# Patient Record
Sex: Male | Born: 1950
Health system: Southern US, Community
[De-identification: ages and names within clinical notes are randomized; demographics above are authoritative.]

## PROBLEM LIST (undated history)

## (undated) DIAGNOSIS — I219 Acute myocardial infarction, unspecified: Secondary | ICD-10-CM

## (undated) DIAGNOSIS — G4730 Sleep apnea, unspecified: Secondary | ICD-10-CM

## (undated) DIAGNOSIS — N189 Chronic kidney disease, unspecified: Secondary | ICD-10-CM

## (undated) DIAGNOSIS — R911 Solitary pulmonary nodule: Secondary | ICD-10-CM

## (undated) DIAGNOSIS — I5022 Chronic systolic (congestive) heart failure: Secondary | ICD-10-CM

## (undated) DIAGNOSIS — I10 Essential (primary) hypertension: Secondary | ICD-10-CM

## (undated) DIAGNOSIS — E7800 Pure hypercholesterolemia, unspecified: Secondary | ICD-10-CM

## (undated) DIAGNOSIS — J449 Chronic obstructive pulmonary disease, unspecified: Secondary | ICD-10-CM

## (undated) DIAGNOSIS — I2510 Atherosclerotic heart disease of native coronary artery without angina pectoris: Secondary | ICD-10-CM

## (undated) DIAGNOSIS — H409 Unspecified glaucoma: Secondary | ICD-10-CM

## (undated) DIAGNOSIS — E119 Type 2 diabetes mellitus without complications: Secondary | ICD-10-CM

## (undated) DIAGNOSIS — K219 Gastro-esophageal reflux disease without esophagitis: Secondary | ICD-10-CM

## (undated) HISTORY — DX: Chronic kidney disease, unspecified: N18.9

## (undated) HISTORY — DX: Chronic systolic (congestive) heart failure: I50.22

## (undated) HISTORY — PX: BOWEL RESECTION: SHX1257

## (undated) HISTORY — PX: SHOULDER ARTHROSCOPY: SHX128

## (undated) HISTORY — PX: NASAL SEPTUM SURGERY: SHX37

## (undated) HISTORY — PX: HAND SURGERY: SHX662

## (undated) HISTORY — DX: Solitary pulmonary nodule: R91.1

## (undated) HISTORY — DX: Gastro-esophageal reflux disease without esophagitis: K21.9

## (undated) HISTORY — PX: CARDIAC CATHETERIZATION: SHX172

---

## 2008-09-22 ENCOUNTER — Encounter: Admission: RE | Admit: 2008-09-22 | Discharge: 2008-10-08 | Payer: Self-pay | Admitting: Orthopedic Surgery

## 2008-10-09 ENCOUNTER — Encounter: Admission: RE | Admit: 2008-10-09 | Discharge: 2009-01-06 | Payer: Self-pay | Admitting: Orthopedic Surgery

## 2008-11-11 ENCOUNTER — Inpatient Hospital Stay (HOSPITAL_COMMUNITY): Admission: EM | Admit: 2008-11-11 | Discharge: 2008-11-14 | Payer: Self-pay | Admitting: Emergency Medicine

## 2008-11-13 ENCOUNTER — Encounter (INDEPENDENT_AMBULATORY_CARE_PROVIDER_SITE_OTHER): Payer: Self-pay | Admitting: Cardiovascular Disease

## 2009-01-31 ENCOUNTER — Encounter: Payer: Self-pay | Admitting: Cardiology

## 2009-11-30 ENCOUNTER — Encounter: Payer: Self-pay | Admitting: Cardiology

## 2009-12-01 ENCOUNTER — Ambulatory Visit: Payer: Self-pay | Admitting: Cardiology

## 2010-01-21 ENCOUNTER — Ambulatory Visit: Admit: 2010-01-21 | Payer: Self-pay | Admitting: Cardiology

## 2010-01-21 ENCOUNTER — Encounter: Payer: Self-pay | Admitting: Cardiology

## 2010-01-21 DIAGNOSIS — R55 Syncope and collapse: Secondary | ICD-10-CM | POA: Insufficient documentation

## 2010-01-21 DIAGNOSIS — Z87891 Personal history of nicotine dependence: Secondary | ICD-10-CM | POA: Insufficient documentation

## 2010-01-21 DIAGNOSIS — J449 Chronic obstructive pulmonary disease, unspecified: Secondary | ICD-10-CM | POA: Insufficient documentation

## 2010-01-21 DIAGNOSIS — I152 Hypertension secondary to endocrine disorders: Secondary | ICD-10-CM | POA: Insufficient documentation

## 2010-01-21 DIAGNOSIS — Z720 Tobacco use: Secondary | ICD-10-CM | POA: Insufficient documentation

## 2010-01-21 DIAGNOSIS — I252 Old myocardial infarction: Secondary | ICD-10-CM | POA: Insufficient documentation

## 2010-01-21 DIAGNOSIS — E119 Type 2 diabetes mellitus without complications: Secondary | ICD-10-CM | POA: Insufficient documentation

## 2010-01-21 DIAGNOSIS — I2510 Atherosclerotic heart disease of native coronary artery without angina pectoris: Secondary | ICD-10-CM | POA: Insufficient documentation

## 2010-01-21 DIAGNOSIS — I25110 Atherosclerotic heart disease of native coronary artery with unstable angina pectoris: Secondary | ICD-10-CM | POA: Insufficient documentation

## 2010-01-21 DIAGNOSIS — J4489 Other specified chronic obstructive pulmonary disease: Secondary | ICD-10-CM | POA: Insufficient documentation

## 2010-01-21 DIAGNOSIS — E1159 Type 2 diabetes mellitus with other circulatory complications: Secondary | ICD-10-CM | POA: Insufficient documentation

## 2010-01-21 DIAGNOSIS — I10 Essential (primary) hypertension: Secondary | ICD-10-CM | POA: Insufficient documentation

## 2010-01-21 DIAGNOSIS — E118 Type 2 diabetes mellitus with unspecified complications: Secondary | ICD-10-CM | POA: Insufficient documentation

## 2010-02-10 NOTE — Medication Information (Signed)
Summary: [facility] D/C MEDICATION DISCHARGE ORDERS  [facility] D/[facility] MEDICATION DISCHARGE ORDERS   Imported By: Zachary George 01/21/2010 09:39:50  _____________________________________________________________________  External Attachment:    Type:   Image     Comment:   External Document

## 2010-02-10 NOTE — Letter (Signed)
Summary: [facility] D/C DR[facility]  [facility] D[facility]C DR[facility]   Imported By: Zachary George 01/21/2010 09:40:40  _____________________________________________________________________  External Attachment:    Type:   Image     Comment:   External Document

## 2010-04-13 LAB — CBC
HCT: 37.9 % — ABNORMAL LOW (ref 39.0–52.0)
HCT: 47.6 % (ref 39.0–52.0)
Hemoglobin: 13.2 g/dL (ref 13.0–17.0)
Hemoglobin: 14 g/dL (ref 13.0–17.0)
MCHC: 34.9 g/dL (ref 30.0–36.0)
MCHC: 34.9 g/dL (ref 30.0–36.0)
MCV: 95.1 fL (ref 78.0–100.0)
Platelets: 201 10*3/uL (ref 150–400)
RBC: 4.01 MIL/uL — ABNORMAL LOW (ref 4.22–5.81)
RBC: 4.24 MIL/uL (ref 4.22–5.81)
RDW: 13.5 % (ref 11.5–15.5)
RDW: 13.6 % (ref 11.5–15.5)
RDW: 13.8 % (ref 11.5–15.5)
WBC: 10 10*3/uL (ref 4.0–10.5)
WBC: 10.5 10*3/uL (ref 4.0–10.5)

## 2010-04-13 LAB — LIPID PANEL
HDL: 24 mg/dL — ABNORMAL LOW (ref >39)
Total CHOL/HDL Ratio: 6.5 RATIO
Triglycerides: 174 mg/dL — ABNORMAL HIGH (ref <150)
VLDL: 35 mg/dL (ref 0–40)

## 2010-04-13 LAB — GLUCOSE, CAPILLARY: Glucose-Capillary: 105 mg/dL — ABNORMAL HIGH (ref 70–99)

## 2010-04-13 LAB — DIFFERENTIAL
Basophils Absolute: 0.1 10*3/uL (ref 0.0–0.1)
Basophils Relative: 1 % (ref 0–1)
Eosinophils Absolute: 0.2 10*3/uL (ref 0.0–0.7)
Eosinophils Relative: 2 % (ref 0–5)
Neutrophils Relative %: 63 % (ref 43–77)

## 2010-04-13 LAB — COMPREHENSIVE METABOLIC PANEL
ALT: 17 U/L (ref 0–53)
AST: 30 U/L (ref 0–37)
Alkaline Phosphatase: 69 U/L (ref 39–117)
Calcium: 8.7 mg/dL (ref 8.4–10.5)
GFR calc Af Amer: >60 mL/min (ref >60)
Glucose, Bld: 102 mg/dL — ABNORMAL HIGH (ref 70–99)
Potassium: 3.8 mEq/L (ref 3.5–5.1)
Sodium: 141 mEq/L (ref 135–145)
Total Protein: 6.1 g/dL (ref 6.0–8.3)

## 2010-04-13 LAB — BASIC METABOLIC PANEL
BUN: 16 mg/dL (ref 6–23)
CO2: 25 mEq/L (ref 19–32)
Calcium: 8.1 mg/dL — ABNORMAL LOW (ref 8.4–10.5)
Chloride: 104 mEq/L (ref 96–112)
Creatinine, Ser: 1.13 mg/dL (ref 0.4–1.5)
GFR calc Af Amer: >60 mL/min (ref >60)
GFR calc non Af Amer: >60 mL/min (ref >60)
Glucose, Bld: 104 mg/dL — ABNORMAL HIGH (ref 70–99)
Glucose, Bld: 129 mg/dL — ABNORMAL HIGH (ref 70–99)
Potassium: 3.5 mEq/L (ref 3.5–5.1)
Sodium: 134 mEq/L — ABNORMAL LOW (ref 135–145)

## 2010-04-13 LAB — CARDIAC PANEL(CRET KIN+CKTOT+MB+TROPI)
CK, MB: 20.6 ng/mL — ABNORMAL HIGH (ref 0.3–4.0)
CK, MB: 8 ng/mL — ABNORMAL HIGH (ref 0.3–4.0)
Relative Index: 7.6 — ABNORMAL HIGH (ref 0.0–2.5)
Total CK: 105 U/L (ref 7–232)
Total CK: 202 U/L (ref 7–232)

## 2010-04-13 LAB — POCT CARDIAC MARKERS
CKMB, poc: 6.4 ng/mL (ref 1.0–8.0)
Myoglobin, poc: 335 ng/mL (ref 12–200)
Troponin i, poc: 0.2 ng/mL — ABNORMAL HIGH (ref 0.00–0.09)

## 2010-04-13 LAB — BRAIN NATRIURETIC PEPTIDE: Pro B Natriuretic peptide (BNP): 61 pg/mL (ref 0.0–100.0)

## 2010-04-13 LAB — PROTIME-INR
INR: 1.11 (ref 0.00–1.49)
Prothrombin Time: 14.2 seconds (ref 11.6–15.2)

## 2010-04-13 LAB — CK TOTAL AND CKMB (NOT AT ARMC)
CK, MB: 11.9 ng/mL — ABNORMAL HIGH (ref 0.3–4.0)
Relative Index: 9.6 — ABNORMAL HIGH (ref 0.0–2.5)

## 2010-04-13 LAB — MAGNESIUM: Magnesium: 2.2 mg/dL (ref 1.5–2.5)

## 2010-04-13 LAB — TSH: TSH: 2.878 u[IU]/mL (ref 0.350–4.500)

## 2011-03-27 DIAGNOSIS — L089 Local infection of the skin and subcutaneous tissue, unspecified: Secondary | ICD-10-CM | POA: Insufficient documentation

## 2013-06-10 ENCOUNTER — Inpatient Hospital Stay (HOSPITAL_COMMUNITY): Admission: RE | Admit: 2013-06-10 | Payer: Self-pay | Source: Ambulatory Visit

## 2013-06-24 NOTE — Patient Instructions (Addendum)
Your procedure is scheduled on: 07/01/2013  [facility] at  930  AM.  Call this number if you have problems the morning of surgery: [phone]   Do not eat food or drink liquids :After Midnight.      Take these medicines the morning of surgery with A SIP OF WATER: Interview nurse will call you about which medicines to take morning of surgery once the pharmacy technician calls you to get your list of medications.   Do not wear jewelry, make-up or nail polish.  Do not wear lotions, powders, or perfumes.   Do not shave 48 hours prior to surgery.  Do not bring valuables to the hospital.  Contacts, dentures or bridgework may not be worn into surgery.  Leave suitcase in the car. After surgery it may be brought to your room.  For patients admitted to the hospital, checkout time is 11:00 AM the day of discharge.   Patients discharged the day of surgery will not be allowed to drive home.  :     Please read over the following fact sheets that you were given: Coughing and Deep Breathing, Surgical Site Infection Prevention, Anesthesia Post-op Instructions and Care and Recovery After Surgery    Cataract A cataract is a clouding of the lens of the eye. When a lens becomes cloudy, vision is reduced based on the degree and nature of the clouding. Many cataracts reduce vision to some degree. Some cataracts make people more near-sighted as they develop. Other cataracts increase glare. Cataracts that are ignored and become worse can sometimes look white. The white color can be seen through the pupil. CAUSES   Aging. However, cataracts may occur at any age, even in newborns.   Certain drugs.   Trauma to the eye.   Certain diseases such as diabetes.   Specific eye diseases such as chronic inflammation inside the eye or a sudden attack of a rare form of glaucoma.   Inherited or acquired medical problems.  SYMPTOMS   Gradual, progressive drop in vision in the affected eye.   Severe, rapid  visual loss. This most often happens when trauma is the cause.  DIAGNOSIS  To detect a cataract, an eye doctor examines the lens. Cataracts are best diagnosed with an exam of the eyes with the pupils enlarged (dilated) by drops.  TREATMENT  For an early cataract, vision may improve by using different eyeglasses or stronger lighting. If that does not help your vision, surgery is the only effective treatment. A cataract needs to be surgically removed when vision loss interferes with your everyday activities, such as driving, reading, or watching TV. A cataract may also have to be removed if it prevents examination or treatment of another eye problem. Surgery removes the cloudy lens and usually replaces it with a substitute lens (intraocular lens, IOL).  At a time when both you and your doctor agree, the cataract will be surgically removed. If you have cataracts in both eyes, only one is usually removed at a time. This allows the operated eye to heal and be out of danger from any possible problems after surgery (such as infection or poor wound healing). In rare cases, a cataract may be doing damage to your eye. In these cases, your caregiver may advise surgical removal right away. The vast majority of people who have cataract surgery have better vision afterward. HOME CARE INSTRUCTIONS  If you are not planning surgery, you may be asked to do the following:  Use  different eyeglasses.   Use stronger or brighter lighting.   Ask your eye doctor about reducing your medicine dose or changing medicines if it is thought that a medicine caused your cataract. Changing medicines does not make the cataract go away on its own.   Become familiar with your surroundings. Poor vision can lead to injury. Avoid bumping into things on the affected side. You are at a higher risk for tripping or falling.   Exercise extreme care when driving or operating machinery.   Wear sunglasses if you are sensitive to bright light or  experiencing problems with glare.  SEEK IMMEDIATE MEDICAL CARE IF:   You have a worsening or sudden vision loss.   You notice redness, swelling, or increasing pain in the eye.   You have a fever.  Document Released: 12/26/2004 Document Revised: 12/15/2010 Document Reviewed: 08/19/2010 [facility] Patient Information 2012 [place].PATIENT INSTRUCTIONS POST-ANESTHESIA  IMMEDIATELY FOLLOWING SURGERY:  Do not drive or operate machinery for the first twenty four hours after surgery.  Do not make any important decisions for twenty four hours after surgery or while taking narcotic pain medications or sedatives.  If you develop intractable nausea and vomiting or a severe headache please notify your doctor immediately.  FOLLOW-UP:  Please make an appointment with your surgeon as instructed. You do not need to follow up with anesthesia unless specifically instructed to do so.  WOUND CARE INSTRUCTIONS (if applicable):  Keep a dry clean dressing on the anesthesia/puncture wound site if there is drainage.  Once the wound has quit draining you may leave it open to air.  Generally you should leave the bandage intact for twenty four hours unless there is drainage.  If the epidural site drains for more than 36-48 hours please call the anesthesia department.  QUESTIONS?:  Please feel free to call your physician or the hospital operator if you have any questions, and they will be happy to assist you.

## 2013-06-26 ENCOUNTER — Encounter (HOSPITAL_COMMUNITY): Payer: Self-pay | Admitting: Pharmacy Technician

## 2013-06-26 ENCOUNTER — Other Ambulatory Visit: Payer: Self-pay

## 2013-06-26 ENCOUNTER — Encounter (HOSPITAL_COMMUNITY)
Admission: RE | Admit: 2013-06-26 | Discharge: 2013-06-26 | Disposition: A | Discharge status: Home / Self Care | Payer: Medicare HMO | Source: Ambulatory Visit | Attending: Ophthalmology | Admitting: Ophthalmology

## 2013-06-26 ENCOUNTER — Encounter (HOSPITAL_COMMUNITY): Payer: Self-pay

## 2013-06-26 DIAGNOSIS — Z01812 Encounter for preprocedural laboratory examination: Secondary | ICD-10-CM | POA: Insufficient documentation

## 2013-06-26 DIAGNOSIS — Z01810 Encounter for preprocedural cardiovascular examination: Secondary | ICD-10-CM | POA: Insufficient documentation

## 2013-06-26 HISTORY — DX: Atherosclerotic heart disease of native coronary artery without angina pectoris: I25.10

## 2013-06-26 HISTORY — DX: Unspecified glaucoma: H40.9

## 2013-06-26 HISTORY — DX: Acute myocardial infarction, unspecified: I21.9

## 2013-06-26 HISTORY — DX: Chronic obstructive pulmonary disease, unspecified: J44.9

## 2013-06-26 HISTORY — DX: Type 2 diabetes mellitus without complications: E11.9

## 2013-06-26 HISTORY — DX: Essential (primary) hypertension: I10

## 2013-06-26 HISTORY — DX: Sleep apnea, unspecified: G47.30

## 2013-06-26 HISTORY — DX: Pure hypercholesterolemia, unspecified: E78.00

## 2013-06-26 LAB — BASIC METABOLIC PANEL
BUN: 17 mg/dL (ref 6–23)
CALCIUM: 9.2 mg/dL (ref 8.4–10.5)
CO2: 27 mEq/L (ref 19–32)
Chloride: 101 mEq/L (ref 96–112)
Creatinine, Ser: 1.04 mg/dL (ref 0.50–1.35)
GFR calc Af Amer: 86 mL/min — ABNORMAL LOW (ref >90)
GFR, EST NON AFRICAN AMERICAN: 74 mL/min — AB (ref >90)
GLUCOSE: 214 mg/dL — AB (ref 70–99)
Potassium: 4.6 mEq/L (ref 3.7–5.3)
Sodium: 140 mEq/L (ref 137–147)

## 2013-06-26 LAB — HEMOGLOBIN AND HEMATOCRIT, BLOOD
HCT: 43.1 % (ref 39.0–52.0)
Hemoglobin: 14.6 g/dL (ref 13.0–17.0)

## 2013-06-26 NOTE — Progress Notes (Signed)
06/26/13 1055  OBSTRUCTIVE SLEEP APNEA  Have you ever been diagnosed with sleep apnea through a sleep study? No (Pt has never had a sleep study, but pt said a [facility] doctor told him once they thought he had sleep apnea)  Do you snore loudly (loud enough to be heard through closed doors)?  1  Do you often feel tired, fatigued, or sleepy during the daytime? 1  Has anyone observed you stop breathing during your sleep? 1  Do you have, or are you being treated for high blood pressure? 1  BMI more than 35 kg/m2? 1  Age over 65 years old? 1  Neck circumference greater than 40 cm/16 inches? 0  Gender: 1  Obstructive Sleep Apnea Score 7  Score 4 or greater  Results sent to PCP

## 2013-07-01 ENCOUNTER — Encounter (HOSPITAL_COMMUNITY)
Admission: RE | Disposition: A | Discharge status: Home / Self Care | Payer: Self-pay | Source: Ambulatory Visit | Attending: Ophthalmology

## 2013-07-01 ENCOUNTER — Encounter (HOSPITAL_COMMUNITY): Payer: Medicare HMO | Admitting: Anesthesiology

## 2013-07-01 ENCOUNTER — Ambulatory Visit (HOSPITAL_COMMUNITY): Payer: Medicare HMO | Admitting: Anesthesiology

## 2013-07-01 ENCOUNTER — Ambulatory Visit (HOSPITAL_COMMUNITY)
Admission: RE | Admit: 2013-07-01 | Discharge: 2013-07-01 | Disposition: A | Discharge status: Home / Self Care | Payer: Medicare HMO | Source: Ambulatory Visit | Attending: Ophthalmology | Admitting: Ophthalmology

## 2013-07-01 ENCOUNTER — Encounter (HOSPITAL_COMMUNITY): Payer: Self-pay | Admitting: *Deleted

## 2013-07-01 DIAGNOSIS — Z79899 Other long term (current) drug therapy: Secondary | ICD-10-CM | POA: Insufficient documentation

## 2013-07-01 DIAGNOSIS — Z794 Long term (current) use of insulin: Secondary | ICD-10-CM | POA: Insufficient documentation

## 2013-07-01 DIAGNOSIS — E119 Type 2 diabetes mellitus without complications: Secondary | ICD-10-CM | POA: Insufficient documentation

## 2013-07-01 DIAGNOSIS — J4489 Other specified chronic obstructive pulmonary disease: Secondary | ICD-10-CM | POA: Insufficient documentation

## 2013-07-01 DIAGNOSIS — I10 Essential (primary) hypertension: Secondary | ICD-10-CM | POA: Insufficient documentation

## 2013-07-01 DIAGNOSIS — Z87891 Personal history of nicotine dependence: Secondary | ICD-10-CM | POA: Insufficient documentation

## 2013-07-01 DIAGNOSIS — Z7982 Long term (current) use of aspirin: Secondary | ICD-10-CM | POA: Insufficient documentation

## 2013-07-01 DIAGNOSIS — J449 Chronic obstructive pulmonary disease, unspecified: Secondary | ICD-10-CM | POA: Insufficient documentation

## 2013-07-01 DIAGNOSIS — H2510 Age-related nuclear cataract, unspecified eye: Secondary | ICD-10-CM | POA: Insufficient documentation

## 2013-07-01 HISTORY — PX: CATARACT EXTRACTION W/PHACO: SHX586

## 2013-07-01 LAB — GLUCOSE, CAPILLARY: Glucose-Capillary: 249 mg/dL — ABNORMAL HIGH (ref 70–99)

## 2013-07-01 SURGERY — PHACOEMULSIFICATION, CATARACT, WITH IOL INSERTION
Anesthesia: Monitor Anesthesia Care | Site: Eye | Laterality: Left

## 2013-07-01 MED ORDER — TETRACAINE HCL 0.5 % OP SOLN
1.0000 [drp] | OPHTHALMIC | Status: AC
  Administered 2013-07-01 (×3): 1 [drp] via OPHTHALMIC

## 2013-07-01 MED ORDER — CYCLOPENTOLATE-PHENYLEPHRINE OP SOLN OPTIME - NO CHARGE
OPHTHALMIC | Status: AC
  Filled 2013-07-01: qty 2

## 2013-07-01 MED ORDER — FENTANYL CITRATE 0.05 MG/ML IJ SOLN
INTRAMUSCULAR | Status: AC
  Filled 2013-07-01: qty 2

## 2013-07-01 MED ORDER — FENTANYL CITRATE 0.05 MG/ML IJ SOLN
25.0000 ug | INTRAMUSCULAR | Status: AC
  Administered 2013-07-01 (×2): 25 ug via INTRAVENOUS

## 2013-07-01 MED ORDER — MIDAZOLAM HCL 2 MG/2ML IJ SOLN
1.0000 mg | INTRAMUSCULAR | Status: DC | PRN
  Administered 2013-07-01: 2 mg via INTRAVENOUS

## 2013-07-01 MED ORDER — KETOROLAC TROMETHAMINE 0.5 % OP SOLN
OPHTHALMIC | Status: AC
  Filled 2013-07-01: qty 5

## 2013-07-01 MED ORDER — LACTATED RINGERS IV SOLN
INTRAVENOUS | Status: DC
  Administered 2013-07-01: 11:00:00 via INTRAVENOUS

## 2013-07-01 MED ORDER — BSS IO SOLN
INTRAOCULAR | Status: DC | PRN
  Administered 2013-07-01: 11:00:00

## 2013-07-01 MED ORDER — CYCLOPENTOLATE-PHENYLEPHRINE 0.2-1 % OP SOLN
1.0000 [drp] | OPHTHALMIC | Status: AC
  Administered 2013-07-01 (×3): 1 [drp] via OPHTHALMIC

## 2013-07-01 MED ORDER — PROVISC 10 MG/ML IO SOLN
INTRAOCULAR | Status: DC | PRN
  Administered 2013-07-01: 0.85 mL via INTRAOCULAR

## 2013-07-01 MED ORDER — MIDAZOLAM HCL 2 MG/2ML IJ SOLN
INTRAMUSCULAR | Status: AC
  Filled 2013-07-01: qty 2

## 2013-07-01 MED ORDER — TETRACAINE 0.5 % OP SOLN OPTIME - NO CHARGE
OPHTHALMIC | Status: DC | PRN
  Administered 2013-07-01: 2 [drp] via OPHTHALMIC

## 2013-07-01 MED ORDER — PHENYLEPHRINE HCL 2.5 % OP SOLN
1.0000 [drp] | OPHTHALMIC | Status: AC
  Administered 2013-07-01 (×3): 1 [drp] via OPHTHALMIC

## 2013-07-01 MED ORDER — TETRACAINE HCL 0.5 % OP SOLN
OPHTHALMIC | Status: AC
  Filled 2013-07-01: qty 2

## 2013-07-01 MED ORDER — KETOROLAC TROMETHAMINE 0.5 % OP SOLN
1.0000 [drp] | OPHTHALMIC | Status: AC
  Administered 2013-07-01 (×3): 1 [drp] via OPHTHALMIC

## 2013-07-01 MED ORDER — PHENYLEPHRINE HCL 2.5 % OP SOLN
OPHTHALMIC | Status: AC
  Filled 2013-07-01: qty 15

## 2013-07-01 MED ORDER — BSS IO SOLN
INTRAOCULAR | Status: DC | PRN
Start: 2013-07-01 — End: 2013-07-01
  Administered 2013-07-01: 15 mL via INTRAOCULAR

## 2013-07-01 MED ORDER — EPINEPHRINE HCL 1 MG/ML IJ SOLN
INTRAMUSCULAR | Status: AC
  Filled 2013-07-01: qty 1

## 2013-07-01 SURGICAL SUPPLY — 25 items
CAPSULAR TENSION RING-AMO (OPHTHALMIC RELATED) IMPLANT
CLOTH BEACON ORANGE TIMEOUT ST (SAFETY) ×2 IMPLANT
EYE SHIELD UNIVERSAL CLEAR (GAUZE/BANDAGES/DRESSINGS) ×2 IMPLANT
GLOVE BIO SURGEON STRL SZ 6.5 (GLOVE) ×2 IMPLANT
GLOVE BIOGEL PI IND STRL 6.5 (GLOVE) ×1 IMPLANT
GLOVE BIOGEL PI INDICATOR 6.5 (GLOVE) ×1
GLOVE ECLIPSE 6.5 STRL STRAW (GLOVE) IMPLANT
GLOVE ECLIPSE 7.0 STRL STRAW (GLOVE) IMPLANT
GLOVE EXAM NITRILE LRG STRL (GLOVE) IMPLANT
GLOVE EXAM NITRILE MD LF STRL (GLOVE) IMPLANT
GLOVE SKINSENSE NS SZ6.5 (GLOVE)
GLOVE SKINSENSE STRL SZ6.5 (GLOVE) IMPLANT
HEALON 5 0.6 ML (INTRAOCULAR LENS) IMPLANT
KIT VITRECTOMY (OPHTHALMIC RELATED) IMPLANT
PAD ARMBOARD 7.5X6 YLW CONV (MISCELLANEOUS) ×2 IMPLANT
PROC W NO LENS (INTRAOCULAR LENS)
PROC W SPEC LENS (INTRAOCULAR LENS)
PROCESS W NO LENS (INTRAOCULAR LENS) IMPLANT
PROCESS W SPEC LENS (INTRAOCULAR LENS) IMPLANT
RING MALYGIN (MISCELLANEOUS) IMPLANT
SIGHTPATH CAT PROC W REG LENS (Ophthalmic Related) ×2 IMPLANT
TAPE SURG TRANSPORE 1 IN (GAUZE/BANDAGES/DRESSINGS) ×1 IMPLANT
TAPE SURGICAL TRANSPORE 1 IN (GAUZE/BANDAGES/DRESSINGS) ×1
VISCOELASTIC ADDITIONAL (OPHTHALMIC RELATED) IMPLANT
WATER STERILE IRR 250ML POUR (IV SOLUTION) ×2 IMPLANT

## 2013-07-01 NOTE — H&P (Signed)
The patient was re examined and there is no change in the patients condition since the original H and P. 

## 2013-07-01 NOTE — Anesthesia Preprocedure Evaluation (Signed)
Anesthesia Evaluation  Patient identified by MRN, date of birth, ID band Patient awake    Airway Mallampati: III TM Distance: >3 FB     Dental  (+) Poor Dentition, Missing   Pulmonary sleep apnea , COPDformer smoker,  breath sounds clear to auscultation        Cardiovascular hypertension, Pt. on home beta blockers and Pt. on medications + CAD and + Past MI Rhythm:Regular Rate:Normal     Neuro/Psych    GI/Hepatic   Endo/Other  diabetes, Poorly Controlled, Type 2, Insulin Dependent  Renal/GU      Musculoskeletal   Abdominal   Peds  Hematology   Anesthesia Other Findings   Reproductive/Obstetrics                           Anesthesia Physical Anesthesia Plan  ASA: III  Anesthesia Plan: MAC   Post-op Pain Management:    Induction: Intravenous  Airway Management Planned: Nasal Cannula  Additional Equipment:   Intra-op Plan:   Post-operative Plan:   Informed Consent: I have reviewed the patients History and Physical, chart, labs and discussed the procedure including the risks, benefits and alternatives for the proposed anesthesia with the patient or authorized representative who has indicated his/her understanding and acceptance.     Plan Discussed with:   Anesthesia Plan Comments:         Anesthesia Quick Evaluation

## 2013-07-01 NOTE — Transfer of Care (Signed)
Immediate Anesthesia Transfer of Care Note  Patient: Gregory Cole  Procedure(s) Performed: Procedure(s) with comments: CATARACT EXTRACTION PHACO AND INTRAOCULAR LENS PLACEMENT (IOC) (Left) - CDE 6.84  Patient Location: Short Stay  Anesthesia Type:MAC  Level of Consciousness: awake, alert , oriented and patient cooperative  Airway & Oxygen Therapy: Patient Spontanous Breathing  Post-op Assessment: Report given to PACU RN and Post -op Vital signs reviewed and stable  Post vital signs: Reviewed and stable  Complications: No apparent anesthesia complications

## 2013-07-01 NOTE — Anesthesia Postprocedure Evaluation (Signed)
  Anesthesia Post-op Note  Patient: Gregory OleaLarry B Tondreau  Procedure(s) Performed: Procedure(s) with comments: CATARACT EXTRACTION PHACO AND INTRAOCULAR LENS PLACEMENT (IOC) (Left) - CDE 6.84  Patient Location: Short Stay  Anesthesia Type:MAC  Level of Consciousness: awake, alert , oriented and patient cooperative  Airway and Oxygen Therapy: Patient Spontanous Breathing  Post-op Pain: none  Post-op Assessment: Post-op Vital signs reviewed, Patient's Cardiovascular Status Stable, Respiratory Function Stable and Patent Airway  Post-op Vital Signs: Reviewed and stable  Last Vitals:  Filed Vitals:   07/01/13 1050  BP: 107/73  Temp:   Resp: 24    Complications: No apparent anesthesia complications

## 2013-07-01 NOTE — Discharge Instructions (Signed)
Cataract Surgery °Care After °Refer to this sheet in the next few weeks. These instructions provide you with information on caring for yourself after your procedure. Your caregiver may also give you more specific instructions. Your treatment has been planned according to current medical practices, but problems sometimes occur. Call your caregiver if you have any problems or questions after your procedure.  °HOME CARE INSTRUCTIONS  °· Avoid strenuous activities as directed by your caregiver. °· Ask your caregiver when you can resume driving. °· Use eyedrops or other medicines to help healing and control pressure inside your eye as directed by your caregiver. °· Only take over-the-counter or prescription medicines for pain, discomfort, or fever as directed by your caregiver. °· Do not to touch or rub your eyes. °· You may be instructed to use a protective shield during the first few days and nights after surgery. If not, wear sunglasses to protect your eyes. This is to protect the eye from pressure or from being accidentally bumped. °· Keep the area around your eye clean and dry. Avoid swimming or allowing water to hit you directly in the face while showering. Keep soap and shampoo out of your eyes. °· Do not bend or lift heavy objects. Bending increases pressure in the eye. You can walk, climb stairs, and do light household chores. °· Do not put a contact lens into the eye that had surgery until your caregiver says it is okay to do so. °· Ask your doctor when you can return to work. This will depend on the kind of work that you do. If you work in a dusty environment, you may be advised to wear protective eyewear for a period of time. °· Ask your caregiver when it will be safe to engage in sexual activity. °· Continue with your regular eye exams as directed by your caregiver. °What to expect: °· It is normal to feel itching and mild discomfort for a few days after cataract surgery. Some fluid discharge is also common,  and your eye may be sensitive to light and touch. °· After 1 to 2 days, even moderate discomfort should disappear. In most cases, healing will take about 6 weeks. °· If you received an intraocular lens (IOL), you may notice that colors are very bright or have a blue tinge. Also, if you have been in bright sunlight, everything may appear reddish for a few hours. If you see these color tinges, it is because your lens is clear and no longer cloudy. Within a few months after receiving an IOL, these extra colors should go away. When you have healed, you will probably need new glasses. °SEEK MEDICAL CARE IF:  °· You have increased bruising around your eye. °· You have discomfort not helped by medicine. °SEEK IMMEDIATE MEDICAL CARE IF:  °· You have a  fever. °· You have a worsening or sudden vision loss. °· You have redness, swelling, or increasing pain in the eye. °· You have a thick discharge from the eye that had surgery. °MAKE SURE YOU: °· Understand these instructions. °· Will watch your condition. °· Will get help right away if you are not doing well or get worse. °Document Released: 07/15/2004 Document Revised: 03/20/2011 Document Reviewed: 08/19/2010 °ExitCare® Patient Information ©2015 [employer]. This information is not intended to replace advice given to you by your health care provider. Make sure you discuss any questions you have with your health care provider. ° °

## 2013-07-01 NOTE — Op Note (Signed)
Patient brought to the operating room and prepped and draped in the usual manner.  Lid speculum inserted in left eye.  Stab incision made at the twelve o'clock position.  Provisc instilled in the anterior chamber.   A 2.4 mm. Stab incision was made temporally.  An anterior capsulotomy was done with a bent 25 gauge needle.  The nucleus was hydrodissected.  The Phaco tip was inserted in the anterior chamber and the nucleus was emulsified.  CDE was 6.84.  The cortical material was then removed with the I and A tip.  Posterior capsule was the polished.  The anterior chamber was deepened with Provisc.  A 23.0 Diopter Rayner 570C IOL was then inserted in the capsular bag.  Provisc was then removed with the I and A tip.  The wound was then hydrated.  Patient sent to the Recovery Room in good condition with follow up in my office.  Preoperative Diagnosis:  Nuclear Cataract OS Postoperative Diagnosis:  Same Procedure name: Kelman Phacoemulsification OS with IOL

## 2013-07-02 ENCOUNTER — Encounter (HOSPITAL_COMMUNITY): Payer: Self-pay | Admitting: Ophthalmology

## 2013-07-04 ENCOUNTER — Other Ambulatory Visit: Payer: Self-pay | Admitting: Family Medicine

## 2013-07-04 DIAGNOSIS — G4730 Sleep apnea, unspecified: Secondary | ICD-10-CM

## 2013-08-26 ENCOUNTER — Institutional Professional Consult (permissible substitution): Payer: Medicare HMO | Admitting: Internal Medicine

## 2014-04-21 ENCOUNTER — Ambulatory Visit (INDEPENDENT_AMBULATORY_CARE_PROVIDER_SITE_OTHER): Payer: Medicare HMO | Admitting: Family

## 2014-04-21 ENCOUNTER — Encounter: Payer: Self-pay | Admitting: Family

## 2014-04-21 VITALS — BP 96/60 | HR 67 | Temp 97.8°F | Ht 67.5 in | Wt 199.6 lb

## 2014-04-21 DIAGNOSIS — J069 Acute upper respiratory infection, unspecified: Secondary | ICD-10-CM | POA: Diagnosis not present

## 2014-04-21 MED ORDER — AZITHROMYCIN 250 MG PO TABS: ORAL_TABLET | ORAL | Status: DC

## 2014-04-21 MED ORDER — METHYLPREDNISOLONE (PAK) 4 MG PO TABS: ORAL_TABLET | ORAL | Status: DC

## 2014-04-21 MED ORDER — BENZONATATE 200 MG PO CAPS: 200.0000 mg | ORAL_CAPSULE | Freq: Three times a day (TID) | ORAL | Status: DC | PRN

## 2014-04-21 MED ORDER — HYDROCODONE-HOMATROPINE 5-1.5 MG/5ML PO SYRP: 5.0000 mL | ORAL_SOLUTION | Freq: Three times a day (TID) | ORAL | Status: DC | PRN

## 2014-04-21 NOTE — Patient Instructions (Addendum)
Upper Respiratory Infection, Adult An upper respiratory infection (URI) is also sometimes known as the common cold. The upper respiratory tract includes the nose, sinuses, throat, trachea, and bronchi. Bronchi are the airways leading to the lungs. Most people improve within 1 week, but symptoms can last up to 2 weeks. A residual cough may last even longer.  CAUSES Many different viruses can infect the tissues lining the upper respiratory tract. The tissues become irritated and inflamed and often become very moist. Mucus production is also common. A cold is contagious. You can easily spread the virus to others by oral contact. This includes kissing, sharing a glass, coughing, or sneezing. Touching your mouth or nose and then touching a surface, which is then touched by another person, can also spread the virus. SYMPTOMS  Symptoms typically develop 1 to 3 days after you come in contact with a cold virus. Symptoms vary from person to person. They may include:  Runny nose.  Sneezing.  Nasal congestion.  Sinus irritation.  Sore throat.  Loss of voice (laryngitis).  Cough.  Fatigue.  Muscle aches.  Loss of appetite.  Headache.  Low-grade fever. DIAGNOSIS  You might diagnose your own cold based on familiar symptoms, since most people get a cold 2 to 3 times a year. Your caregiver can confirm this based on your exam. Most importantly, your caregiver can check that your symptoms are not due to another disease such as strep throat, sinusitis, pneumonia, asthma, or epiglottitis. Blood tests, throat tests, and X-rays are not necessary to diagnose a common cold, but they may sometimes be helpful in excluding other more serious diseases. Your caregiver will decide if any further tests are required. RISKS AND COMPLICATIONS  You may be at risk for a more severe case of the common cold if you smoke cigarettes, have chronic heart disease (such as heart failure) or lung disease (such as asthma), or if  you have a weakened immune system. The very young and very old are also at risk for more serious infections. Bacterial sinusitis, middle ear infections, and bacterial pneumonia can complicate the common cold. The common cold can worsen asthma and chronic obstructive pulmonary disease (COPD). Sometimes, these complications can require emergency medical care and may be life-threatening. PREVENTION  The best way to protect against getting a cold is to practice good hygiene. Avoid oral or hand contact with people with cold symptoms. Wash your hands often if contact occurs. There is no clear evidence that vitamin C, vitamin E, echinacea, or exercise reduces the chance of developing a cold. However, it is always recommended to get plenty of rest and practice good nutrition. TREATMENT  Treatment is directed at relieving symptoms. There is no cure. Antibiotics are not effective, because the infection is caused by a virus, not by bacteria. Treatment may include:  Increased fluid intake. Sports drinks offer valuable electrolytes, sugars, and fluids.  Breathing heated mist or steam (vaporizer or shower).  Eating chicken soup or other clear broths, and maintaining good nutrition.  Getting plenty of rest.  Using gargles or lozenges for comfort.  Controlling fevers with ibuprofen or acetaminophen as directed by your caregiver.  Increasing usage of your inhaler if you have asthma. Zinc gel and zinc lozenges, taken in the first 24 hours of the common cold, can shorten the duration and lessen the severity of symptoms. Pain medicines may help with fever, muscle aches, and throat pain. A variety of non-prescription medicines are available to treat congestion and runny nose. Your caregiver   can make recommendations and may suggest nasal or lung inhalers for other symptoms.  HOME CARE INSTRUCTIONS   Only take over-the-counter or prescription medicines for pain, discomfort, or fever as directed by your  caregiver.  Use a warm mist humidifier or inhale steam from a shower to increase air moisture. This may keep secretions moist and make it easier to breathe.  Drink enough water and fluids to keep your urine clear or pale yellow.  Rest as needed.  Return to work when your temperature has returned to normal or as your caregiver advises. You may need to stay home longer to avoid infecting others. You can also use a face mask and careful hand washing to prevent spread of the virus. SEEK MEDICAL CARE IF:   After the first few days, you feel you are getting worse rather than better.  You need your caregiver's advice about medicines to control symptoms.  You develop chills, worsening shortness of breath, or brown or red sputum. These may be signs of pneumonia.  You develop yellow or brown nasal discharge or pain in the face, especially when you bend forward. These may be signs of sinusitis.  You develop a fever, swollen neck glands, pain with swallowing, or white areas in the back of your throat. These may be signs of strep throat. SEEK IMMEDIATE MEDICAL CARE IF:   You have a fever.  You develop severe or persistent headache, ear pain, sinus pain, or chest pain.  You develop wheezing, a prolonged cough, cough up blood, or have a change in your usual mucus (if you have chronic lung disease).  You develop sore muscles or a stiff neck. Document Released: 06/21/2000 Document Revised: 03/20/2011 Document Reviewed: 04/02/2013 [employer] Patient Information 2015 [employer]. This information is not intended to replace advice given to you by your health care provider. Make sure you discuss any questions you have with your health care provider.  - Take meds as prescribed - Use a cool mist humidifier  -Use saline nose sprays frequently -Saline irrigations of the nose can be very helpful if done frequently.  * 4X daily for 1 week*  * Use of a nettie pot can be helpful with this. Follow  directions with this* -Force fluids -For any cough or congestion  Use plain Mucinex- regular strength or max strength is fine   * Children- consult with Pharmacist for dosing -For fever or aces or pains- take tylenol or ibuprofen appropriate for age and weight.  * for fevers greater than 101 orally you may alternate ibuprofen and tylenol every  3 hours. -Throat lozenges if help   Squire Withey, FNP   

## 2014-04-21 NOTE — Progress Notes (Signed)
Subjective:    Patient ID: Gregory Cole, male    DOB: Sep 04, 1950, 64 y.o.   MRN: [id number]  Cough This is a new problem. The current episode started 1 to 4 weeks ago (Two weeks ago). The problem has been waxing and waning. The problem occurs every few minutes. The cough is productive of sputum (Yellow green). Associated symptoms include ear congestion, headaches, a sore throat, shortness of breath and wheezing. Pertinent negatives include no chills, ear pain, fever, hemoptysis, nasal congestion or rhinorrhea. The symptoms are aggravated by lying down and pollens. He has tried rest and OTC cough suppressant for the symptoms. The treatment provided mild relief. His past medical history is significant for COPD.      Review of Systems  Constitutional: Negative.  Negative for fever and chills.  HENT: Positive for sore throat. Negative for ear pain and rhinorrhea.   Respiratory: Positive for cough, shortness of breath and wheezing. Negative for hemoptysis.   Cardiovascular: Negative.   Gastrointestinal: Negative.   Endocrine: Negative.   Genitourinary: Negative.   Musculoskeletal: Negative.   Neurological: Positive for headaches.  Hematological: Negative.   Psychiatric/Behavioral: Negative.   All other systems reviewed and are negative.      Objective:   Physical Exam  Constitutional: He is oriented to person, place, and time. He appears well-developed and well-nourished. No distress.  HENT:  Head: Normocephalic.  Right Ear: External ear normal.  Left Ear: External ear normal.  Nasal passage erythemas with moderate swelling- Right side greater than left   Oropharynx erythemas   Eyes: Pupils are equal, round, and reactive to light. Right eye exhibits no discharge. Left eye exhibits no discharge.  Neck: Normal range of motion. Neck supple. No thyromegaly present.  Cardiovascular: Normal rate, regular rhythm, normal heart sounds and intact distal pulses.   No murmur  heard. Pulmonary/Chest: Effort normal and breath sounds normal. No respiratory distress. He has no wheezes.  Productive cough  Abdominal: Soft. Bowel sounds are normal. He exhibits no distension. There is no tenderness.  Musculoskeletal: Normal range of motion. He exhibits no edema or tenderness.  Neurological: He is alert and oriented to person, place, and time. He has normal reflexes. No cranial nerve deficit.  Skin: Skin is warm and dry. No rash noted. No erythema.  Psychiatric: He has a normal mood and affect. His behavior is normal. Judgment and thought content normal.  Vitals reviewed.     BP 96/60 mmHg  Pulse 67  Temp(Src) 97.8 F (36.6 C) (Oral)  Ht 5' 7.5" (1.715 m)  Wt 199 lb 9.6 oz (90.538 kg)  BMI 30.78 kg/m2     Assessment & Plan:  1. Acute upper respiratory infection -- Take meds as prescribed - Use a cool mist humidifier  -Use saline nose sprays frequently -Saline irrigations of the nose can be very helpful if done frequently.  * 4X daily for 1 week*  * Use of a nettie pot can be helpful with this. Follow directions with this* -Force fluids -For any cough or congestion  Use plain Mucinex- regular strength or max strength is fine   * Children- consult with Pharmacist for dosing -For fever or aces or pains- take tylenol or ibuprofen appropriate for age and weight.  * for fevers greater than 101 orally you may alternate ibuprofen and tylenol every  3 hours. -Throat lozenges if help - azithromycin (ZITHROMAX) 250 MG tablet; Take 500 mg once, then 250 mg for four days  Dispense: 6 tablet; Refill:  0 - benzonatate (TESSALON) 200 MG capsule; Take 1 capsule (200 mg total) by mouth 3 (three) times daily as needed.  Dispense: 30 capsule; Refill: 1 - HYDROcodone-homatropine (HYCODAN) 5-1.5 MG/5ML syrup; Take 5 mLs by mouth every 8 (eight) hours as needed for cough.  Dispense: 120 mL; Refill: 0  Jannifer Rodneyhristy Hawks, FNP

## 2014-06-04 LAB — HM DIABETES EYE EXAM

## 2014-10-22 ENCOUNTER — Encounter: Payer: Self-pay | Admitting: *Deleted

## 2014-11-10 ENCOUNTER — Telehealth: Payer: Self-pay | Admitting: Family

## 2016-03-14 DIAGNOSIS — Z794 Long term (current) use of insulin: Secondary | ICD-10-CM | POA: Diagnosis not present

## 2016-03-14 DIAGNOSIS — H40009 Preglaucoma, unspecified, unspecified eye: Secondary | ICD-10-CM | POA: Diagnosis not present

## 2016-03-14 DIAGNOSIS — H9113 Presbycusis, bilateral: Secondary | ICD-10-CM | POA: Diagnosis not present

## 2016-03-14 DIAGNOSIS — K219 Gastro-esophageal reflux disease without esophagitis: Secondary | ICD-10-CM | POA: Diagnosis not present

## 2016-03-14 DIAGNOSIS — I252 Old myocardial infarction: Secondary | ICD-10-CM | POA: Diagnosis not present

## 2016-03-14 DIAGNOSIS — Z955 Presence of coronary angioplasty implant and graft: Secondary | ICD-10-CM | POA: Diagnosis not present

## 2016-03-14 DIAGNOSIS — I2510 Atherosclerotic heart disease of native coronary artery without angina pectoris: Secondary | ICD-10-CM | POA: Diagnosis not present

## 2016-03-14 DIAGNOSIS — E039 Hypothyroidism, unspecified: Secondary | ICD-10-CM | POA: Diagnosis not present

## 2016-03-14 DIAGNOSIS — Z87891 Personal history of nicotine dependence: Secondary | ICD-10-CM | POA: Diagnosis not present

## 2016-03-14 DIAGNOSIS — I10 Essential (primary) hypertension: Secondary | ICD-10-CM | POA: Diagnosis not present

## 2016-03-14 DIAGNOSIS — Z0000 Encounter for general adult medical examination without abnormal findings: Secondary | ICD-10-CM | POA: Diagnosis not present

## 2016-03-14 DIAGNOSIS — H2589 Other age-related cataract: Secondary | ICD-10-CM | POA: Diagnosis not present

## 2016-03-14 DIAGNOSIS — Z7982 Long term (current) use of aspirin: Secondary | ICD-10-CM | POA: Diagnosis not present

## 2016-03-14 DIAGNOSIS — E1141 Type 2 diabetes mellitus with diabetic mononeuropathy: Secondary | ICD-10-CM | POA: Diagnosis not present

## 2016-03-14 DIAGNOSIS — R600 Localized edema: Secondary | ICD-10-CM | POA: Diagnosis not present

## 2016-03-14 DIAGNOSIS — E785 Hyperlipidemia, unspecified: Secondary | ICD-10-CM | POA: Diagnosis not present

## 2016-12-06 DIAGNOSIS — R69 Illness, unspecified: Secondary | ICD-10-CM | POA: Diagnosis not present

## 2018-03-20 DIAGNOSIS — K668 Other specified disorders of peritoneum: Secondary | ICD-10-CM | POA: Insufficient documentation

## 2018-03-20 DIAGNOSIS — R109 Unspecified abdominal pain: Secondary | ICD-10-CM | POA: Insufficient documentation

## 2018-03-25 DIAGNOSIS — Z9889 Other specified postprocedural states: Secondary | ICD-10-CM | POA: Insufficient documentation

## 2018-04-04 DIAGNOSIS — N50811 Right testicular pain: Secondary | ICD-10-CM | POA: Insufficient documentation

## 2018-11-16 NOTE — Progress Notes (Signed)
New Patient Office Visit  Assessment & Plan:  1. Essential hypertension - Elevated today but patient has not had his medication yet this morning. Managed by the [place].  2. Gastroesophageal reflux disease, unspecified whether esophagitis present - Well controlled on current regimen. Managed by the [place].  3. Uncontrolled type 2 diabetes mellitus with hyperglycemia ([place]) - Managed by the [place].   4. Immunization due - SHINGRIX injection; Inject 0.5 mLs into the muscle once for 1 dose.  Dispense: 0.5 mL; Refill: 0   Follow-up: Return if symptoms worsen or fail to improve.   Hendricks Limes, MSN, APRN, FNP-C [facility]  Subjective:  Patient ID: Gregory Cole, male    DOB: 1950-07-01  Age: 68 y.o. MRN: [id number]  Patient Care Team: Loman Brooklyn, FNP as PCP - General (Family Medicine)  CC:  Chief Complaint  Patient presents with  . New Patient (Initial Visit)    HPI Gregory Cole presents to establish care. [facility] [place] [facility] but encouraged him to get established with PCP near home.   No complaints/concerns today.   Review of Systems  Constitutional: Negative for chills, fever, malaise/fatigue and weight loss.  HENT: Negative for congestion, ear discharge, ear pain, nosebleeds, sinus pain, sore throat and tinnitus.   Eyes: Negative for blurred vision, double vision, pain, discharge and redness.  Respiratory: Negative for cough, shortness of breath and wheezing.   Cardiovascular: Negative for chest pain, palpitations and leg swelling.  Gastrointestinal: Positive for heartburn. Negative for abdominal pain, constipation, diarrhea, nausea and vomiting.  Genitourinary: Negative for dysuria, frequency and urgency.  Musculoskeletal: Positive for joint pain. Negative for myalgias.  Skin: Negative for rash.  Neurological: Negative for dizziness, seizures, weakness and headaches.  Psychiatric/Behavioral: Negative for  depression, substance abuse and suicidal ideas. The patient is not nervous/anxious.     Current Outpatient Medications:  .  albuterol (PROVENTIL HFA) 108 (90 BASE) MCG/ACT inhaler, Inhale 2 puffs into the lungs every 6 (six) hours as needed for wheezing or shortness of breath., Disp: , Rfl:  .  aspirin EC 81 MG tablet, Take 81 mg by mouth daily., Disp: , Rfl:  .  atorvastatin (LIPITOR) 40 MG tablet, Take 40 mg by mouth daily., Disp: , Rfl:  .  furosemide (LASIX) 20 MG tablet, Take 20 mg by mouth daily., Disp: , Rfl:  .  insulin aspart protamine - aspart (NOVOLOG MIX 70/30 FLEXPEN) (70-30) 100 UNIT/ML FlexPen, Inject 36 Units into the skin daily., Disp: , Rfl:  .  isosorbide mononitrate (IMDUR) 120 MG 24 hr tablet, Take 120 mg by mouth daily., Disp: , Rfl:  .  levothyroxine (SYNTHROID, LEVOTHROID) 88 MCG tablet, Take 88 mcg by mouth daily before breakfast., Disp: , Rfl:  .  losartan (COZAAR) 100 MG tablet, Take 100 mg by mouth daily., Disp: , Rfl:  .  metFORMIN (GLUCOPHAGE) 500 MG tablet, Take 500 mg by mouth 2 (two) times daily., Disp: , Rfl:  .  metoprolol tartrate (LOPRESSOR) 25 MG tablet, Take 25 mg by mouth daily., Disp: , Rfl:  .  nitroGLYCERIN (NITROSTAT) 0.4 MG SL tablet, Place under the tongue., Disp: , Rfl:  .  Omega-3 Fatty Acids (FISH OIL PO), Take 1,000 mg by mouth 2 (two) times daily., Disp: , Rfl:  .  omeprazole (PRILOSEC) 20 MG capsule, Take 20 mg by mouth daily., Disp: , Rfl:  .  pioglitazone (ACTOS) 45 MG tablet, Take 45 mg by mouth daily.,  Disp: , Rfl:  .  SHINGRIX injection, Inject 0.5 mLs into the muscle once for 1 dose., Disp: 0.5 mL, Rfl: 0  No Known Allergies  Past Medical History:  Diagnosis Date  . Coronary artery disease   . Diabetes mellitus without complication (HCC)   . Glaucoma   . Hypercholesteremia   . Hypertension   . Myocardial infarction (HCC)    x3  . Sleep apnea    unable to tolerate CPAP    Past Surgical History:  Procedure Laterality Date  .  BOWEL RESECTION    . CARDIAC CATHETERIZATION     2 stents in heart  . CATARACT EXTRACTION W/PHACO Left 07/01/2013   Procedure: CATARACT EXTRACTION PHACO AND INTRAOCULAR LENS PLACEMENT (IOC);  Surgeon: Loraine LericheMark T. Nile RiggsShapiro, MD;  Location: [facility];  Service: Ophthalmology;  Laterality: Left;  CDE 6.84  . HAND SURGERY Left   . NASAL SEPTUM SURGERY    . SHOULDER ARTHROSCOPY Right     Family History  Problem Relation Age of Onset  . Stroke Mother   . Heart disease Father     Social History   Socioeconomic History  . Marital status: Married    Spouse name: Not on file  . Number of children: Not on file  . Years of education: Not on file  . Highest education level: Not on file  Occupational History  . Not on file  Social Needs  . Financial resource strain: Not on file  . Food insecurity    Worry: Not on file    Inability: Not on file  . Transportation needs    Medical: Not on file    Non-medical: Not on file  Tobacco Use  . Smoking status: Former Smoker    Packs/day: 2.00    Years: 40.00    Pack years: 80.00    Types: Cigarettes  . Smokeless tobacco: Never Used  Substance and Sexual Activity  . Alcohol use: Yes    Comment: occasional glass of wine  . Drug use: No  . Sexual activity: Not Currently  Lifestyle  . Physical activity    Days per week: Not on file    Minutes per session: Not on file  . Stress: Not on file  Relationships  . Social Musicianconnections    Talks on phone: Not on file    Gets together: Not on file    Attends religious service: Not on file    Active member of club or organization: Not on file    Attends meetings of clubs or organizations: Not on file    Relationship status: Not on file  . Intimate partner violence    Fear of current or ex partner: Not on file    Emotionally abused: Not on file    Physically abused: Not on file    Forced sexual activity: Not on file  Other Topics Concern  . Not on file  Social History Narrative  . Not on file     Objective:   Today's Vitals: BP (!) 155/77   Pulse 63   Temp 99.1 F (37.3 C) (Temporal)   Ht 5' 7.5" (1.715 m)   Wt 195 lb 12.8 oz (88.8 kg)   SpO2 98%   BMI 30.21 kg/m   Physical Exam Vitals signs reviewed.  Constitutional:      General: He is not in acute distress.    Appearance: Normal appearance. He is obese. He is not ill-appearing, toxic-appearing or diaphoretic.  HENT:     Head:  Normocephalic and atraumatic.  Eyes:     General: No scleral icterus.       Right eye: No discharge.        Left eye: No discharge.     Conjunctiva/sclera: Conjunctivae normal.  Neck:     Musculoskeletal: Normal range of motion.  Cardiovascular:     Rate and Rhythm: Normal rate and regular rhythm.     Heart sounds: Normal heart sounds. No murmur. No friction rub. No gallop.   Pulmonary:     Effort: Pulmonary effort is normal. No respiratory distress.     Breath sounds: Normal breath sounds. No stridor. No wheezing, rhonchi or rales.  Musculoskeletal: Normal range of motion.  Skin:    General: Skin is warm and dry.  Neurological:     Mental Status: He is alert and oriented to person, place, and time. Mental status is at baseline.  Psychiatric:        Mood and Affect: Mood normal.        Behavior: Behavior normal.        Thought Content: Thought content normal.        Judgment: Judgment normal.

## 2018-11-19 ENCOUNTER — Other Ambulatory Visit: Payer: Self-pay

## 2018-11-20 ENCOUNTER — Encounter: Payer: Self-pay | Admitting: Family Medicine

## 2018-11-20 ENCOUNTER — Ambulatory Visit (INDEPENDENT_AMBULATORY_CARE_PROVIDER_SITE_OTHER): Payer: Medicare PPO | Admitting: Family Medicine

## 2018-11-20 VITALS — BP 155/77 | HR 63 | Temp 99.1°F | Ht 67.5 in | Wt 195.8 lb

## 2018-11-20 DIAGNOSIS — Z23 Encounter for immunization: Secondary | ICD-10-CM

## 2018-11-20 DIAGNOSIS — E1165 Type 2 diabetes mellitus with hyperglycemia: Secondary | ICD-10-CM

## 2018-11-20 DIAGNOSIS — I10 Essential (primary) hypertension: Secondary | ICD-10-CM

## 2018-11-20 DIAGNOSIS — K219 Gastro-esophageal reflux disease without esophagitis: Secondary | ICD-10-CM | POA: Diagnosis not present

## 2018-11-20 MED ORDER — SHINGRIX 50 MCG/0.5ML IM SUSR: 0.5000 mL | Freq: Once | INTRAMUSCULAR | 0 refills | Status: AC

## 2018-11-20 NOTE — Patient Instructions (Signed)
Preventive Care 75 Years and Older, Male Preventive care refers to lifestyle choices and visits with your health care provider that can promote health and wellness. This includes:  A yearly physical exam. This is also called an annual well check.  Regular dental and eye exams.  Immunizations.  Screening for certain conditions.  Healthy lifestyle choices, such as diet and exercise. What can I expect for my preventive care visit? Physical exam Your health care provider will check:  Height and weight. These may be used to calculate body mass index (BMI), which is a measurement that tells if you are at a healthy weight.  Heart rate and blood pressure.  Your skin for abnormal spots. Counseling Your health care provider may ask you questions about:  Alcohol, tobacco, and drug use.  Emotional well-being.  Home and relationship well-being.  Sexual activity.  Eating habits.  History of falls.  Memory and ability to understand (cognition).  Work and work Statistician. What immunizations do I need?  Influenza (flu) vaccine  This is recommended every year. Tetanus, diphtheria, and pertussis (Tdap) vaccine  You may need a Td booster every 10 years. Varicella (chickenpox) vaccine  You may need this vaccine if you have not already been vaccinated. Zoster (shingles) vaccine  You may need this after age 50. Pneumococcal conjugate (PCV13) vaccine  One dose is recommended after age 24. Pneumococcal polysaccharide (PPSV23) vaccine  One dose is recommended after age 33. Measles, mumps, and rubella (MMR) vaccine  You may need at least one dose of MMR if you were born in 1957 or later. You may also need a second dose. Meningococcal conjugate (MenACWY) vaccine  You may need this if you have certain conditions. Hepatitis A vaccine  You may need this if you have certain conditions or if you travel or work in places where you may be exposed to hepatitis A. Hepatitis B vaccine   You may need this if you have certain conditions or if you travel or work in places where you may be exposed to hepatitis B. Haemophilus influenzae type b (Hib) vaccine  You may need this if you have certain conditions. You may receive vaccines as individual doses or as more than one vaccine together in one shot (combination vaccines). Talk with your health care provider about the risks and benefits of combination vaccines. What tests do I need? Blood tests  Lipid and cholesterol levels. These may be checked every 5 years, or more frequently depending on your overall health.  Hepatitis C test.  Hepatitis B test. Screening  Lung cancer screening. You may have this screening every year starting at age 74 if you have a 30-pack-year history of smoking and currently smoke or have quit within the past 15 years.  Colorectal cancer screening. All adults should have this screening starting at age 57 and continuing until age 54. Your health care provider may recommend screening at age 47 if you are at increased risk. You will have tests every 1-10 years, depending on your results and the type of screening test.  Prostate cancer screening. Recommendations will vary depending on your family history and other risks.  Diabetes screening. This is done by checking your blood sugar (glucose) after you have not eaten for a while (fasting). You may have this done every 1-3 years.  Abdominal aortic aneurysm (AAA) screening. You may need this if you are a current or former smoker.  Sexually transmitted disease (STD) testing. Follow these instructions at home: Eating and drinking  Eat  a diet that includes fresh fruits and vegetables, whole grains, lean protein, and low-fat dairy products. Limit your intake of foods with high amounts of sugar, saturated fats, and salt.  Take vitamin and mineral supplements as recommended by your health care provider.  Do not drink alcohol if your health care provider  tells you not to drink.  If you drink alcohol: ? Limit how much you have to 0-2 drinks a day. ? Be aware of how much alcohol is in your drink. In the [place] one drink equals one 12 oz bottle of beer (355 mL), one 5 oz glass of wine (148 mL), or one 1 oz glass of hard liquor (44 mL). Lifestyle  Take daily care of your teeth and gums.  Stay active. Exercise for at least 30 minutes on 5 or more days each week.  Do not use any products that contain nicotine or tobacco, such as cigarettes, e-cigarettes, and chewing tobacco. If you need help quitting, ask your health care provider.  If you are sexually active, practice safe sex. Use a condom or other form of protection to prevent STIs (sexually transmitted infections).  Talk with your health care provider about taking a low-dose aspirin or statin. What's next?  Visit your health care provider once a year for a well check visit.  Ask your health care provider how often you should have your eyes and teeth checked.  Stay up to date on all vaccines. This information is not intended to replace advice given to you by your health care provider. Make sure you discuss any questions you have with your health care provider. Document Released: 01/22/2015 Document Revised: 12/20/2017 Document Reviewed: 12/20/2017 Elsevier Patient Education  2020 Elsevier Inc.  

## 2019-04-05 ENCOUNTER — Other Ambulatory Visit: Payer: Self-pay

## 2019-04-05 ENCOUNTER — Inpatient Hospital Stay (HOSPITAL_COMMUNITY)
Admission: EM | Admit: 2019-04-05 | Discharge: 2019-04-08 | DRG: 246 | Disposition: A | Discharge status: Home / Self Care | Payer: Medicare PPO | Attending: Internal Medicine | Admitting: Internal Medicine

## 2019-04-05 ENCOUNTER — Inpatient Hospital Stay (HOSPITAL_COMMUNITY): Payer: Medicare PPO

## 2019-04-05 ENCOUNTER — Emergency Department (HOSPITAL_COMMUNITY): Payer: Medicare PPO

## 2019-04-05 ENCOUNTER — Encounter (HOSPITAL_COMMUNITY): Payer: Self-pay | Admitting: Emergency Medicine

## 2019-04-05 DIAGNOSIS — I252 Old myocardial infarction: Secondary | ICD-10-CM | POA: Diagnosis not present

## 2019-04-05 DIAGNOSIS — R079 Chest pain, unspecified: Secondary | ICD-10-CM | POA: Diagnosis not present

## 2019-04-05 DIAGNOSIS — Z794 Long term (current) use of insulin: Secondary | ICD-10-CM | POA: Diagnosis not present

## 2019-04-05 DIAGNOSIS — Z8249 Family history of ischemic heart disease and other diseases of the circulatory system: Secondary | ICD-10-CM

## 2019-04-05 DIAGNOSIS — Z20822 Contact with and (suspected) exposure to covid-19: Secondary | ICD-10-CM | POA: Diagnosis not present

## 2019-04-05 DIAGNOSIS — Z79899 Other long term (current) drug therapy: Secondary | ICD-10-CM | POA: Diagnosis not present

## 2019-04-05 DIAGNOSIS — I10 Essential (primary) hypertension: Secondary | ICD-10-CM | POA: Diagnosis not present

## 2019-04-05 DIAGNOSIS — Z79890 Hormone replacement therapy (postmenopausal): Secondary | ICD-10-CM

## 2019-04-05 DIAGNOSIS — N179 Acute kidney failure, unspecified: Secondary | ICD-10-CM | POA: Diagnosis present

## 2019-04-05 DIAGNOSIS — E7800 Pure hypercholesterolemia, unspecified: Secondary | ICD-10-CM | POA: Diagnosis present

## 2019-04-05 DIAGNOSIS — I214 Non-ST elevation (NSTEMI) myocardial infarction: Secondary | ICD-10-CM | POA: Diagnosis not present

## 2019-04-05 DIAGNOSIS — Z7982 Long term (current) use of aspirin: Secondary | ICD-10-CM

## 2019-04-05 DIAGNOSIS — E785 Hyperlipidemia, unspecified: Secondary | ICD-10-CM | POA: Diagnosis present

## 2019-04-05 DIAGNOSIS — Z955 Presence of coronary angioplasty implant and graft: Secondary | ICD-10-CM | POA: Diagnosis not present

## 2019-04-05 DIAGNOSIS — I25110 Atherosclerotic heart disease of native coronary artery with unstable angina pectoris: Secondary | ICD-10-CM | POA: Diagnosis not present

## 2019-04-05 DIAGNOSIS — I200 Unstable angina: Secondary | ICD-10-CM | POA: Diagnosis not present

## 2019-04-05 DIAGNOSIS — Z823 Family history of stroke: Secondary | ICD-10-CM

## 2019-04-05 DIAGNOSIS — E039 Hypothyroidism, unspecified: Secondary | ICD-10-CM | POA: Diagnosis present

## 2019-04-05 DIAGNOSIS — E118 Type 2 diabetes mellitus with unspecified complications: Secondary | ICD-10-CM | POA: Diagnosis not present

## 2019-04-05 DIAGNOSIS — K219 Gastro-esophageal reflux disease without esophagitis: Secondary | ICD-10-CM | POA: Diagnosis present

## 2019-04-05 DIAGNOSIS — E1165 Type 2 diabetes mellitus with hyperglycemia: Secondary | ICD-10-CM | POA: Diagnosis present

## 2019-04-05 LAB — CBC
HCT: 41.3 % (ref 39.0–52.0)
Hemoglobin: 12.5 g/dL — ABNORMAL LOW (ref 13.0–17.0)
MCH: 26.4 pg (ref 26.0–34.0)
MCHC: 30.3 g/dL (ref 30.0–36.0)
MCV: 87.3 fL (ref 80.0–100.0)
Platelets: 231 10*3/uL (ref 150–400)
RBC: 4.73 MIL/uL (ref 4.22–5.81)
RDW: 14.6 % (ref 11.5–15.5)
WBC: 5.8 10*3/uL (ref 4.0–10.5)
nRBC: 0 % (ref 0.0–0.2)

## 2019-04-05 LAB — TROPONIN I (HIGH SENSITIVITY)
Troponin I (High Sensitivity): 11 ng/L (ref <18)
Troponin I (High Sensitivity): 95 ng/L — ABNORMAL HIGH (ref <18)
Troponin I (High Sensitivity): 285 ng/L (ref <18)
Troponin I (High Sensitivity): 465 ng/L (ref <18)
Troponin I (High Sensitivity): 845 ng/L (ref <18)

## 2019-04-05 LAB — GLUCOSE, CAPILLARY
Glucose-Capillary: 102 mg/dL — ABNORMAL HIGH (ref 70–99)
Glucose-Capillary: 172 mg/dL — ABNORMAL HIGH (ref 70–99)

## 2019-04-05 LAB — HEMOGLOBIN A1C
Hgb A1c MFr Bld: 8.8 % — ABNORMAL HIGH (ref 4.8–5.6)
Mean Plasma Glucose: 205.86 mg/dL

## 2019-04-05 LAB — BRAIN NATRIURETIC PEPTIDE: B Natriuretic Peptide: 55 pg/mL (ref 0.0–100.0)

## 2019-04-05 LAB — CBG MONITORING, ED
Glucose-Capillary: 265 mg/dL — ABNORMAL HIGH (ref 70–99)
Glucose-Capillary: 162 mg/dL — ABNORMAL HIGH (ref 70–99)
Glucose-Capillary: 134 mg/dL — ABNORMAL HIGH (ref 70–99)

## 2019-04-05 LAB — BASIC METABOLIC PANEL
Anion gap: 9 (ref 5–15)
BUN: 30 mg/dL — ABNORMAL HIGH (ref 8–23)
CO2: 27 mmol/L (ref 22–32)
Calcium: 9.3 mg/dL (ref 8.9–10.3)
Chloride: 102 mmol/L (ref 98–111)
Creatinine, Ser: 1.36 mg/dL — ABNORMAL HIGH (ref 0.61–1.24)
GFR calc Af Amer: >60 mL/min (ref >60)
GFR calc non Af Amer: 52 mL/min — ABNORMAL LOW (ref >60)
Glucose, Bld: 280 mg/dL — ABNORMAL HIGH (ref 70–99)
Potassium: 4.1 mmol/L (ref 3.5–5.1)
Sodium: 138 mmol/L (ref 135–145)

## 2019-04-05 LAB — PROCALCITONIN: Procalcitonin: <0.1 ng/mL

## 2019-04-05 LAB — PROTIME-INR
INR: 1.1 (ref 0.8–1.2)
Prothrombin Time: 13.9 seconds (ref 11.4–15.2)

## 2019-04-05 LAB — TSH: TSH: 0.335 u[IU]/mL — ABNORMAL LOW (ref 0.350–4.500)

## 2019-04-05 LAB — APTT: aPTT: 32 seconds (ref 24–36)

## 2019-04-05 LAB — SODIUM, URINE, RANDOM: Sodium, Ur: 155 mmol/L

## 2019-04-05 LAB — HEPARIN LEVEL (UNFRACTIONATED): Heparin Unfractionated: 0.24 IU/mL — ABNORMAL LOW (ref 0.30–0.70)

## 2019-04-05 LAB — CREATININE, URINE, RANDOM: Creatinine, Urine: 87.63 mg/dL

## 2019-04-05 MED ORDER — HEPARIN SODIUM (PORCINE) 5000 UNIT/ML IJ SOLN
4000.0000 [IU] | Freq: Once | INTRAMUSCULAR | Status: AC
  Administered 2019-04-05: 12:00:00 4000 [IU] via INTRAVENOUS
  Filled 2019-04-05: qty 1

## 2019-04-05 MED ORDER — ASPIRIN 81 MG PO CHEW
324.0000 mg | CHEWABLE_TABLET | Freq: Once | ORAL | Status: AC
  Administered 2019-04-05: 12:00:00 324 mg via ORAL
  Filled 2019-04-05: qty 4

## 2019-04-05 MED ORDER — ISOSORBIDE MONONITRATE ER 60 MG PO TB24
120.0000 mg | ORAL_TABLET | Freq: Every day | ORAL | Status: DC
  Filled 2019-04-05 (×2): qty 2

## 2019-04-05 MED ORDER — LACTATED RINGERS IV SOLN: INTRAVENOUS | Status: AC

## 2019-04-05 MED ORDER — METOPROLOL TARTRATE 25 MG PO TABS
25.0000 mg | ORAL_TABLET | Freq: Every day | ORAL | Status: DC
  Administered 2019-04-05: 18:00:00 25 mg via ORAL
  Filled 2019-04-05: qty 1

## 2019-04-05 MED ORDER — ALBUTEROL SULFATE HFA 108 (90 BASE) MCG/ACT IN AERS
2.0000 | INHALATION_SPRAY | Freq: Four times a day (QID) | RESPIRATORY_TRACT | Status: DC | PRN
  Filled 2019-04-05: qty 6.7

## 2019-04-05 MED ORDER — HEPARIN (PORCINE) 25000 UT/250ML-% IV SOLN: 12.0000 [IU]/kg/h | INTRAVENOUS | Status: DC

## 2019-04-05 MED ORDER — SODIUM CHLORIDE 0.9% FLUSH
3.0000 mL | Freq: Once | INTRAVENOUS | Status: AC
  Administered 2019-04-05: 11:00:00 3 mL via INTRAVENOUS

## 2019-04-05 MED ORDER — NITROGLYCERIN IN D5W 200-5 MCG/ML-% IV SOLN
5.0000 ug/min | INTRAVENOUS | Status: DC
  Administered 2019-04-05: 12:00:00 10 ug/min via INTRAVENOUS
  Administered 2019-04-07: 09:00:00 16.667 ug/min via INTRAVENOUS
  Filled 2019-04-05 (×3): qty 250

## 2019-04-05 MED ORDER — NITROGLYCERIN 0.4 MG SL SUBL: 0.4000 mg | SUBLINGUAL_TABLET | SUBLINGUAL | Status: DC | PRN

## 2019-04-05 MED ORDER — ASPIRIN EC 81 MG PO TBEC
81.0000 mg | DELAYED_RELEASE_TABLET | Freq: Every day | ORAL | Status: DC
  Administered 2019-04-06: 81 mg via ORAL
  Filled 2019-04-05 (×3): qty 1

## 2019-04-05 MED ORDER — INSULIN ASPART 100 UNIT/ML ~~LOC~~ SOLN
0.0000 [IU] | SUBCUTANEOUS | Status: DC
  Administered 2019-04-05 – 2019-04-06 (×4): 3 [IU] via SUBCUTANEOUS
  Administered 2019-04-06 (×2): 2 [IU] via SUBCUTANEOUS
  Administered 2019-04-06: 07:00:00 3 [IU] via SUBCUTANEOUS
  Administered 2019-04-06 – 2019-04-07 (×3): 2 [IU] via SUBCUTANEOUS
  Administered 2019-04-07: 09:00:00 3 [IU] via SUBCUTANEOUS
  Administered 2019-04-08: 09:00:00 2 [IU] via SUBCUTANEOUS
  Filled 2019-04-05: qty 1

## 2019-04-05 MED ORDER — ATORVASTATIN CALCIUM 40 MG PO TABS
40.0000 mg | ORAL_TABLET | Freq: Every day | ORAL | Status: DC
  Administered 2019-04-05 – 2019-04-06 (×2): 40 mg via ORAL
  Filled 2019-04-05 (×2): qty 1

## 2019-04-05 MED ORDER — ACETAMINOPHEN 325 MG PO TABS: 650.0000 mg | ORAL_TABLET | Freq: Four times a day (QID) | ORAL | Status: DC | PRN

## 2019-04-05 MED ORDER — LEVOTHYROXINE SODIUM 88 MCG PO TABS
88.0000 ug | ORAL_TABLET | Freq: Every day | ORAL | Status: DC
  Administered 2019-04-06 – 2019-04-08 (×3): 88 ug via ORAL
  Filled 2019-04-05 (×5): qty 1

## 2019-04-05 MED ORDER — ONDANSETRON HCL 4 MG/2ML IJ SOLN: 4.0000 mg | Freq: Four times a day (QID) | INTRAMUSCULAR | Status: DC | PRN

## 2019-04-05 MED ORDER — INSULIN ASPART 100 UNIT/ML ~~LOC~~ SOLN: 0.0000 [IU] | Freq: Every day | SUBCUTANEOUS | Status: DC

## 2019-04-05 MED ORDER — ACETAMINOPHEN 650 MG RE SUPP: 650.0000 mg | Freq: Four times a day (QID) | RECTAL | Status: DC | PRN

## 2019-04-05 MED ORDER — PANTOPRAZOLE SODIUM 40 MG PO TBEC
40.0000 mg | DELAYED_RELEASE_TABLET | Freq: Every day | ORAL | Status: DC
  Administered 2019-04-05 – 2019-04-07 (×3): 40 mg via ORAL
  Filled 2019-04-05 (×3): qty 1

## 2019-04-05 MED ORDER — ONDANSETRON HCL 4 MG PO TABS: 4.0000 mg | ORAL_TABLET | Freq: Four times a day (QID) | ORAL | Status: DC | PRN

## 2019-04-05 MED ORDER — HEPARIN (PORCINE) 25000 UT/250ML-% IV SOLN
1250.0000 [IU]/h | INTRAVENOUS | Status: DC
  Administered 2019-04-05: 12:00:00 1000 [IU]/h via INTRAVENOUS
  Administered 2019-04-06: 1350 [IU]/h via INTRAVENOUS
  Administered 2019-04-07: 04:00:00 1250 [IU]/h via INTRAVENOUS
  Filled 2019-04-05 (×3): qty 250

## 2019-04-05 NOTE — Consult Note (Signed)
Cardiology Consultation:   Patient ID: Gregory Cole MRN: [id number]; DOB: [birth year]  Admit date: 04/05/2019 Date of Consult: 04/05/2019  Primary Care Provider: Gwenlyn Fudge, FNP Primary Cardiologist: No primary care provider on file.  Primary Electrophysiologist:  None     Patient Profile:   Gregory Cole is a 69 y.o. male with known CAD s/p PCI x 3 (2010), T2DM, HTN, and HLD who presentsw with chest pain found to have elevated hsTn concerning for NSTEMI.    History of Present Illness:   [facility] this morning after developing SSCP while walking his dog this morning. He took 2 SLN but achieved only incomplete relief of the pain and noted a pressure-like sensation that radiated to his back. Seeing how these symptoms were similar to his prior MI he presented for evaluation.   In the [facility], he was HTNsive to 1702/80s, with HR 78, and SpO2 98% on RA. He was having 3/10 chest pain on arrival. EKG without ischemic changes and initial hsTn 11. Cr 1.36 and BUN 30. Repeat hsTn with upward trend to 95-> 285 -> 465 -> 845. EKGs largely unchanged during this time and CP did not return.   He was subsequently transferred to [facility] for possible cardiac catheterization.   Past Medical History:  Diagnosis Date   Coronary artery disease    Diabetes mellitus without complication (HCC)    GERD (gastroesophageal reflux disease)    Glaucoma    Hypercholesteremia    Hypertension    Lesion of lung    [facility]   Myocardial infarction (HCC)    x3   Sleep apnea    unable to tolerate CPAP    Past Surgical History:  Procedure Laterality Date   BOWEL RESECTION     CARDIAC CATHETERIZATION     2 stents in heart   CATARACT EXTRACTION W/PHACO Left 07/01/2013   Procedure: CATARACT EXTRACTION PHACO AND INTRAOCULAR LENS PLACEMENT (IOC);  Surgeon: Loraine Leriche T. Nile Riggs, MD;  Location: [facility];  Service: Ophthalmology;  Laterality: Left;  CDE 6.84   HAND SURGERY  Left    NASAL SEPTUM SURGERY     SHOULDER ARTHROSCOPY Right      Home Medications:  Prior to Admission medications   Medication Sig Start Date End Date Taking? Authorizing Provider  albuterol (PROVENTIL HFA) 108 (90 BASE) MCG/ACT inhaler Inhale 2 puffs into the lungs every 6 (six) hours as needed for wheezing or shortness of breath.   Yes [provider]  aspirin EC 81 MG tablet Take 81 mg by mouth daily. Evening 1600   Yes [provider]  atorvastatin (LIPITOR) 40 MG tablet Take 40 mg by mouth daily. Evening 1600   Yes [provider]  furosemide (LASIX) 20 MG tablet Take 20 mg by mouth daily. Evening 1600   Yes [provider]  insulin aspart protamine - aspart (NOVOLOG MIX 70/30 FLEXPEN) (70-30) 100 UNIT/ML FlexPen Inject 36 Units into the skin daily. Before dinner.   Yes [provider]  isosorbide mononitrate (IMDUR) 120 MG 24 hr tablet Take 120 mg by mouth daily. Evening 1600   Yes [provider]  levothyroxine (SYNTHROID, LEVOTHROID) 88 MCG tablet Take 88 mcg by mouth daily before breakfast.   Yes [provider]  losartan (COZAAR) 100 MG tablet Take 100 mg by mouth daily. Evening 1600   Yes [provider]  metFORMIN (GLUCOPHAGE) 500 MG tablet Take 500 mg by mouth 2 (  two) times daily.   Yes [provider]  metoprolol tartrate (LOPRESSOR) 25 MG tablet Take 25 mg by mouth daily. Evening 1600   Yes [provider]  nitroGLYCERIN (NITROSTAT) 0.4 MG SL tablet Place under the tongue.   Yes [provider]  Omega-3 Fatty Acids (FISH OIL PO) Take 1,000 mg by mouth 2 (two) times daily.   Yes [provider]  omeprazole (PRILOSEC) 20 MG capsule Take 20 mg by mouth daily. Evening 1600   Yes [provider]  pioglitazone (ACTOS) 45 MG tablet Take 45 mg by mouth daily. Evening 1600   Yes [provider]    Inpatient Medications: Scheduled Meds:  aspirin EC  81 mg Oral  Daily   atorvastatin  40 mg Oral Daily   insulin aspart  0-15 Units Subcutaneous Q4H   insulin aspart  0-5 Units Subcutaneous QHS   isosorbide mononitrate  120 mg Oral Daily   [START ON 04/06/2019] levothyroxine  88 mcg Oral QAC breakfast   metoprolol tartrate  25 mg Oral Daily   pantoprazole  40 mg Oral Daily   Continuous Infusions:  heparin 1,200 Units/hr (04/05/19 2331)   lactated ringers 75 mL/hr at 04/05/19 1522   nitroGLYCERIN 16.6667 mcg/min (04/05/19 1601)   PRN Meds: acetaminophen **OR** acetaminophen, albuterol, ondansetron **OR** ondansetron (ZOFRAN) IV  Allergies:   No Known Allergies  Social History:   Social History   Socioeconomic History   Marital status: Married    Spouse name: Not on file   Number of children: Not on file   Years of education: Not on file   Highest education level: Not on file  Occupational History   Not on file  Tobacco Use   Smoking status: Former Smoker    Packs/day: 2.00    Years: 40.00    Pack years: 80.00    Types: Cigarettes   Smokeless tobacco: Never Used  Substance and Sexual Activity   Alcohol use: Yes    Comment: occasional glass of wine   Drug use: No   Sexual activity: Not Currently  Other Topics Concern   Not on file  Social History Narrative   Not on file   Social Determinants of Health   Financial Resource Strain:    Difficulty of Paying Living Expenses:   Food Insecurity:    Worried About Programme researcher, broadcasting/film/video in the Last Year:    Barista in the Last Year:   Transportation Needs:    Freight forwarder (Medical):    Lack of Transportation (Non-Medical):   Physical Activity:    Days of Exercise per Week:    Minutes of Exercise per Session:   Stress:    Feeling of Stress :   Social Connections:    Frequency of Communication with Friends and Family:    Frequency of Social Gatherings with Friends and Family:    Attends Religious Services:    Active Member of Clubs or Organizations:    Attends  Engineer, structural:    Marital Status:   Intimate Partner Violence:    Fear of Current or Ex-Partner:    Emotionally Abused:    Physically Abused:    Sexually Abused:     Family History:    Family History  Problem Relation Age of Onset   Stroke Mother    Heart disease Father      Review of Systems: [y] = yes, [ ]  = no    General: Weight gain [ ] ;  Weight loss [ ] ; Anorexia [ ] ; Fatigue [ ] ; Fever [ ] ; Chills [ ] ; Weakness [ ]   Cardiac: Chest pain/pressure ]; Resting SOB [ ] ; Exertional SOB [ ] ; Orthopnea [ ] ; Pedal Edema [ ] ; Palpitations [ ] ; Syncope [ ] ; Presyncope [ ] ; Paroxysmal nocturnal dyspnea[ ]   Pulmonary: Cough [ ] ; Wheezing[ ] ; Hemoptysis[ ] ; Sputum [ ] ; Snoring [ ]   GI: Vomiting[ ] ; Dysphagia[ ] ; Melena[ ] ; Hematochezia [ ] ; Heartburn[ ] ; Abdominal pain [ ] ; Constipation [ ] ; Diarrhea [ ] ; BRBPR [ ]   GU: Hematuria[ ] ; Dysuria [ ] ; Nocturia[ ]   Vascular: Pain in legs with walking [ ] ; Pain in feet with lying flat [ ] ; Non-healing sores [ ] ; Stroke [ ] ; TIA [ ] ; Slurred speech [ ] ;  Neuro: Headaches[ ] ; Vertigo[ ] ; Seizures[ ] ; Paresthesias[ ] ;Blurred vision [ ] ; Diplopia [ ] ; Vision changes [ ]   Ortho/Skin: Arthritis [ ] ; Joint pain [ ] ; Muscle pain [ ] ; Joint swelling [ ] ; Back Pain [ ] ; Rash [ ]   Psych: Depression[ ] ; Anxiety[ ]   Heme: Bleeding problems [ ] ; Clotting disorders [ ] ; Anemia [ ]   Endocrine: Diabetes [ ] ; Thyroid dysfunction[ ]   Physical Exam/Data:   Vitals:   04/05/19 1613 04/05/19 1707 04/05/19 1805 04/05/19 1959  BP: (!) 144/98 (!) 145/79  117/66  Pulse: 74 63 67 61  Resp: (!) 22   (!) 22  Temp: 98.1 F (36.7 C) 97.8 F (36.6 C)  97.8 F (36.6 C)  TempSrc: Oral Oral  Oral  SpO2: 98% 98%  100%  Weight:      Height:        Intake/Output Summary (Last 24 hours) at 04/05/2019 2353 Last data filed at 04/05/2019 2204 Gross per 24 hour  Intake 114.26 ml  Output 450 ml  Net -335.74 ml   Filed Weights   04/05/19 1107  Weight: 90.7  kg   Body mass index is 31.32 kg/m.  General:  Well nourished, well developed, in no acute distress HEENT: normal Neck: no JVD Vascular: No carotid bruits; FA pulses 2+ bilaterally without bruits  Cardiac:  normal S1, S2; RRR; no murmur Lungs:  clear to auscultation bilaterally, no wheezing, rhonchi or rales  Abd: soft, nontender, no hepatomegaly  Ext: no edema. 2+ DP pulses.  Musculoskeletal:  No deformities, BUE and BLE strength normal and equal Skin: warm and dry  Psych:  Normal affect   EKG:  The EKG was personally reviewed and demonstrates:  SR. Normal axis, normal intervals. No ischemic changes.   Relevant CV Studies: TTE pending.   Laboratory Data:  Chemistry Recent Labs  Lab 04/05/19 1110  NA 138  K 4.1  CL 102  CO2 27  GLUCOSE 280*  BUN 30*  CREATININE 1.36*  CALCIUM 9.3  GFRNONAA 52*  GFRAA >60  ANIONGAP 9    No results for input(s): PROT, ALBUMIN, AST, ALT, ALKPHOS, BILITOT in the last 168 hours. Hematology Recent Labs  Lab 04/05/19 1110  WBC 5.8  RBC 4.73  HGB 12.5*  HCT 41.3  MCV 87.3  MCH 26.4  MCHC 30.3  RDW 14.6  PLT 231   Cardiac EnzymesNo results for input(s): TROPONINI in the last 168 hours. No results for input(s): TROPIPOC in the last 168 hours.  BNP Recent Labs  Lab 04/05/19 1139  BNP 55.0    DDimer No results for input(s): DDIMER in the last 168 hours.  Radiology/Studies:  DG Chest Portable 1 View  Result Date: 04/05/2019  CLINICAL DATA:  Chest pain EXAM: PORTABLE CHEST 1 VIEW COMPARISON:  11/11/2008 FINDINGS: Bilateral mild interstitial and patchy alveolar airspace opacities. No pleural effusion or pneumothorax. Stable cardiomediastinal silhouette. No acute osseous abnormality. IMPRESSION: Bilateral interstitial and patchy alveolar airspace opacities concerning for mild pulmonary edema versus multilobar pneumonia including atypical viral pneumonia. Electronically Signed   By: Kathreen Devoid   On: 04/05/2019 12:01    Assessment  and Plan:   1. NSTEMI -- Heparin gtt, ACS dosing -- Hold Imdur while on nitro gtt. Titrate nitro to SBP < 160.  -- Needs med rec from [place] as reports on multiple antihypertensive agents (losartan e.g.) but unsure of dosing.  -- ASA 324 administered, continue ASA 81mg  daily -- Complete TTE ordered -- NPO @ [place] Sunday PM for possible coronary angiography -- Risk stratification with lipid panel, Hba1c -- Consult to cardiac rehab  For questions or updates, please contact [place] Please consult [email or web address] for contact info under   Signed, Milus Banister, MD  04/05/2019 11:53 PM

## 2019-04-05 NOTE — ED Notes (Signed)
Pt reports while walking his dog he experienced sharp CP  Took 2 NTG with some relief  Here for eval   Reports now pain is dull/constant 3/10  Monitor SR w ?T depression  IV, EKG, blood to lab

## 2019-04-05 NOTE — ED Notes (Signed)
Report to Blima Singer

## 2019-04-05 NOTE — ED Provider Notes (Signed)
Medical screening examination/treatment/procedure(s) were conducted as a shared visit with non-physician practitioner(s) and myself.  I personally evaluated the patient during the encounter.  Clinical Impression:   Final diagnoses:  Unstable angina [facility])    This patient is a 69 year old male, known heart disease, previous stenting in 2010 presenting with chest discomfort that started again this morning while he was walking his dog.  He has a heaviness and a pressure in his chest which is just right of the sternum, there was radiation into the neck earlier but that has improved and his pain is currently 3 out of 10.  He took 2 nitroglycerin tablets prehospital, this did not seem to do very much, he does not currently follow with a cardiologist though he has seen Dr. Tresa Endo many years ago.  On exam the patient has clear heart and lung sounds, no edema, he appears well, his EKG is unchanged and shows no ST elevations however the patient story is concerning for unstable angina or non-ST elevation MI.  He will likely need to be admitted to the hospital, anticoagulation, nitro drip, heparin drip and consultation with cardiology.  I discussed the care with Dr. Wyline Mood who will have the patient evaluated on arrival to [facility] - to admit to hospitalist.     Eber Hong, MD 04/06/19 [phone]

## 2019-04-05 NOTE — ED Notes (Signed)
Blood sugar 162

## 2019-04-05 NOTE — ED Notes (Signed)
PA at bedside for patient evaluation.

## 2019-04-05 NOTE — Progress Notes (Signed)
ANTICOAGULATION CONSULT NOTE - Initial Consult  Pharmacy Consult for heparin infusion Indication: chest pain/ACS  No Known Allergies  Patient Measurements: Height: 5\' 7"  (170.2 cm) Weight: 200 lb (90.7 kg) IBW/kg (Calculated) : 66.1 Heparin Dosing Weight: HEPARIN DW (KG): 85.1  Vital Signs: Temp: 97.8 F (36.6 C) (03/27 1107) Temp Source: Oral (03/27 1107) BP: 172/84 (03/27 1107) Pulse Rate: 64 (03/27 1115)  Labs: Recent Labs    04/05/19 1110  HGB 12.5*  HCT 41.3  PLT 231    CrCl cannot be calculated (Patient's most recent lab result is older than the maximum 21 days allowed.).   Medical History: Past Medical History:  Diagnosis Date  . Coronary artery disease   . Diabetes mellitus without complication (HCC)   . GERD (gastroesophageal reflux disease)   . Glaucoma   . Hypercholesteremia   . Hypertension   . Lesion of lung    [facility]  . Myocardial infarction (HCC)    x3  . Sleep apnea    unable to tolerate CPAP    Medications:  Patient wasn't on  anti-coagulation prior to admission.  Assessment: Pharmacy consulted to dose heparin fort his 69 yo male with increased  chest pressure and SOB while walking his dog.  EKG shows no ST elevation.  CBC and baseline coags are WNL.  Goal of Therapy:  Heparin level 0.3-0.7 units/ml Monitor platelets by anticoagulation protocol: Yes   Plan:  Give 4000 units bolus x 1 Start heparin infusion at 1000 units/hr Check anti-Xa level in 6-8 hours and daily while on heparin Continue to monitor H&H and platelets  66 04/05/2019,11:39 AM

## 2019-04-05 NOTE — ED Provider Notes (Signed)
[facility] Provider Note   Gregory Cole: [id number] Arrival date & time: 04/05/19  1053     History Chief Complaint  Patient presents with  . Chest Pain    Gregory Cole is a 69 y.o. male with a history as outlined below, most significant for CAD with acute MI x3 with stent intervention to his ramus in 2010, type II DM, hypertension, with reported history of stable angina, reporting last need of NTG treatment last fall, developed midsternal chest pressure along with shortness of breath approximately two thirds of a mile into his normal morning walk which he takes with his dog. symptoms started just prior to arrival.  He was unable to finish the walk due to severity of symptoms.  This pressure sensation radiated into his neck, but has significantly improved since taking nitroglycerin x2 prior to arrival.  He still has some pressure, rated 3 out of 10, shortness of breath is improved.  He denies nausea or vomiting, diaphoresis, palpitations.  He states his symptoms are similar to prior MI except less severe today.  HPI     Past Medical History:  Diagnosis Date  . Coronary artery disease   . Diabetes mellitus without complication ([place])   . GERD (gastroesophageal reflux disease)   . Glaucoma   . Hypercholesteremia   . Hypertension   . Lesion of lung    [facility]  . Myocardial infarction ([place])    x3  . Sleep apnea    unable to tolerate CPAP    Patient Active Problem List   Diagnosis Date Noted  . GERD (gastroesophageal reflux disease)   . Right testicular pain 04/04/2018  . Abdominal pain 03/20/2018  . Pneumoperitoneum 03/20/2018  . Uncontrolled diabetes mellitus ([place]) 01/21/2010  . Essential hypertension 01/21/2010  . CAD (coronary artery disease), native coronary artery 01/21/2010  . Tobacco use 01/21/2010    Past Surgical History:  Procedure Laterality Date  . BOWEL RESECTION    . CARDIAC CATHETERIZATION     2 stents in heart  .  CATARACT EXTRACTION W/PHACO Left 07/01/2013   Procedure: CATARACT EXTRACTION PHACO AND INTRAOCULAR LENS PLACEMENT (IOC);  Surgeon: Elta Guadeloupe T. Gershon Crane, MD;  Location: [facility];  Service: Ophthalmology;  Laterality: Left;  CDE 6.84  . HAND SURGERY Left   . NASAL SEPTUM SURGERY    . SHOULDER ARTHROSCOPY Right        Family History  Problem Relation Age of Onset  . Stroke Mother   . Heart disease Father     Social History   Tobacco Use  . Smoking status: Former Smoker    Packs/day: 2.00    Years: 40.00    Pack years: 80.00    Types: Cigarettes  . Smokeless tobacco: Never Used  Substance Use Topics  . Alcohol use: Yes    Comment: occasional glass of wine  . Drug use: No    Home Medications Prior to Admission medications   Medication Sig Start Date End Date Taking? Authorizing Provider  albuterol (PROVENTIL HFA) 108 (90 BASE) MCG/ACT inhaler Inhale 2 puffs into the lungs every 6 (six) hours as needed for wheezing or shortness of breath.    [provider]  aspirin EC 81 MG tablet Take 81 mg by mouth daily.    [provider]  atorvastatin (LIPITOR) 40 MG tablet Take 40 mg by mouth daily.    [provider]  furosemide (LASIX) 20 MG tablet Take 20 mg by mouth daily.  [provider]  insulin aspart protamine - aspart (NOVOLOG MIX 70/30 FLEXPEN) (70-30) 100 UNIT/ML FlexPen Inject 36 Units into the skin daily.    [provider]  isosorbide mononitrate (IMDUR) 120 MG 24 hr tablet Take 120 mg by mouth daily.    [provider]  levothyroxine (SYNTHROID, LEVOTHROID) 88 MCG tablet Take 88 mcg by mouth daily before breakfast.    [provider]  losartan (COZAAR) 100 MG tablet Take 100 mg by mouth daily.    [provider]  metFORMIN (GLUCOPHAGE) 500 MG tablet Take 500 mg by mouth 2 (two) times daily.    [provider]  metoprolol tartrate (LOPRESSOR) 25 MG tablet Take 25 mg by mouth daily.    [provider]  nitroGLYCERIN (NITROSTAT) 0.4 MG SL tablet Place under the tongue.    [provider]  Omega-3 Fatty Acids (FISH OIL PO) Take 1,000 mg by mouth 2 (two) times daily.    [provider]  omeprazole (PRILOSEC) 20 MG capsule Take 20 mg by mouth daily.    [provider]  pioglitazone (ACTOS) 45 MG tablet Take 45 mg by mouth daily.    [provider]    Allergies    Patient has no known allergies.  Review of Systems   Review of Systems  Constitutional: Negative for diaphoresis and fever.  HENT: Negative for congestion and sore throat.   Eyes: Negative.   Respiratory: Positive for shortness of breath.   Cardiovascular: Positive for chest pain. Negative for palpitations and leg swelling.  Gastrointestinal: Negative for abdominal pain, nausea and vomiting.  Genitourinary: Negative.   Musculoskeletal: Negative for arthralgias, joint swelling and neck pain.  Skin: Negative.  Negative for rash and wound.  Neurological: Negative for dizziness, weakness, light-headedness, numbness and headaches.  Psychiatric/Behavioral: Negative.     Physical Exam Updated Vital Signs BP 134/79   Pulse 71   Temp 97.8 F (36.6 C) (Oral)   Resp (!) 23   Ht 5\' 7"  (1.702 m)   Wt 90.7 kg   SpO2 95%   BMI 31.32 kg/m   Physical Exam Vitals and nursing note reviewed.  Constitutional:      Appearance: He is well-developed.  HENT:     Head: Normocephalic and atraumatic.  Eyes:     Conjunctiva/sclera: Conjunctivae normal.  Cardiovascular:     Rate and Rhythm: Normal rate and regular rhythm.     Heart sounds: Normal heart sounds.  Pulmonary:     Effort: Pulmonary effort is normal.     Breath sounds: Normal breath sounds. No wheezing, rhonchi or rales.  Abdominal:     General: Bowel sounds are normal.     Palpations: Abdomen is soft.     Tenderness: There is no abdominal tenderness.  Musculoskeletal:        General: Normal range of motion.      Cervical back: Normal range of motion.     Right lower leg: No tenderness. No edema.     Left lower leg: No tenderness. No edema.  Skin:    General: Skin is warm and dry.  Neurological:     Mental Status: He is alert.     ED Results / Procedures / Treatments   Labs (all labs ordered are listed, but only abnormal results are displayed) Labs Reviewed  BASIC METABOLIC PANEL - Abnormal; Notable for the following components:      Result Value   Glucose, Bld 280 (*)    BUN  30 (*)    Creatinine, Ser 1.36 (*)    GFR calc non Af Amer 52 (*)    All other components within normal limits  CBC - Abnormal; Notable for the following components:   Hemoglobin 12.5 (*)    All other components within normal limits  CBG MONITORING, ED - Abnormal; Notable for the following components:   Glucose-Capillary 265 (*)    All other components within normal limits  RESPIRATORY PANEL BY PCR  APTT  PROTIME-INR  BRAIN NATRIURETIC PEPTIDE  HEPARIN LEVEL (UNFRACTIONATED)  TROPONIN I (HIGH SENSITIVITY)    EKG EKG Interpretation  Date/Time:  Saturday April 05 2019 12:54:09 EDT Ventricular Rate:  68 PR Interval:    QRS Duration: 86 QT Interval:  401 QTC Calculation: 427 R Axis:   -31 Text Interpretation: Sinus rhythm Inferior infarct, old Anterior infarct, old since last tracing no significant change Confirmed by Eber Hong ([id number]) on 04/05/2019 12:56:40 PM   Radiology DG Chest Portable 1 View  Result Date: 04/05/2019 CLINICAL DATA:  Chest pain EXAM: PORTABLE CHEST 1 VIEW COMPARISON:  11/11/2008 FINDINGS: Bilateral mild interstitial and patchy alveolar airspace opacities. No pleural effusion or pneumothorax. Stable cardiomediastinal silhouette. No acute osseous abnormality. IMPRESSION: Bilateral interstitial and patchy alveolar airspace opacities concerning for mild pulmonary edema versus multilobar pneumonia including atypical viral pneumonia. Electronically Signed   By: Elige Ko   On:  04/05/2019 12:01    Procedures Procedures (including critical care time)  CRITICAL CARE Performed by: Burgess Amor Total critical care time: 35 minutes Critical care time was exclusive of separately billable procedures and treating other patients. Critical care was necessary to treat or prevent imminent or life-threatening deterioration. Critical care was time spent personally by me on the following activities: development of treatment plan with patient and/or surrogate as well as nursing, discussions with consultants, evaluation of patient's response to treatment, examination of patient, obtaining history from patient or surrogate, ordering and performing treatments and interventions, ordering and review of laboratory studies, ordering and review of radiographic studies, pulse oximetry and re-evaluation of patient's condition.   Medications Ordered in ED Medications  nitroGLYCERIN 50 mg in dextrose 5 % 250 mL (0.2 mg/mL) infusion (10 mcg/min Intravenous New Bag/Given 04/05/19 1150)  heparin ADULT infusion 100 units/mL (25000 units/237mL sodium chloride 0.45%) (1,000 Units/hr Intravenous New Bag/Given 04/05/19 1225)  sodium chloride flush (NS) 0.9 % injection 3 mL (3 mLs Intravenous Given 04/05/19 1116)  aspirin chewable tablet 324 mg (324 mg Oral Given 04/05/19 1147)  heparin injection 4,000 Units (4,000 Units Intravenous Given 04/05/19 1151)    ED Course  I have reviewed the triage vital signs and the nursing notes.  Pertinent labs & imaging results that were available during my care of the patient were reviewed by me and considered in my medical decision making (see chart for details).    MDM Rules/Calculators/A&P                      History and exam concerning for unstable angina.  Labs including delta troponin ordered.  Rapid turnaround Covid ordered in anticipation of probable need for intervention.  He was started on heparin bolus and heparin and ntg infusions.  Sx improved with tx,  VSS.  Pt seen by Dr Hyacinth Meeker who also spoke with Dr. Wyline Mood of cardiology re sx.  Recommended hospitalist admission at [facility] for cards to consult and be avail for cath if needed. Discussed with Dr. Sherryll Burger who accepts pt for admission.  Final Clinical Impression(s) / ED Diagnoses Final diagnoses:  Unstable angina [facility])    Rx / DC Orders ED Discharge Orders    None       Victoriano Lain 04/05/19 1259    Eber Hong, MD 04/06/19 [phone]

## 2019-04-05 NOTE — H&P (Addendum)
History and Physical    [name]:[id number] DOB: 08-23-1950 DOA: 04/05/2019  PCP: Gwenlyn Fudge, FNP   Patient coming from: Home  Chief Complaint: Chest pain  HPI: Gregory Cole is a 69 y.o. male with medical history significant for CAD with prior acute MI x3 with stent intervention to ramus in 2010, type 2 diabetes, hypertension, hypothyroidism, GERD, and dyslipidemia who presented to the ED after developing substernal chest pressure along with shortness of breath after walking a little more than half a mile with his dog this morning.  He states that he took 2 nitroglycerin at that time with significant relief of his pain, but it was not completely relieved.  He had some pressure sensation that radiated to his neck but not to his arms or back.  He denies any nausea, vomiting, or diaphoresis.  He states that his symptoms are similar to his prior MI in 2010 which made him very concerned.   ED Course: Stable vital signs noted and patient was noted to have some ongoing pressure on arrival to the ED rating this 3/10.  EKG with low voltage in sinus rhythm and no acute abnormalities and initial troponin was 11.  Chest x-ray with concerning findings of viral pneumonia or multi lobar infiltrate.  Based on his history and current symptoms, he has been started on nitroglycerin and heparin drip with concern for unstable angina.  EDP has discussed with cardiology at [facility] who agrees to see and transfer.  Review of Systems: As above and otherwise negative except for some noted cough that has persisted over the last few days.  Cough is nonproductive and no fevers or chills are noted.  Past Medical History:  Diagnosis Date  . Coronary artery disease   . Diabetes mellitus without complication (HCC)   . GERD (gastroesophageal reflux disease)   . Glaucoma   . Hypercholesteremia   . Hypertension   . Lesion of lung    [facility]  . Myocardial infarction (HCC)    x3   . Sleep apnea    unable to tolerate CPAP    Past Surgical History:  Procedure Laterality Date  . BOWEL RESECTION    . CARDIAC CATHETERIZATION     2 stents in heart  . CATARACT EXTRACTION W/PHACO Left 07/01/2013   Procedure: CATARACT EXTRACTION PHACO AND INTRAOCULAR LENS PLACEMENT (IOC);  Surgeon: Loraine Leriche T. Nile Riggs, MD;  Location: [facility];  Service: Ophthalmology;  Laterality: Left;  CDE 6.84  . HAND SURGERY Left   . NASAL SEPTUM SURGERY    . SHOULDER ARTHROSCOPY Right      reports that he has quit smoking. His smoking use included cigarettes. He has a 80.00 pack-year smoking history. He has never used smokeless tobacco. He reports current alcohol use. He reports that he does not use drugs.  No Known Allergies  Family History  Problem Relation Age of Onset  . Stroke Mother   . Heart disease Father     Prior to Admission medications   Medication Sig Start Date End Date Taking? Authorizing Provider  albuterol (PROVENTIL HFA) 108 (90 BASE) MCG/ACT inhaler Inhale 2 puffs into the lungs every 6 (six) hours as needed for wheezing or shortness of breath.    [provider]  aspirin EC 81 MG tablet Take 81 mg by mouth daily.    [provider]  atorvastatin (LIPITOR) 40 MG tablet Take 40 mg by mouth daily.    [provider]  furosemide (LASIX) 20 MG tablet Take 20 mg by mouth daily.    [provider]  insulin aspart protamine - aspart (NOVOLOG MIX 70/30 FLEXPEN) (70-30) 100 UNIT/ML FlexPen Inject 36 Units into the skin daily.    [provider]  isosorbide mononitrate (IMDUR) 120 MG 24 hr tablet Take 120 mg by mouth daily.    [provider]  levothyroxine (SYNTHROID, LEVOTHROID) 88 MCG tablet Take 88 mcg by mouth daily before breakfast.    [provider]  losartan (COZAAR) 100 MG tablet Take 100 mg by mouth daily.    [provider]  metFORMIN (GLUCOPHAGE) 500 MG tablet Take 500 mg by mouth 2 (two) times daily.     [provider]  metoprolol tartrate (LOPRESSOR) 25 MG tablet Take 25 mg by mouth daily.    [provider]  nitroGLYCERIN (NITROSTAT) 0.4 MG SL tablet Place under the tongue.    [provider]  Omega-3 Fatty Acids (FISH OIL PO) Take 1,000 mg by mouth 2 (two) times daily.    [provider]  omeprazole (PRILOSEC) 20 MG capsule Take 20 mg by mouth daily.    [provider]  pioglitazone (ACTOS) 45 MG tablet Take 45 mg by mouth daily.    [provider]    Physical Exam: Vitals:   04/05/19 1145 04/05/19 1200 04/05/19 1230 04/05/19 1300  BP:  134/79    Pulse: 68 69 71 65  Resp: 20 13 (!) 23 (!) 21  Temp:      TempSrc:      SpO2: 96% 96% 95% 95%  Weight:      Height:        Constitutional: NAD, calm, comfortable Vitals:   04/05/19 1145 04/05/19 1200 04/05/19 1230 04/05/19 1300  BP:  134/79    Pulse: 68 69 71 65  Resp: 20 13 (!) 23 (!) 21  Temp:      TempSrc:      SpO2: 96% 96% 95% 95%  Weight:      Height:       Eyes: lids and conjunctivae normal ENMT: Mucous membranes are moist.  Neck: normal, supple Respiratory: clear to auscultation bilaterally. Normal respiratory effort. No accessory muscle use.  Cardiovascular: Regular rate and rhythm, no murmurs. No extremity edema. Abdomen: no tenderness, no distention. Bowel sounds positive.  Musculoskeletal:  No joint deformity upper and lower extremities.   Skin: no rashes, lesions, ulcers.  Psychiatric: Normal judgment and insight. Alert and oriented x 3. Normal mood.   Labs on Admission: I have personally reviewed following labs and imaging studies  CBC: Recent Labs  Lab 04/05/19 1110  WBC 5.8  HGB 12.5*  HCT 41.3  MCV 87.3  PLT 194   Basic Metabolic Panel: Recent Labs  Lab 04/05/19 1110  NA 138  K 4.1  CL 102  CO2 27  GLUCOSE 280*  BUN 30*  CREATININE 1.36*  CALCIUM 9.3   GFR: Estimated Creatinine Clearance: 54.3 mL/min (A) (by C-G formula based on SCr  of 1.36 mg/dL (H)). Liver Function Tests: No results for input(s): AST, ALT, ALKPHOS, BILITOT, PROT, ALBUMIN in the last 168 hours. No results for input(s): LIPASE, AMYLASE in the last 168 hours. No results for input(s): AMMONIA in the last 168 hours. Coagulation Profile: Recent Labs  Lab 04/05/19 1139  INR 1.1   Cardiac Enzymes: No results for input(s): CKTOTAL, CKMB, CKMBINDEX, TROPONINI in the last 168 hours. BNP (last 3 results) No results for input(s): PROBNP in  the last 8760 hours. HbA1C: No results for input(s): HGBA1C in the last 72 hours. CBG: Recent Labs  Lab 04/05/19 1128  GLUCAP 265*   Lipid Profile: No results for input(s): CHOL, HDL, LDLCALC, TRIG, CHOLHDL, LDLDIRECT in the last 72 hours. Thyroid Function Tests: No results for input(s): TSH, T4TOTAL, FREET4, T3FREE, THYROIDAB in the last 72 hours. Anemia Panel: No results for input(s): VITAMINB12, FOLATE, FERRITIN, TIBC, IRON, RETICCTPCT in the last 72 hours. Urine analysis: No results found for: COLORURINE, APPEARANCEUR, LABSPEC, PHURINE, GLUCOSEU, HGBUR, BILIRUBINUR, KETONESUR, PROTEINUR, UROBILINOGEN, NITRITE, LEUKOCYTESUR  Radiological Exams on Admission: DG Chest Portable 1 View  Result Date: 04/05/2019 CLINICAL DATA:  Chest pain EXAM: PORTABLE CHEST 1 VIEW COMPARISON:  11/11/2008 FINDINGS: Bilateral mild interstitial and patchy alveolar airspace opacities. No pleural effusion or pneumothorax. Stable cardiomediastinal silhouette. No acute osseous abnormality. IMPRESSION: Bilateral interstitial and patchy alveolar airspace opacities concerning for mild pulmonary edema versus multilobar pneumonia including atypical viral pneumonia. Electronically Signed   By: Elige Ko   On: 04/05/2019 12:01    EKG: Independently reviewed. SR 81bpm with low voltage. No acute changes.  Assessment/Plan Active Problems:   Unstable angina (HCC)    Unstable angina in setting of CAD with prior MI -Maintain on heparin drip  as well as nitroglycerin drip -No current chest pain or pressure noted -Resume home beta-blocker -Transfer to [facility] for cardiology evaluation -Keep n.p.o. except meds and sips for now -Continue trending troponin as well as repeat EKG in a.m. -We will order 2D echocardiogram with prior 11/2008 with noted LVEF 50-55% and akinesis of the apical myocardium  AKI -Baseline creatinine approximately 0.9-1.1 with creatinine today 1.36 -We will maintain on some gentle IV fluid for now -Check urine electrolytes -Monitor I's and O's -Avoid nephrotoxic agents, hold losartan -Repeat a.m. labs  Patchy infiltrates in bilateral lungs -Patient does state he has had a mild cough and wife has been sick as well -Covid testing pending -Does not appear to have frank signs or symptoms of pneumonia, will check procalcitonin and follow CBC -No leukocytosis noted -Consider initiation of antibiotics as appropriate  Type 2 diabetes with hyperglycemia -Plan to check hemoglobin A1c -Hold oral meds -SSI and keep n.p.o. for now except medications until evaluation by cardiology  Hypertension-currently stable -Maintain on metoprolol and Imdur -Holding losartan and lasix due to AKI  Dyslipidemia -Continue atorvastatin and hold fish oil for now  Hypothyroidism -Continue Synthroid and check TSH   DVT prophylaxis: Heparin drip Code Status: Full Family Communication: Discussed with spouse at bedside Disposition Plan:Transfer to [facility] for Cardiology evaluation. Consults called:EDP discussed with Dr. Wyline Mood Admission status: Inpatient, Progressive   Perlie Scheuring D Sherryll Burger DO [facility]  If 7PM-7AM, please contact night-coverage [email or web address]  04/05/2019, 1:16 PM

## 2019-04-05 NOTE — Progress Notes (Signed)
Lab called for elevated troponin level now at 185 from previous level of 95. Truman Hayward MD updated via Loretha Stapler. No current c/o CP nor SOB.

## 2019-04-05 NOTE — ED Notes (Signed)
Report to [place], Charity fundraiser

## 2019-04-05 NOTE — ED Notes (Signed)
Called [facility] for transport 

## 2019-04-05 NOTE — Progress Notes (Signed)
ANTICOAGULATION CONSULT NOTE  Pharmacy Consult for heparin infusion Indication: chest pain/ACS  No Known Allergies  Patient Measurements: Height: 5\' 7"  (170.2 cm) Weight: 200 lb (90.7 kg) IBW/kg (Calculated) : 66.1 Heparin Dosing Weight: HEPARIN DW (KG): 85.1  Vital Signs: Temp: 97.8 F (36.6 C) (03/27 1959) Temp Source: Oral (03/27 1959) BP: 117/66 (03/27 1959) Pulse Rate: 61 (03/27 1959)  Labs: Recent Labs    04/05/19 1110 04/05/19 1110 04/05/19 1139 04/05/19 1511 04/05/19 1759 04/05/19 1934 04/05/19 2207  HGB 12.5*  --   --   --   --   --   --   HCT 41.3  --   --   --   --   --   --   PLT 231  --   --   --   --   --   --   APTT  --   --  32  --   --   --   --   LABPROT  --   --  13.9  --   --   --   --   INR  --   --  1.1  --   --   --   --   HEPARINUNFRC  --   --   --   --   --   --  0.24*  CREATININE 1.36*  --   --   --   --   --   --   TROPONINIHS 11   < >  --  95* 285* 465*  --    < > = values in this interval not displayed.    Estimated Creatinine Clearance: 54.3 mL/min (A) (by C-G formula based on SCr of 1.36 mg/dL (H)).   Medications:  Patient wasn't on  anti-coagulation prior to admission.  Assessment: Pharmacy consulted to dose heparin fort his 69 yo male with increased  chest pressure and SOB while walking his dog.  EKG shows no ST elevation.  CBC and baseline coags are WNL.  Initial heparin level 0.24  Goal of Therapy:  Heparin level 0.3-0.7 units/ml Monitor platelets by anticoagulation protocol: Yes   Plan:  Increase heparin to 1200 units / hr Follow up AM labs  Thank you 66, PharmD [phone]  04/05/2019,11:19 PM

## 2019-04-05 NOTE — ED Triage Notes (Signed)
Pt arrives from home with c/o chest pain that started while he was walking the dog. States discomfort is a heaviness/pressure in center of chest. Took 2 nitro that helped ease pressure. Current pressure 3/10 on NPS.

## 2019-04-05 NOTE — ED Notes (Signed)
Call to wife [phone]  She is informed of room number [facility]

## 2019-04-06 ENCOUNTER — Inpatient Hospital Stay (HOSPITAL_COMMUNITY): Payer: Medicare PPO

## 2019-04-06 DIAGNOSIS — I214 Non-ST elevation (NSTEMI) myocardial infarction: Secondary | ICD-10-CM

## 2019-04-06 DIAGNOSIS — R079 Chest pain, unspecified: Secondary | ICD-10-CM

## 2019-04-06 LAB — CBC
HCT: 36.9 % — ABNORMAL LOW (ref 39.0–52.0)
Hemoglobin: 11.5 g/dL — ABNORMAL LOW (ref 13.0–17.0)
MCH: 26.6 pg (ref 26.0–34.0)
MCHC: 31.2 g/dL (ref 30.0–36.0)
MCV: 85.2 fL (ref 80.0–100.0)
Platelets: 214 10*3/uL (ref 150–400)
RBC: 4.33 MIL/uL (ref 4.22–5.81)
RDW: 14.5 % (ref 11.5–15.5)
WBC: 5.9 10*3/uL (ref 4.0–10.5)
nRBC: 0 % (ref 0.0–0.2)

## 2019-04-06 LAB — COMPREHENSIVE METABOLIC PANEL
ALT: 26 U/L (ref 0–44)
AST: 47 U/L — ABNORMAL HIGH (ref 15–41)
Albumin: 3.3 g/dL — ABNORMAL LOW (ref 3.5–5.0)
Alkaline Phosphatase: 54 U/L (ref 38–126)
Anion gap: 8 (ref 5–15)
BUN: 23 mg/dL (ref 8–23)
CO2: 26 mmol/L (ref 22–32)
Calcium: 8.9 mg/dL (ref 8.9–10.3)
Chloride: 104 mmol/L (ref 98–111)
Creatinine, Ser: 1.19 mg/dL (ref 0.61–1.24)
GFR calc Af Amer: >60 mL/min (ref >60)
GFR calc non Af Amer: >60 mL/min (ref >60)
Glucose, Bld: 160 mg/dL — ABNORMAL HIGH (ref 70–99)
Potassium: 5 mmol/L (ref 3.5–5.1)
Sodium: 138 mmol/L (ref 135–145)
Total Bilirubin: 1.2 mg/dL (ref 0.3–1.2)
Total Protein: 6.5 g/dL (ref 6.5–8.1)

## 2019-04-06 LAB — RESPIRATORY PANEL BY PCR

## 2019-04-06 LAB — TROPONIN I (HIGH SENSITIVITY): Troponin I (High Sensitivity): 1079 ng/L (ref <18)

## 2019-04-06 LAB — SARS CORONAVIRUS 2 (TAT 6-24 HRS): SARS Coronavirus 2: NEGATIVE

## 2019-04-06 LAB — GLUCOSE, CAPILLARY
Glucose-Capillary: 147 mg/dL — ABNORMAL HIGH (ref 70–99)
Glucose-Capillary: 145 mg/dL — ABNORMAL HIGH (ref 70–99)
Glucose-Capillary: 154 mg/dL — ABNORMAL HIGH (ref 70–99)
Glucose-Capillary: 139 mg/dL — ABNORMAL HIGH (ref 70–99)
Glucose-Capillary: 139 mg/dL — ABNORMAL HIGH (ref 70–99)
Glucose-Capillary: 195 mg/dL — ABNORMAL HIGH (ref 70–99)
Glucose-Capillary: 160 mg/dL — ABNORMAL HIGH (ref 70–99)

## 2019-04-06 LAB — ECHOCARDIOGRAM COMPLETE
Height: 67 in
Weight: 3112 oz

## 2019-04-06 LAB — HEPARIN LEVEL (UNFRACTIONATED)
Heparin Unfractionated: 0.24 IU/mL — ABNORMAL LOW (ref 0.30–0.70)
Heparin Unfractionated: 0.81 IU/mL — ABNORMAL HIGH (ref 0.30–0.70)

## 2019-04-06 LAB — PROCALCITONIN: Procalcitonin: <0.1 ng/mL

## 2019-04-06 MED ORDER — ASPIRIN 81 MG PO CHEW
81.0000 mg | CHEWABLE_TABLET | ORAL | Status: AC
  Administered 2019-04-07: 06:00:00 81 mg via ORAL
  Filled 2019-04-06: qty 1

## 2019-04-06 MED ORDER — PERFLUTREN LIPID MICROSPHERE
1.0000 mL | INTRAVENOUS | Status: AC | PRN
  Administered 2019-04-06: 12:00:00 2.5 mL via INTRAVENOUS
  Filled 2019-04-06: qty 10

## 2019-04-06 MED ORDER — CHLORHEXIDINE GLUCONATE CLOTH 2 % EX PADS: 6.0000 | MEDICATED_PAD | Freq: Every day | CUTANEOUS | Status: DC

## 2019-04-06 MED ORDER — SODIUM CHLORIDE 0.9% FLUSH
3.0000 mL | Freq: Two times a day (BID) | INTRAVENOUS | Status: DC
  Administered 2019-04-06 – 2019-04-07 (×2): 3 mL via INTRAVENOUS

## 2019-04-06 MED ORDER — SODIUM CHLORIDE 0.9 % WEIGHT BASED INFUSION
3.0000 mL/kg/h | INTRAVENOUS | Status: DC
  Administered 2019-04-07: 3 mL/kg/h via INTRAVENOUS

## 2019-04-06 MED ORDER — SODIUM CHLORIDE 0.9% FLUSH: 3.0000 mL | INTRAVENOUS | Status: DC | PRN

## 2019-04-06 MED ORDER — SODIUM CHLORIDE 0.9 % WEIGHT BASED INFUSION: 1.0000 mL/kg/h | INTRAVENOUS | Status: DC

## 2019-04-06 MED ORDER — METOPROLOL TARTRATE 12.5 MG HALF TABLET
12.5000 mg | ORAL_TABLET | Freq: Two times a day (BID) | ORAL | Status: DC
  Administered 2019-04-06 – 2019-04-08 (×3): 12.5 mg via ORAL
  Filled 2019-04-06 (×4): qty 1

## 2019-04-06 MED ORDER — HEPARIN BOLUS VIA INFUSION
2000.0000 [IU] | Freq: Once | INTRAVENOUS | Status: AC
  Administered 2019-04-06: 09:00:00 2000 [IU] via INTRAVENOUS
  Filled 2019-04-06: qty 2000

## 2019-04-06 MED ORDER — SODIUM CHLORIDE 0.9% FLUSH: 10.0000 mL | INTRAVENOUS | Status: DC | PRN

## 2019-04-06 MED ORDER — SODIUM CHLORIDE 0.9 % IV SOLN: 250.0000 mL | INTRAVENOUS | Status: DC | PRN

## 2019-04-06 MED ORDER — LOSARTAN POTASSIUM 25 MG PO TABS
25.0000 mg | ORAL_TABLET | Freq: Every day | ORAL | Status: DC
  Administered 2019-04-06 – 2019-04-07 (×2): 25 mg via ORAL
  Filled 2019-04-06 (×2): qty 1

## 2019-04-06 NOTE — Progress Notes (Signed)
Pt Troponin 1079- paged Truman Hayward MD with this information- pt is still currently pain free and asymptomatic- will continue to monitor

## 2019-04-06 NOTE — Progress Notes (Signed)
ANTICOAGULATION CONSULT NOTE  Pharmacy Consult for heparin infusion Indication: chest pain/ACS  No Known Allergies  Patient Measurements: Height: 5\' 7"  (170.2 cm) Weight: 194 lb 8 oz (88.2 kg) IBW/kg (Calculated) : 66.1 Heparin Dosing Weight: HEPARIN DW (KG): 85.1  Vital Signs: Temp: 97.7 F (36.5 C) (03/28 0420) Temp Source: Oral (03/28 0420) BP: 156/88 (03/28 0420) Pulse Rate: 58 (03/28 0420)  Labs: Recent Labs    04/05/19 1110 04/05/19 1139 04/05/19 1511 04/05/19 1934 04/05/19 2207 04/05/19 2327 04/06/19 0612 04/06/19 0613  HGB 12.5*  --   --   --   --   --   --  11.5*  HCT 41.3  --   --   --   --   --   --  36.9*  PLT 231  --   --   --   --   --   --  214  APTT  --  32  --   --   --   --   --   --   LABPROT  --  13.9  --   --   --   --   --   --   INR  --  1.1  --   --   --   --   --   --   HEPARINUNFRC  --   --   --   --  0.24*  --  0.24*  --   CREATININE 1.36*  --   --   --   --   --   --   --   TROPONINIHS 11  --    < > 465* 845* 1,079*  --   --    < > = values in this interval not displayed.    Estimated Creatinine Clearance: 53.5 mL/min (A) (by C-G formula based on SCr of 1.36 mg/dL (H)).   Medications:  Patient wasn't on  anti-coagulation prior to admission.  Assessment: Pharmacy consulted to dose heparin fort his 69 yo male with increased  chest pressure and SOB while walking his dog.  EKG shows no ST elevation.  CBC and baseline coags are WNL.  Heparin level still low this AM  Goal of Therapy:  Heparin level 0.3-0.7 units/ml Monitor platelets by anticoagulation protocol: Yes   Plan:  Heparin 2000 units iv bolus x 1 Increase heparin to 1350 units / hr 6 hour heparin level  Thank you 66, PharmD [phone]  04/06/2019,6:56 AM

## 2019-04-06 NOTE — Progress Notes (Signed)
PROGRESS NOTE    Gregory Cole  HDQ:[id number] DOB: 21-Jun-1950 DOA: 04/05/2019 PCP: Loman Brooklyn, FNP    Brief Narrative:  69yo with hx CAD with prior MI x3 stent placements, DM, HTN, GERD, HLD presented with chest pains and sob. Troponins noted to be elevated and rising. Cardiology consulted and pt transferred to [facility] for further cardiac work up  [place]:   Active Problems:   Unstable angina (HCC)  Unstable angina in setting of CAD with prior MI -Maintain on heparin drip as well as nitroglycerin drip -No current chest pain or pressure noted -Pt was transferred to [facility] for cardiac [facility] -Seen by Cardiology, plan for heart cath 3/29 -Continued on ASA 81, atorvastatin 40mg , heparin gtt, ntg gtt, lopressor 12.5mg  bid, losartan 25mg  -Continue trending troponin as well as repeat EKG in a.m. -We will order 2D echocardiogram with prior 11/2008 with noted LVEF 50-55% and akinesis of the apical myocardium  AKI -Baseline creatinine approximately 0.9-1.1 with presenting creatinine 1.36 -given gentle  -Cr trending down -Avoid nephrotoxic agents if possible -Repeat bmet in AM  Patchy infiltrates in bilateral lungs -Patient does state he has had a mild cough and wife has been sick as well -Covid testing neg -Does not appear to have frank signs or symptoms of pneumonia, procal neg -No leukocytosis noted -Holding abx at this time  Type 2 diabetes with hyperglycemia -Plan to check hemoglobin A1c -Hold oral meds -cont SSI for now  Hypertension-currently stable -Maintain on metoprolol and Imdur -Holding losartan and lasix due to AKI  Dyslipidemia -Continue atorvastatin and hold fish oil for now -LFT's stable  Hypothyroidism -Continue Synthroid -TSH 0.335  DVT prophylaxis: Heparin gtt Code Status: Full Family Communication: Pt in room, family at bedside Disposition Plan: From home, pending heart cath, plan d/c home when cleared by  Cardiology  Consultants:   Cardiology  Procedures:     Antimicrobials: Anti-infectives (From admission, onward)   None       Subjective: Denies chest pain, but reports headache while on nitroglycerin gtt  Objective: Vitals:   04/05/19 1805 04/05/19 1959 04/06/19 0420 04/06/19 1411  BP:  117/66 (!) 156/88 123/69  Pulse: 67 61 (!) 58 67  Resp:  (!) 22 20   Temp:  97.8 F (36.6 C) 97.7 F (36.5 C) 98.1 F (36.7 C)  TempSrc:  Oral Oral Oral  SpO2:  100% 99% 98%  Weight:   88.2 kg   Height:        Intake/Output Summary (Last 24 hours) at 04/06/2019 1705 Last data filed at 04/06/2019 1300 Gross per 24 hour  Intake 718.23 ml  Output 1000 ml  Net -281.77 ml   Filed Weights   04/05/19 1107 04/06/19 0420  Weight: 90.7 kg 88.2 kg    Examination:  General exam: Appears calm and comfortable  Respiratory system: Clear to auscultation. Respiratory effort normal. Cardiovascular system: S1 & S2 heard, Regular Gastrointestinal system: Abdomen is nondistended, soft and nontender. No organomegaly or masses felt. Normal bowel sounds heard. Central nervous system: Alert and oriented. No focal neurological deficits. Extremities: Symmetric 5 x 5 power. Skin: No rashes, lesions Psychiatry: Judgement and insight appear normal. Mood & affect appropriate.   Data Reviewed: I have personally reviewed following labs and imaging studies  CBC: Recent Labs  Lab 04/05/19 1110 04/06/19 0613  WBC 5.8 5.9  HGB 12.5* 11.5*  HCT 41.3 36.9*  MCV 87.3 85.2  PLT 231 119   Basic Metabolic Panel: Recent Labs  Lab  04/05/19 1110 04/06/19 0612  NA 138 138  K 4.1 5.0  CL 102 104  CO2 27 26  GLUCOSE 280* 160*  BUN 30* 23  CREATININE 1.36* 1.19  CALCIUM 9.3 8.9   GFR: Estimated Creatinine Clearance: 61.2 mL/min (by C-G formula based on SCr of 1.19 mg/dL). Liver Function Tests: Recent Labs  Lab 04/06/19 0612  AST 47*  ALT 26  ALKPHOS 54  BILITOT 1.2  PROT 6.5  ALBUMIN 3.3*    No results for input(s): LIPASE, AMYLASE in the last 168 hours. No results for input(s): AMMONIA in the last 168 hours. Coagulation Profile: Recent Labs  Lab 04/05/19 1139  INR 1.1   Cardiac Enzymes: No results for input(s): CKTOTAL, CKMB, CKMBINDEX, TROPONINI in the last 168 hours. BNP (last 3 results) No results for input(s): PROBNP in the last 8760 hours. HbA1C: Recent Labs    04/05/19 1110  HGBA1C 8.8*   CBG: Recent Labs  Lab 04/06/19 0434 04/06/19 0648 04/06/19 0746 04/06/19 1107 04/06/19 1626  GLUCAP 145* 154* 139* 139* 195*   Lipid Profile: No results for input(s): CHOL, HDL, LDLCALC, TRIG, CHOLHDL, LDLDIRECT in the last 72 hours. Thyroid Function Tests: Recent Labs    04/05/19 1110  TSH 0.335*   Anemia Panel: No results for input(s): VITAMINB12, FOLATE, FERRITIN, TIBC, IRON, RETICCTPCT in the last 72 hours. Sepsis Labs: Recent Labs  Lab 04/05/19 1139 04/06/19 0612  PROCALCITON <0.10 <0.10    Recent Results (from the past 240 hour(s))  Respiratory Panel by PCR     Status: None   Collection Time: 04/05/19 11:27 AM   Specimen: Nasopharyngeal Swab; Respiratory  Result Value Ref Range Status   Adenovirus NOT DETECTED NOT DETECTED Final   Coronavirus 229E NOT DETECTED NOT DETECTED Final    Comment: (NOTE) The Coronavirus on the Respiratory Panel, DOES NOT test for the novel  Coronavirus (2019 nCoV)    Coronavirus HKU1 NOT DETECTED NOT DETECTED Final   Coronavirus NL63 NOT DETECTED NOT DETECTED Final   Coronavirus OC43 NOT DETECTED NOT DETECTED Final   Metapneumovirus NOT DETECTED NOT DETECTED Final   Rhinovirus / Enterovirus NOT DETECTED NOT DETECTED Final   Influenza A NOT DETECTED NOT DETECTED Final   Influenza B NOT DETECTED NOT DETECTED Final   Parainfluenza Virus 1 NOT DETECTED NOT DETECTED Final   Parainfluenza Virus 2 NOT DETECTED NOT DETECTED Final   Parainfluenza Virus 3 NOT DETECTED NOT DETECTED Final   Parainfluenza Virus 4 NOT  DETECTED NOT DETECTED Final   Respiratory Syncytial Virus NOT DETECTED NOT DETECTED Final   Bordetella pertussis NOT DETECTED NOT DETECTED Final   Chlamydophila pneumoniae NOT DETECTED NOT DETECTED Final   Mycoplasma pneumoniae NOT DETECTED NOT DETECTED Final    Comment: Performed at [facility], [address] [place], [place] [address]  SARS CORONAVIRUS 2 (TAT 6-24 HRS) Nasopharyngeal Nasopharyngeal Swab     Status: None   Collection Time: 04/05/19 11:27 AM   Specimen: Nasopharyngeal Swab  Result Value Ref Range Status   SARS Coronavirus 2 NEGATIVE NEGATIVE Final    Comment: (NOTE) SARS-CoV-2 target nucleic acids are NOT DETECTED. The SARS-CoV-2 RNA is generally detectable in upper and lower respiratory specimens during the acute phase of infection. Negative results do not preclude SARS-CoV-2 infection, do not rule out co-infections with other pathogens, and should not be used as the sole basis for treatment or other patient management decisions. Negative results must be combined with clinical observations, patient history, and epidemiological information. The  expected result is Negative. Fact Sheet for Patients: HairSlick.nohttps://www.fda.gov/media/138098/download Fact Sheet for Healthcare Providers: quierodirigir.comhttps://www.fda.gov/media/138095/download This test is not yet approved or cleared by the [place] FDA and  has been authorized for detection and/or diagnosis of SARS-CoV-2 by FDA under an Emergency Use Authorization (EUA). This EUA will remain  in effect (meaning this test can be used) for the duration of the COVID-19 declaration under Section 56 4(b)(1) of the Act, 21 U.S.C. section 360bbb-3(b)(1), unless the authorization is terminated or revoked sooner. Performed at [facility], [address] [place], [place] [address]      Radiology Studies: DG Chest Portable 1 View  Result Date: 04/05/2019 CLINICAL DATA:  Chest pain EXAM: PORTABLE CHEST 1 VIEW COMPARISON:   11/11/2008 FINDINGS: Bilateral mild interstitial and patchy alveolar airspace opacities. No pleural effusion or pneumothorax. Stable cardiomediastinal silhouette. No acute osseous abnormality. IMPRESSION: Bilateral interstitial and patchy alveolar airspace opacities concerning for mild pulmonary edema versus multilobar pneumonia including atypical viral pneumonia. Electronically Signed   By: Elige KoHetal  Patel   On: 04/05/2019 12:01   ECHOCARDIOGRAM COMPLETE  Result Date: 04/06/2019    ECHOCARDIOGRAM REPORT   Patient Name:   Gregory Cole Date of Exam: 04/06/2019 Medical Rec #:  [id number]       Height:       67.0 in Accession #:    [id number]      Weight:       194.5 lb Date of Birth:  12-27-1950      BSA:          1.998 m Patient Age:    68 years        BP:           156/88 mmHg Patient Gender: M               HR:           58 bpm. Exam Location:  Inpatient Procedure: 2D Echo and Intracardiac Opacification Agent Indications:    Chest Pain 786.50 / R07.9  History:        Patient has no prior history of Echocardiogram examinations.                 CAD, Previous Myocardial Infarction and Acute MI,                 Signs/Symptoms:Chest Pain; Risk Factors:Hypertension, Diabetes                 and Dyslipidemia. GERD.  Sonographer:    Leta Junglingiffany Cooper RDCS Referring Phys: [id number] Lamont DowdyRATIK D [facility] IMPRESSIONS  1. 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 Echo contrast used, there is no thrombus present within the apical aneurysm. Marland Kitchen. Left ventricular ejection fraction, by estimation, is 50%. The left ventricle has low normal function. The left ventricle demonstrates regional wall motion abnormalities (see scoring diagram/findings for description). There is mild left ventricular hypertrophy. Left ventricular diastolic parameters are consistent with Grade I diastolic dysfunction (impaired relaxation). Elevated left atrial pressure.  2. Right ventricular systolic function is normal. The right ventricular size is normal.  3. Left  atrial size was mildly dilated.  4. The mitral valve is normal in structure. No evidence of mitral valve regurgitation. No evidence of mitral stenosis.  5. The aortic valve is tricuspid. Aortic valve regurgitation is not visualized. No aortic stenosis is present. 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 Echo contrast used, there is no thrombus present within the apical aneurysm.  Left ventricular ejection fraction, by estimation, is 50%. The left ventricle has low normal function. The left ventricle demonstrates regional wall motion abnormalities. Definity contrast agent was given IV to delineate the left ventricular endocardial borders. The left ventricular internal cavity size was normal in size. There is mild left ventricular hypertrophy. Left ventricular diastolic parameters are consistent with Grade I diastolic dysfunction (impaired relaxation). Elevated left atrial pressure. Right Ventricle: The right ventricular size is normal. No increase in right ventricular wall thickness. Right ventricular systolic function is normal. Left Atrium: Left atrial size was mildly dilated. Right Atrium: Right atrial size was normal in size. Pericardium: There is no evidence of pericardial effusion. Mitral Valve: The mitral valve is normal in structure. No evidence of mitral valve regurgitation. No evidence of mitral valve stenosis. Tricuspid Valve: The tricuspid valve is normal in structure. Tricuspid valve regurgitation is not demonstrated. No evidence of tricuspid stenosis. Aortic Valve: The aortic valve is tricuspid. Aortic valve regurgitation is not visualized. No aortic stenosis is present. Pulmonic Valve: The pulmonic valve was not well visualized. Pulmonic valve regurgitation is not visualized. No evidence of pulmonic stenosis. Aorta: The aortic root is normal in size and structure. Pulmonary Artery: Indeterminant PASP, inadequate TR jet. IAS/Shunts: The interatrial septum was not well visualized.   LEFT VENTRICLE PLAX 2D LVIDd:         5.20 cm      Diastology LVIDs:         3.70 cm      LV e' lateral:   8.81 cm/s LV PW:         1.10 cm      LV E/e' lateral: 8.3 LV IVS:        1.20 cm      LV e' medial:    3.70 cm/s LVOT diam:     2.00 cm      LV E/e' medial:  19.8 LV SV:         71 LV SV Index:   35 LVOT Area:     3.14 cm  LV Volumes (MOD) LV vol d, MOD A2C: 128.0 ml LV vol d, MOD A4C: 178.0 ml LV vol s, MOD A4C: 83.6 ml LV SV MOD A4C:     178.0 ml RIGHT VENTRICLE RV S prime:     12.00 cm/s TAPSE (M-mode): 2.0 cm LEFT ATRIUM             Index       RIGHT ATRIUM           Index LA diam:        3.70 cm 1.85 cm/m  RA Area:     17.10 cm LA Vol (A2C):   68.0 ml 34.03 ml/m RA Volume:   47.70 ml  23.87 ml/m LA Vol (A4C):   55.6 ml 27.82 ml/m LA Biplane Vol: 66.3 ml 33.18 ml/m  AORTIC VALVE LVOT Vmax:   98.60 cm/s LVOT Vmean:  68.200 cm/s LVOT VTI:    0.225 m  AORTA Ao Root diam: 3.40 cm MITRAL VALVE MV Area (PHT): 4.15 cm    SHUNTS MV Decel Time: 183 msec    Systemic VTI:  0.22 m MV E velocity: 73.30 cm/s  Systemic Diam: 2.00 cm MV A velocity: 84.00 cm/s MV E/A ratio:  0.87 Dina Rich MD Electronically signed by Dina Rich MD Signature Date/Time: 04/06/2019/2:45:43 PM    Final     Scheduled Meds: . Melene Muller ON 04/07/2019] aspirin  81 mg Oral Pre-Cath  .  aspirin EC  81 mg Oral Daily  . atorvastatin  40 mg Oral Daily  . Chlorhexidine Gluconate Cloth  6 each Topical Daily  . insulin aspart  0-15 Units Subcutaneous Q4H  . insulin aspart  0-5 Units Subcutaneous QHS  . levothyroxine  88 mcg Oral QAC breakfast  . losartan  25 mg Oral Daily  . metoprolol tartrate  12.5 mg Oral BID  . pantoprazole  40 mg Oral Daily  . sodium chloride flush  3 mL Intravenous Q12H   Continuous Infusions: . sodium chloride    . [START ON 04/07/2019] sodium chloride     Followed by  . [START ON 04/07/2019] sodium chloride    . heparin 1,250 Units/hr (04/06/19 1532)  . nitroGLYCERIN 08.1448 mcg/min (04/05/19  1601)     LOS: 1 day   Rickey Barbara, MD [facility] Pager On Amion  If 7PM-7AM, please contact night-coverage 04/06/2019, 5:05 PM

## 2019-04-06 NOTE — Progress Notes (Signed)
ANTICOAGULATION CONSULT NOTE - Follow-Up  Pharmacy Consult for heparin infusion Indication: chest pain/ACS  No Known Allergies  Patient Measurements: Height: 5\' 7"  (170.2 cm) Weight: 194 lb 8 oz (88.2 kg) IBW/kg (Calculated) : 66.1 Heparin Dosing Weight: HEPARIN DW (KG): 85.1  Vital Signs: Temp: 98.1 F (36.7 C) (03/28 1411) Temp Source: Oral (03/28 1411) BP: 123/69 (03/28 1411) Pulse Rate: 67 (03/28 1411)  Labs: Recent Labs    04/05/19 1110 04/05/19 1139 04/05/19 1511 04/05/19 1934 04/05/19 2207 04/05/19 2327 04/06/19 0612 04/06/19 0613 04/06/19 1432  HGB 12.5*  --   --   --   --   --   --  11.5*  --   HCT 41.3  --   --   --   --   --   --  36.9*  --   PLT 231  --   --   --   --   --   --  214  --   APTT  --  32  --   --   --   --   --   --   --   LABPROT  --  13.9  --   --   --   --   --   --   --   INR  --  1.1  --   --   --   --   --   --   --   HEPARINUNFRC  --   --   --   --  0.24*  --  0.24*  --  0.81*  CREATININE 1.36*  --   --   --   --   --  1.19  --   --   TROPONINIHS 11  --    < > 465* 845* 1,079*  --   --   --    < > = values in this interval not displayed.    Estimated Creatinine Clearance: 61.2 mL/min (by C-G formula based on SCr of 1.19 mg/dL).   Medications:  Patient wasn't on  anti-coagulation prior to admission.  Assessment: Pharmacy consulted to dose heparin fort his 69 yo male with increased  chest pressure and SOB while walking his dog.  EKG shows no ST elevation.  CBC and baseline coags are WNL.  Heparin level now slightly over goal on 1350 units/hr.  No bleeding noted.  Plans for cardiac cath 3/29.  Goal of Therapy:  Heparin level 0.3-0.7 units/ml Monitor platelets by anticoagulation protocol: Yes   Plan:  Reduce heparin to 1250 units / hr Next heparin level with AM labs. Daily heparin level and CBC  Carrianne Hyun, Pharm.D., BCPS Clinical Pharmacist Clinical phone for 04/06/2019 from 7:30-3:00 is [phone].  **Pharmacist phone  directory can be found on [email or web address] listed under [facility].  04/06/2019 3:17 PM

## 2019-04-06 NOTE — Progress Notes (Signed)
  Echocardiogram 2D Echocardiogram with definity has been performed.  Leta Jungling M 04/06/2019, 11:50 AM

## 2019-04-06 NOTE — Progress Notes (Signed)
Progress Note  Patient Name: Gregory Cole Date of Encounter: 04/06/2019  Primary Cardiologist: New  Subjective   No recurrent chest pain, no SOB  Inpatient Medications    Scheduled Meds: . aspirin EC  81 mg Oral Daily  . atorvastatin  40 mg Oral Daily  . Chlorhexidine Gluconate Cloth  6 each Topical Daily  . insulin aspart  0-15 Units Subcutaneous Q4H  . insulin aspart  0-5 Units Subcutaneous QHS  . isosorbide mononitrate  120 mg Oral Daily  . levothyroxine  88 mcg Oral QAC breakfast  . metoprolol tartrate  25 mg Oral Daily  . pantoprazole  40 mg Oral Daily   Continuous Infusions: . heparin 1,350 Units/hr (04/06/19 0834)  . nitroGLYCERIN 16.6667 mcg/min (04/05/19 1601)   PRN Meds: acetaminophen **OR** acetaminophen, albuterol, ondansetron **OR** ondansetron (ZOFRAN) IV, sodium chloride flush   Vital Signs    Vitals:   04/05/19 1707 04/05/19 1805 04/05/19 1959 04/06/19 0420  BP: (!) 145/79  117/66 (!) 156/88  Pulse: 63 67 61 (!) 58  Resp:   (!) 22 20  Temp: 97.8 F (36.6 C)  97.8 F (36.6 C) 97.7 F (36.5 C)  TempSrc: Oral  Oral Oral  SpO2: 98%  100% 99%  Weight:    88.2 kg  Height:        Intake/Output Summary (Last 24 hours) at 04/06/2019 1044 Last data filed at 04/06/2019 0900 Gross per 24 hour  Intake 468.23 ml  Output 1000 ml  Net -531.77 ml   Last 3 Weights 04/06/2019 04/05/2019 11/20/2018  Weight (lbs) 194 lb 8 oz 200 lb 195 lb 12.8 oz  Weight (kg) 88.225 kg 90.719 kg 88.814 kg      Telemetry    SR - Personally Reviewed  ECG    n/a - Personally Reviewed  Physical Exam   GEN: No acute distress.   Neck: No JVD Cardiac: RRR, no murmurs, rubs, or gallops.  Respiratory: Clear to auscultation bilaterally. GI: Soft, nontender, non-distended  MS: No edema; No deformity. Neuro:  Nonfocal  Psych: Normal affect   Labs    High Sensitivity Troponin:   Recent Labs  Lab 04/05/19 1511 04/05/19 1759 04/05/19 1934 04/05/19 2207  04/05/19 2327  TROPONINIHS 95* 285* 465* 845* 1,079*      Chemistry Recent Labs  Lab 04/05/19 1110 04/06/19 0612  NA 138 138  K 4.1 5.0  CL 102 104  CO2 27 26  GLUCOSE 280* 160*  BUN 30* 23  CREATININE 1.36* 1.19  CALCIUM 9.3 8.9  PROT  --  6.5  ALBUMIN  --  3.3*  AST  --  47*  ALT  --  26  ALKPHOS  --  54  BILITOT  --  1.2  GFRNONAA 52* >60  GFRAA >60 >60  ANIONGAP 9 8     Hematology Recent Labs  Lab 04/05/19 1110 04/06/19 0613  WBC 5.8 5.9  RBC 4.73 4.33  HGB 12.5* 11.5*  HCT 41.3 36.9*  MCV 87.3 85.2  MCH 26.4 26.6  MCHC 30.3 31.2  RDW 14.6 14.5  PLT 231 214    BNP Recent Labs  Lab 04/05/19 1139  BNP 55.0     DDimer No results for input(s): DDIMER in the last 168 hours.   Radiology    DG Chest Portable 1 View  Result Date: 04/05/2019 CLINICAL DATA:  Chest pain EXAM: PORTABLE CHEST 1 VIEW COMPARISON:  11/11/2008 FINDINGS: Bilateral mild interstitial and patchy alveolar airspace opacities. No pleural effusion  or pneumothorax. Stable cardiomediastinal silhouette. No acute osseous abnormality. IMPRESSION: Bilateral interstitial and patchy alveolar airspace opacities concerning for mild pulmonary edema versus multilobar pneumonia including atypical viral pneumonia. Electronically Signed   By: Kathreen Devoid   On: 04/05/2019 12:01    Cardiac Studies     Patient Profile     CARSON BOGDEN is a 69 y.o. male with known CAD s/p PCI x 3 (2010), T2DM, HTN, and HLD who presentsw with chest pain found to have elevated hsTn concerning for NSTEMI.   Assessment & Plan    1. NSTEMI - history of CAD with prior PCI in 2010 - presented with chest pain, trops up to 1100. EKG without acute ischemic findings - echo pending  - medical therapy with ASA 81, atorva 40, hep gtt, nitro gtt120, lopressor 12.5mg  bid, losartan 25mg  daily.  - plan for cath Monday    I have reviewed the risks, indications, and alternatives to cardiac catheterization, possible  angioplasty, and stenting with the patient and his wife today. Risks include but are not limited to bleeding, infection, vascular injury, stroke, myocardial infection, arrhythmia, kidney injury, radiation-related injury in the case of prolonged fluoroscopy use, emergency cardiac surgery, and death. The patient understands the risks of serious complication is 1-2 in 4174 with diagnostic cardiac cath and 1-2% or less with angioplasty/stenting.    For questions or updates, please contact [place] Please consult [email or web address] for contact info under        Signed, Carlyle Dolly, MD  04/06/2019, 10:44 AM

## 2019-04-07 ENCOUNTER — Encounter: Payer: Self-pay | Admitting: Cardiology

## 2019-04-07 ENCOUNTER — Encounter (HOSPITAL_COMMUNITY)
Admission: EM | Disposition: A | Discharge status: Home / Self Care | Payer: Self-pay | Source: Home / Self Care | Attending: Internal Medicine

## 2019-04-07 DIAGNOSIS — E785 Hyperlipidemia, unspecified: Secondary | ICD-10-CM

## 2019-04-07 DIAGNOSIS — I200 Unstable angina: Secondary | ICD-10-CM

## 2019-04-07 DIAGNOSIS — I25110 Atherosclerotic heart disease of native coronary artery with unstable angina pectoris: Principal | ICD-10-CM

## 2019-04-07 HISTORY — PX: CORONARY STENT INTERVENTION: CATH118234

## 2019-04-07 HISTORY — PX: LEFT HEART CATH AND CORONARY ANGIOGRAPHY: CATH118249

## 2019-04-07 LAB — BASIC METABOLIC PANEL
Anion gap: 10 (ref 5–15)
BUN: 22 mg/dL (ref 8–23)
CO2: 26 mmol/L (ref 22–32)
Calcium: 9 mg/dL (ref 8.9–10.3)
Chloride: 104 mmol/L (ref 98–111)
Creatinine, Ser: 1.24 mg/dL (ref 0.61–1.24)
GFR calc Af Amer: >60 mL/min (ref >60)
GFR calc non Af Amer: 59 mL/min — ABNORMAL LOW (ref >60)
Glucose, Bld: 158 mg/dL — ABNORMAL HIGH (ref 70–99)
Potassium: 4 mmol/L (ref 3.5–5.1)
Sodium: 140 mmol/L (ref 135–145)

## 2019-04-07 LAB — GLUCOSE, CAPILLARY
Glucose-Capillary: 148 mg/dL — ABNORMAL HIGH (ref 70–99)
Glucose-Capillary: 155 mg/dL — ABNORMAL HIGH (ref 70–99)
Glucose-Capillary: 142 mg/dL — ABNORMAL HIGH (ref 70–99)
Glucose-Capillary: 114 mg/dL — ABNORMAL HIGH (ref 70–99)
Glucose-Capillary: 139 mg/dL — ABNORMAL HIGH (ref 70–99)

## 2019-04-07 LAB — LIPID PANEL
Cholesterol: 122 mg/dL (ref 0–200)
HDL: 30 mg/dL — ABNORMAL LOW (ref >40)
LDL Cholesterol: 65 mg/dL (ref 0–99)
Total CHOL/HDL Ratio: 4.1 RATIO
Triglycerides: 134 mg/dL (ref <150)
VLDL: 27 mg/dL (ref 0–40)

## 2019-04-07 LAB — HEPARIN LEVEL (UNFRACTIONATED): Heparin Unfractionated: 0.67 IU/mL (ref 0.30–0.70)

## 2019-04-07 LAB — CBC
HCT: 38.1 % — ABNORMAL LOW (ref 39.0–52.0)
Hemoglobin: 11.6 g/dL — ABNORMAL LOW (ref 13.0–17.0)
MCH: 26 pg (ref 26.0–34.0)
MCHC: 30.4 g/dL (ref 30.0–36.0)
MCV: 85.4 fL (ref 80.0–100.0)
Platelets: 242 10*3/uL (ref 150–400)
RBC: 4.46 MIL/uL (ref 4.22–5.81)
RDW: 14.4 % (ref 11.5–15.5)
WBC: 7.2 10*3/uL (ref 4.0–10.5)
nRBC: 0 % (ref 0.0–0.2)

## 2019-04-07 LAB — POCT ACTIVATED CLOTTING TIME
Activated Clotting Time: 274 seconds
Activated Clotting Time: 417 seconds

## 2019-04-07 SURGERY — LEFT HEART CATH AND CORONARY ANGIOGRAPHY
Anesthesia: LOCAL

## 2019-04-07 MED ORDER — ATORVASTATIN CALCIUM 80 MG PO TABS
80.0000 mg | ORAL_TABLET | Freq: Every day | ORAL | Status: DC
  Administered 2019-04-07: 80 mg via ORAL
  Filled 2019-04-07: qty 1

## 2019-04-07 MED ORDER — VERAPAMIL HCL 2.5 MG/ML IV SOLN
INTRAVENOUS | Status: DC | PRN
  Administered 2019-04-07: 10 mL via INTRA_ARTERIAL

## 2019-04-07 MED ORDER — LIDOCAINE HCL (PF) 1 % IJ SOLN
INTRAMUSCULAR | Status: DC | PRN
  Administered 2019-04-07: 2 mL via INTRADERMAL

## 2019-04-07 MED ORDER — VERAPAMIL HCL 2.5 MG/ML IV SOLN
INTRAVENOUS | Status: AC
  Filled 2019-04-07: qty 2

## 2019-04-07 MED ORDER — HYDRALAZINE HCL 20 MG/ML IJ SOLN: 10.0000 mg | INTRAMUSCULAR | Status: AC | PRN

## 2019-04-07 MED ORDER — SODIUM CHLORIDE 0.9% FLUSH: 3.0000 mL | INTRAVENOUS | Status: DC | PRN

## 2019-04-07 MED ORDER — LIDOCAINE HCL (PF) 1 % IJ SOLN
INTRAMUSCULAR | Status: AC
  Filled 2019-04-07: qty 30

## 2019-04-07 MED ORDER — SODIUM CHLORIDE 0.9 % IV SOLN
INTRAVENOUS | Status: DC
  Administered 2019-04-08: 01:00:00 500 mL via INTRAVENOUS

## 2019-04-07 MED ORDER — SODIUM CHLORIDE 0.9% FLUSH
3.0000 mL | Freq: Two times a day (BID) | INTRAVENOUS | Status: DC
  Administered 2019-04-08: 09:00:00 3 mL via INTRAVENOUS

## 2019-04-07 MED ORDER — HEPARIN SODIUM (PORCINE) 1000 UNIT/ML IJ SOLN
INTRAMUSCULAR | Status: DC | PRN
  Administered 2019-04-07: 6000 [IU] via INTRAVENOUS
  Administered 2019-04-07: 1000 [IU] via INTRAVENOUS
  Administered 2019-04-07: 4500 [IU] via INTRAVENOUS

## 2019-04-07 MED ORDER — HEPARIN (PORCINE) IN NACL 1000-0.9 UT/500ML-% IV SOLN
INTRAVENOUS | Status: AC
  Filled 2019-04-07: qty 1000

## 2019-04-07 MED ORDER — FENTANYL CITRATE (PF) 100 MCG/2ML IJ SOLN
INTRAMUSCULAR | Status: AC
  Filled 2019-04-07: qty 2

## 2019-04-07 MED ORDER — IOHEXOL 350 MG/ML SOLN
INTRAVENOUS | Status: DC | PRN
  Administered 2019-04-07: 125 mL

## 2019-04-07 MED ORDER — DIAZEPAM 5 MG PO TABS: 5.0000 mg | ORAL_TABLET | ORAL | Status: DC | PRN

## 2019-04-07 MED ORDER — TICAGRELOR 90 MG PO TABS
ORAL_TABLET | ORAL | Status: AC
  Filled 2019-04-07: qty 2

## 2019-04-07 MED ORDER — ONDANSETRON HCL 4 MG/2ML IJ SOLN: 4.0000 mg | Freq: Four times a day (QID) | INTRAMUSCULAR | Status: DC | PRN

## 2019-04-07 MED ORDER — SODIUM CHLORIDE 0.9 % IV SOLN: 250.0000 mL | INTRAVENOUS | Status: DC | PRN

## 2019-04-07 MED ORDER — LABETALOL HCL 5 MG/ML IV SOLN: 10.0000 mg | INTRAVENOUS | Status: AC | PRN

## 2019-04-07 MED ORDER — ASPIRIN 81 MG PO CHEW: 81.0000 mg | CHEWABLE_TABLET | Freq: Every day | ORAL | Status: DC

## 2019-04-07 MED ORDER — TICAGRELOR 90 MG PO TABS
90.0000 mg | ORAL_TABLET | Freq: Two times a day (BID) | ORAL | Status: DC
  Administered 2019-04-07 – 2019-04-08 (×2): 90 mg via ORAL
  Filled 2019-04-07 (×2): qty 1

## 2019-04-07 MED ORDER — TICAGRELOR 90 MG PO TABS
ORAL_TABLET | ORAL | Status: DC | PRN
  Administered 2019-04-07: 180 mg via ORAL

## 2019-04-07 MED ORDER — MIDAZOLAM HCL 2 MG/2ML IJ SOLN
INTRAMUSCULAR | Status: DC | PRN
  Administered 2019-04-07 (×2): 0.5 mg via INTRAVENOUS
  Administered 2019-04-07: 2 mg via INTRAVENOUS

## 2019-04-07 MED ORDER — ALBUTEROL SULFATE (2.5 MG/3ML) 0.083% IN NEBU
2.5000 mg | INHALATION_SOLUTION | Freq: Four times a day (QID) | RESPIRATORY_TRACT | Status: DC | PRN
  Filled 2019-04-07: qty 3

## 2019-04-07 MED ORDER — NITROGLYCERIN 1 MG/10 ML FOR IR/CATH LAB
INTRA_ARTERIAL | Status: DC | PRN
  Administered 2019-04-07: 200 ug via INTRACORONARY
  Administered 2019-04-07: 100 ug via INTRACORONARY
  Administered 2019-04-07: 200 ug via INTRACORONARY
  Administered 2019-04-07: 100 ug

## 2019-04-07 MED ORDER — NITROGLYCERIN 1 MG/10 ML FOR IR/CATH LAB
INTRA_ARTERIAL | Status: AC
  Filled 2019-04-07: qty 10

## 2019-04-07 MED ORDER — ACETAMINOPHEN 325 MG PO TABS: 650.0000 mg | ORAL_TABLET | ORAL | Status: DC | PRN

## 2019-04-07 MED ORDER — MIDAZOLAM HCL 2 MG/2ML IJ SOLN
INTRAMUSCULAR | Status: AC
  Filled 2019-04-07: qty 2

## 2019-04-07 MED ORDER — HEPARIN (PORCINE) IN NACL 1000-0.9 UT/500ML-% IV SOLN
INTRAVENOUS | Status: DC | PRN
  Administered 2019-04-07 (×2): 500 mL

## 2019-04-07 MED ORDER — FENTANYL CITRATE (PF) 100 MCG/2ML IJ SOLN
INTRAMUSCULAR | Status: DC | PRN
  Administered 2019-04-07 (×3): 25 ug via INTRAVENOUS

## 2019-04-07 MED ORDER — SODIUM CHLORIDE 0.9 % IV SOLN
INTRAVENOUS | Status: AC | PRN
  Administered 2019-04-07: 125 mL/h via INTRAVENOUS

## 2019-04-07 SURGICAL SUPPLY — 17 items
BALLN SAPPHIRE 2.5X15 (BALLOONS) ×2
BALLN SAPPHIRE ~~LOC~~ 3.5X15 (BALLOONS) ×2 IMPLANT
BALLOON SAPPHIRE 2.5X15 (BALLOONS) ×1 IMPLANT
CATH LAUNCHER 6FR JR4 (CATHETERS) ×2 IMPLANT
CATH OPTITORQUE TIG 4.0 5F (CATHETERS) ×2 IMPLANT
DEVICE RAD COMP TR BAND LRG (VASCULAR PRODUCTS) ×2 IMPLANT
GLIDESHEATH SLEND A-KIT 6F 22G (SHEATH) ×2 IMPLANT
GLIDESHEATH SLEND SS 6F .021 (SHEATH) ×2 IMPLANT
GUIDEWIRE INQWIRE 1.5J.035X260 (WIRE) ×1 IMPLANT
INQWIRE 1.5J .035X260CM (WIRE) ×2
KIT ENCORE 26 ADVANTAGE (KITS) ×2 IMPLANT
KIT HEART LEFT (KITS) ×2 IMPLANT
PACK CARDIAC CATHETERIZATION (CUSTOM PROCEDURE TRAY) ×2 IMPLANT
STENT RESOLUTE ONYX 3.5X22 (Permanent Stent) ×2 IMPLANT
TRANSDUCER W/STOPCOCK (MISCELLANEOUS) ×2 IMPLANT
TUBING CIL FLEX 10 FLL-RA (TUBING) ×2 IMPLANT
WIRE COUGAR XT STRL 190CM (WIRE) ×2 IMPLANT

## 2019-04-07 NOTE — Care Management (Signed)
04-07-19 1648 Case Manager received consult for Brilinta cost. Benefits check submitted for Brilinta. Case Manager will follow for cost. Graves-Bigelow, Lamar Laundry , RN,BSN Case Manager

## 2019-04-07 NOTE — Progress Notes (Signed)
PROGRESS NOTE    PEARLEY BARANEK  ONG:[id number] DOB: 1950-07-05 DOA: 04/05/2019 PCP: Gwenlyn Fudge, FNP      Brief Narrative:  Mr. Mcclure is a 69 y.o. M with CAD s/p remote PCI, HTN, OSA not on CPAP and DM who presented with accelerated chest pain.  In the ER found to have detectable high-sensitivity troponin.  Cardiology was consulted and patient was started on heparin.         Assessment & Plan:  ACS -Continue aspirin -Continue atorvastatin 80 -Continue metoprolol -Continue heparin drip -Echocardiogram yesterday showed EF 50% -Hold Imdur for now   Essential hypertension Blood pressure normal  -Continue losartan, metoprolol -Hold losartan  Hypothyroidism -Continue levothyroxine  GERD -Continue pantoprazole  Diabetes Glucose well controlled -Continue sliding scale corrections -Hold Metformin, 70/30 -Stop Actos given heart disease, potential for CHF       Disposition: The patient was admitted with ACS.  He will undergo catheterization today, likely discharge tomorrow pending cardiology approval.   I will discharge when his heart cath is complete, and cardiology have signed off.        MDM: The below labs and imaging reports were reviewed and summarized above.  Medication management as above.  This is a severe acute illness with threat to life bodily function.   DVT prophylaxis: Not applicable, on heparin Code Status: FULL Family Communication: Wife at the bedside    Consultants:   Cardiology  Procedures:   3/28 echocardiogram-EF 50%, no significant valvular disease  3/29 left heart cath  Antimicrobials:      Culture data:              Subjective: Patient had mild chest pain earlier today, this is resolved quickly.  No dyspnea, respiratory distress, leg swelling, orthopnea.  Objective: Vitals:   04/07/19 1424 04/07/19 1500 04/07/19 1731 04/07/19 1826  BP: (!) 171/83 (!) 150/78 (!) 167/71 (!) 172/66  Pulse: 67  60 (!) 57 66  Resp: (!) 8     Temp:  97.7 F (36.5 C)    TempSrc:  Oral    SpO2: (!) 0% 98% 98% 99%  Weight:      Height:        Intake/Output Summary (Last 24 hours) at 04/07/2019 2024 Last data filed at 04/07/2019 1907 Gross per 24 hour  Intake 2400.25 ml  Output 2450 ml  Net -49.75 ml   Filed Weights   04/05/19 1107 04/06/19 0420 04/07/19 0416  Weight: 90.7 kg 88.2 kg 87.5 kg    Examination: General appearance:  adult male, alert and in no acute distress.   HEENT: Anicteric, conjunctiva pink, lids and lashes normal. No nasal deformity, discharge, epistaxis.  Lips moist, edentulous, oropharynx moist, no oral lesions, hearing normal Skin: Warm and dry.  no jaundice.  No suspicious rashes or lesions. Cardiac: RRR, nl S1-S2, no murmurs appreciated.  Capillary refill is brisk.  JVP normal.  No LE edema.  Radial  pulses 2+ and symmetric. Respiratory: Normal respiratory rate and rhythm.  CTAB without rales or wheezes. Abdomen: Abdomen soft.  no TTP. No ascites, distension, hepatosplenomegaly.   MSK: No deformities or effusions. Neuro: Awake and alert.  EOMI, moves all extremities. Speech fluent.    Psych: Sensorium intact and responding to questions, attention normal. Affect normal.  Judgment and insight appear normal.    Data Reviewed: I have personally reviewed following labs and imaging studies:  CBC: Recent Labs  Lab 04/05/19 1110 04/06/19 0613 04/07/19 0439  WBC 5.8 5.9  7.2  HGB 12.5* 11.5* 11.6*  HCT 41.3 36.9* 38.1*  MCV 87.3 85.2 85.4  PLT 231 214 242   Basic Metabolic Panel: Recent Labs  Lab 04/05/19 1110 04/06/19 0612 04/07/19 0439  NA 138 138 140  K 4.1 5.0 4.0  CL 102 104 104  CO2 27 26 26   GLUCOSE 280* 160* 158*  BUN 30* 23 22  CREATININE 1.36* 1.19 1.24  CALCIUM 9.3 8.9 9.0   GFR: Estimated Creatinine Clearance: 58.6 mL/min (by C-G formula based on SCr of 1.24 mg/dL). Liver Function Tests: Recent Labs  Lab 04/06/19 0612  AST 47*  ALT 26   ALKPHOS 54  BILITOT 1.2  PROT 6.5  ALBUMIN 3.3*   No results for input(s): LIPASE, AMYLASE in the last 168 hours. No results for input(s): AMMONIA in the last 168 hours. Coagulation Profile: Recent Labs  Lab 04/05/19 1139  INR 1.1   Cardiac Enzymes: No results for input(s): CKTOTAL, CKMB, CKMBINDEX, TROPONINI in the last 168 hours. BNP (last 3 results) No results for input(s): PROBNP in the last 8760 hours. HbA1C: Recent Labs    04/05/19 1110  HGBA1C 8.8*   CBG: Recent Labs  Lab 04/06/19 2111 04/07/19 0602 04/07/19 0754 04/07/19 1145 04/07/19 1603  GLUCAP 160* 148* 155* 142* 114*   Lipid Profile: Recent Labs    04/07/19 0937  CHOL 122  HDL 30*  LDLCALC 65  TRIG 04/09/19  CHOLHDL 4.1   Thyroid Function Tests: Recent Labs    04/05/19 1110  TSH 0.335*   Anemia Panel: No results for input(s): VITAMINB12, FOLATE, FERRITIN, TIBC, IRON, RETICCTPCT in the last 72 hours. Urine analysis: No results found for: COLORURINE, APPEARANCEUR, LABSPEC, PHURINE, GLUCOSEU, HGBUR, BILIRUBINUR, KETONESUR, PROTEINUR, UROBILINOGEN, NITRITE, LEUKOCYTESUR Sepsis Labs: @LABRCNTIP (procalcitonin:4,lacticacidven:4)  ) Recent Results (from the past 240 hour(s))  Respiratory Panel by PCR     Status: None   Collection Time: 04/05/19 11:27 AM   Specimen: Nasopharyngeal Swab; Respiratory  Result Value Ref Range Status   Adenovirus NOT DETECTED NOT DETECTED Final   Coronavirus 229E NOT DETECTED NOT DETECTED Final    Comment: (NOTE) The Coronavirus on the Respiratory Panel, DOES NOT test for the novel  Coronavirus (2019 nCoV)    Coronavirus HKU1 NOT DETECTED NOT DETECTED Final   Coronavirus NL63 NOT DETECTED NOT DETECTED Final   Coronavirus OC43 NOT DETECTED NOT DETECTED Final   Metapneumovirus NOT DETECTED NOT DETECTED Final   Rhinovirus / Enterovirus NOT DETECTED NOT DETECTED Final   Influenza A NOT DETECTED NOT DETECTED Final   Influenza B NOT DETECTED NOT DETECTED Final    Parainfluenza Virus 1 NOT DETECTED NOT DETECTED Final   Parainfluenza Virus 2 NOT DETECTED NOT DETECTED Final   Parainfluenza Virus 3 NOT DETECTED NOT DETECTED Final   Parainfluenza Virus 4 NOT DETECTED NOT DETECTED Final   Respiratory Syncytial Virus NOT DETECTED NOT DETECTED Final   Bordetella pertussis NOT DETECTED NOT DETECTED Final   Chlamydophila pneumoniae NOT DETECTED NOT DETECTED Final   Mycoplasma pneumoniae NOT DETECTED NOT DETECTED Final    Comment: Performed at [facility], [address] [place], [address] [place]  SARS CORONAVIRUS 2 (TAT 6-24 HRS) Nasopharyngeal Nasopharyngeal Swab     Status: None   Collection Time: 04/05/19 11:27 AM   Specimen: Nasopharyngeal Swab  Result Value Ref Range Status   SARS Coronavirus 2 NEGATIVE NEGATIVE Final    Comment: (NOTE) SARS-CoV-2 target nucleic acids are NOT DETECTED. The SARS-CoV-2 RNA is generally detectable in upper  and lower respiratory specimens during the acute phase of infection. Negative results do not preclude SARS-CoV-2 infection, do not rule out co-infections with other pathogens, and should not be used as the sole basis for treatment or other patient management decisions. Negative results must be combined with clinical observations, patient history, and epidemiological information. The expected result is Negative. Fact Sheet for Patients: SugarRoll.be Fact Sheet for Healthcare Providers: https://www.woods-mathews.com/ This test is not yet approved or cleared by the [place] FDA and  has been authorized for detection and/or diagnosis of SARS-CoV-2 by FDA under an Emergency Use Authorization (EUA). This EUA will remain  in effect (meaning this test can be used) for the duration of the COVID-19 declaration under Section 56 4(b)(1) of the Act, 21 U.S.C. section 360bbb-3(b)(1), unless the authorization is terminated or revoked sooner. Performed at [place], [place] [address], [place], [place] [address]          Radiology Studies: CARDIAC CATHETERIZATION  Result Date: 04/07/2019  Prox RCA lesion is 35% stenosed.  RPDA-1 lesion is 70% stenosed.  RPDA-2 lesion is 50% stenosed.  Mid LAD to Dist LAD lesion is 100% stenosed.  Prox LAD lesion is 20% stenosed.  Mid LAD lesion is 20% stenosed.  Previously placed Ramus stent (unknown type) is widely patent.  2nd RPL lesion is 50% stenosed.  Mid RCA lesion is 80% stenosed.  Post intervention, there is a 0% residual stenosis.  A stent was successfully placed.  Multivessel CAD with previously noted old total occlusion of the mid LAD with mild proximal to mid narrowing of 20 to 30% and evidence for faint bridging collaterals from septal perforating; widely patent stent to the proximal ramus intermediate vessel; normal left circumflex coronary artery; large dominant RCA with mild to moderate diffuse irregularity with narrowing of 30% stenoses proximally, diffuse 80% eccentric stenosis in the midportion of the vessel between the RV marginal branch and acute margin, 70% focal ostial PDA stenosis followed by 50% mid stenosis, 50% stenosis in inferior LV branch. Low normal LV function with with EF estimated 50% with mid inferior hypocontractility and suggestion of possible distal apical hypocontractility.  LVEDP 9 mm. Successful PCI to the dominant RCA with ultimate insertion of a 3.5 x 22 mm Resolute Onyx stent postdilated to 3.6 mm with the stenosis being reduced to 0% and brisk TIMI-3 flow.  Following successful reperfusion there was improved collateralization to the distal LAD via the distal RCA branches.  There was a ery small caliber marginal branch that arose from the mid RCA in the region of the stented segment which had 95% stenosis prior to intervention and was unchanged with TIMI-3 flow following stenting. RECOMMENDATION: DAPT for minimum of a year.  Medical therapy for concomitant CAD.  Aggressive  lipid-lowering therapy with target LDL less than 70%.  Continue IV nitroglycerin today with plans to wean and DC by a.m.   ECHOCARDIOGRAM COMPLETE  Result Date: 04/06/2019    ECHOCARDIOGRAM REPORT   Patient Name:   OSINACHI NAVARRETTE Bebout Date of Exam: 04/06/2019 Medical Rec #:  [id number]       Height:       67.0 in Accession #:    [id number]      Weight:       194.5 lb Date of Birth:  03/18/1950      BSA:          1.998 m Patient Age:    43 years        BP:  156/88 mmHg Patient Gender: M               HR:           58 bpm. Exam Location:  Inpatient Procedure: 2D Echo and Intracardiac Opacification Agent Indications:    Chest Pain 786.50 / R07.9  History:        Patient has no prior history of Echocardiogram examinations.                 CAD, Previous Myocardial Infarction and Acute MI,                 Signs/Symptoms:Chest Pain; Risk Factors:Hypertension, Diabetes                 and Dyslipidemia. GERD.  Sonographer:    Leta Junglingiffany Cooper RDCS Referring Phys: [id number] Lamont DowdyRATIK D [facility] IMPRESSIONS  1. 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 Echo contrast used, there is no thrombus present within the apical aneurysm. Marland Kitchen. Left ventricular ejection fraction, by estimation, is 50%. The left ventricle has low normal function. The left ventricle demonstrates regional wall motion abnormalities (see scoring diagram/findings for description). There is mild left ventricular hypertrophy. Left ventricular diastolic parameters are consistent with Grade I diastolic dysfunction (impaired relaxation). Elevated left atrial pressure.  2. Right ventricular systolic function is normal. The right ventricular size is normal.  3. Left atrial size was mildly dilated.  4. The mitral valve is normal in structure. No evidence of mitral valve regurgitation. No evidence of mitral stenosis.  5. The aortic valve is tricuspid. Aortic valve regurgitation is not visualized. No aortic stenosis is present. FINDINGS  Left Ventricle: The LV apex is akinetic  and aneurysmal. Echo contrast used, there is no thrombus present within the apical aneurysm. Left ventricular ejection fraction, by estimation, is 50%. The left ventricle has low normal function. The left ventricle demonstrates regional wall motion abnormalities. Definity contrast agent was given IV to delineate the left ventricular endocardial borders. The left ventricular internal cavity size was normal in size. There is mild left ventricular hypertrophy. Left ventricular diastolic parameters are consistent with Grade I diastolic dysfunction (impaired relaxation). Elevated left atrial pressure. Right Ventricle: The right ventricular size is normal. No increase in right ventricular wall thickness. Right ventricular systolic function is normal. Left Atrium: Left atrial size was mildly dilated. Right Atrium: Right atrial size was normal in size. Pericardium: There is no evidence of pericardial effusion. Mitral Valve: The mitral valve is normal in structure. No evidence of mitral valve regurgitation. No evidence of mitral valve stenosis. Tricuspid Valve: The tricuspid valve is normal in structure. Tricuspid valve regurgitation is not demonstrated. No evidence of tricuspid stenosis. Aortic Valve: The aortic valve is tricuspid. Aortic valve regurgitation is not visualized. No aortic stenosis is present. Pulmonic Valve: The pulmonic valve was not well visualized. Pulmonic valve regurgitation is not visualized. No evidence of pulmonic stenosis. Aorta: The aortic root is normal in size and structure. Pulmonary Artery: Indeterminant PASP, inadequate TR jet. IAS/Shunts: The interatrial septum was not well visualized.  LEFT VENTRICLE PLAX 2D LVIDd:         5.20 cm      Diastology LVIDs:         3.70 cm      LV e' lateral:   8.81 cm/s LV PW:         1.10 cm      LV E/e' lateral: 8.3 LV IVS:        1.20 cm  LV e' medial:    3.70 cm/s LVOT diam:     2.00 cm      LV E/e' medial:  19.8 LV SV:         71 LV SV Index:   35 LVOT  Area:     3.14 cm  LV Volumes (MOD) LV vol d, MOD A2C: 128.0 ml LV vol d, MOD A4C: 178.0 ml LV vol s, MOD A4C: 83.6 ml LV SV MOD A4C:     178.0 ml RIGHT VENTRICLE RV S prime:     12.00 cm/s TAPSE (M-mode): 2.0 cm LEFT ATRIUM             Index       RIGHT ATRIUM           Index LA diam:        3.70 cm 1.85 cm/m  RA Area:     17.10 cm LA Vol (A2C):   68.0 ml 34.03 ml/m RA Volume:   47.70 ml  23.87 ml/m LA Vol (A4C):   55.6 ml 27.82 ml/m LA Biplane Vol: 66.3 ml 33.18 ml/m  AORTIC VALVE LVOT Vmax:   98.60 cm/s LVOT Vmean:  68.200 cm/s LVOT VTI:    0.225 m  AORTA Ao Root diam: 3.40 cm MITRAL VALVE MV Area (PHT): 4.15 cm    SHUNTS MV Decel Time: 183 msec    Systemic VTI:  0.22 m MV E velocity: 73.30 cm/s  Systemic Diam: 2.00 cm MV A velocity: 84.00 cm/s MV E/A ratio:  0.87 Dina Rich MD Electronically signed by Dina Rich MD Signature Date/Time: 04/06/2019/2:45:43 PM    Final         Scheduled Meds: . aspirin EC  81 mg Oral Daily  . atorvastatin  80 mg Oral q1800  . Chlorhexidine Gluconate Cloth  6 each Topical Daily  . insulin aspart  0-15 Units Subcutaneous Q4H  . insulin aspart  0-5 Units Subcutaneous QHS  . levothyroxine  88 mcg Oral QAC breakfast  . losartan  25 mg Oral Daily  . metoprolol tartrate  12.5 mg Oral BID  . pantoprazole  40 mg Oral Daily  . sodium chloride flush  3 mL Intravenous Q12H  . sodium chloride flush  3 mL Intravenous Q12H  . ticagrelor  90 mg Oral BID   Continuous Infusions: . sodium chloride 150 mL/hr at 04/07/19 1515  . sodium chloride    . nitroGLYCERIN 16.667 mcg/min (04/07/19 0925)     LOS: 2 days    Time spent: 35 minutes    Alberteen Sam, MD [facility] 04/07/2019, 8:24 PM     Please page though AMION or Epic secure chat:  For [employer], Higher education careers adviser

## 2019-04-07 NOTE — Progress Notes (Addendum)
Progress Note  Patient Name: Gregory Cole Date of Encounter: 04/07/2019  Primary Cardiologist: Nicki Guadalajara, MD (last saw in 2010); currently followed at [facility]  Subjective   Mild recurrent chest pain this morning, currently pain-free.  No shortness of breath, palpitation or orthopnea.  Inpatient Medications    Scheduled Meds: . aspirin EC  81 mg Oral Daily  . atorvastatin  40 mg Oral Daily  . Chlorhexidine Gluconate Cloth  6 each Topical Daily  . insulin aspart  0-15 Units Subcutaneous Q4H  . insulin aspart  0-5 Units Subcutaneous QHS  . levothyroxine  88 mcg Oral QAC breakfast  . losartan  25 mg Oral Daily  . metoprolol tartrate  12.5 mg Oral BID  . pantoprazole  40 mg Oral Daily  . sodium chloride flush  3 mL Intravenous Q12H   Continuous Infusions: . sodium chloride    . sodium chloride 1 mL/kg/hr (04/07/19 0602)  . heparin 1,250 Units/hr (04/07/19 0407)  . nitroGLYCERIN 16.6667 mcg/min (04/05/19 1601)   PRN Meds: sodium chloride, acetaminophen **OR** acetaminophen, albuterol, ondansetron **OR** ondansetron (ZOFRAN) IV, sodium chloride flush, sodium chloride flush   Vital Signs    Vitals:   04/06/19 0420 04/06/19 1411 04/06/19 2110 04/07/19 0416  BP: (!) 156/88 123/69 (!) 142/84 130/66  Pulse: (!) 58 67 67 69  Resp: 20  20 20   Temp: 97.7 F (36.5 C) 98.1 F (36.7 C) 98.8 F (37.1 C) 97.7 F (36.5 C)  TempSrc: Oral Oral Oral Oral  SpO2: 99% 98% 98% 98%  Weight: 88.2 kg   87.5 kg  Height:        Intake/Output Summary (Last 24 hours) at 04/07/2019 0753 Last data filed at 04/07/2019 0602 Gross per 24 hour  Intake 1561.87 ml  Output 1400 ml  Net 161.87 ml   Last 3 Weights 04/07/2019 04/06/2019 04/05/2019  Weight (lbs) 192 lb 12.8 oz 194 lb 8 oz 200 lb  Weight (kg) 87.454 kg 88.225 kg 90.719 kg      Telemetry  SR with PVCs - Personally Reviewed  ECG    No new tracing   Physical Exam   GEN: No acute distress.   Neck: No JVD Cardiac:  RRR, no murmurs, rubs, or gallops.  Respiratory: Clear to auscultation bilaterally. GI: Soft, nontender, non-distended  MS: No edema; No deformity. Neuro:  Nonfocal  Psych: Normal affect   Labs    High Sensitivity Troponin:   Recent Labs  Lab 04/05/19 1511 04/05/19 1759 04/05/19 1934 04/05/19 2207 04/05/19 2327  TROPONINIHS 95* 285* 465* 845* 1,079*      Chemistry Recent Labs  Lab 04/05/19 1110 04/06/19 0612 04/07/19 0439  NA 138 138 140  K 4.1 5.0 4.0  CL 102 104 104  CO2 27 26 26   GLUCOSE 280* 160* 158*  BUN 30* 23 22  CREATININE 1.36* 1.19 1.24  CALCIUM 9.3 8.9 9.0  PROT  --  6.5  --   ALBUMIN  --  3.3*  --   AST  --  47*  --   ALT  --  26  --   ALKPHOS  --  54  --   BILITOT  --  1.2  --   GFRNONAA 52* >60 59*  GFRAA >60 >60 >60  ANIONGAP 9 8 10      Hematology Recent Labs  Lab 04/05/19 1110 04/06/19 0613 04/07/19 0439  WBC 5.8 5.9 7.2  RBC 4.73 4.33 4.46  HGB 12.5* 11.5* 11.6*  HCT 41.3 36.9*  38.1*  MCV 87.3 85.2 85.4  MCH 26.4 26.6 26.0  MCHC 30.3 31.2 30.4  RDW 14.6 14.5 14.4  PLT 231 214 242    BNP Recent Labs  Lab 04/05/19 1139  BNP 55.0     Radiology    DG Chest Portable 1 View  Result Date: 04/05/2019 CLINICAL DATA:  Chest pain EXAM: PORTABLE CHEST 1 VIEW COMPARISON:  11/11/2008 FINDINGS: Bilateral mild interstitial and patchy alveolar airspace opacities. No pleural effusion or pneumothorax. Stable cardiomediastinal silhouette. No acute osseous abnormality. IMPRESSION: Bilateral interstitial and patchy alveolar airspace opacities concerning for mild pulmonary edema versus multilobar pneumonia including atypical viral pneumonia. Electronically Signed   By: Elige Ko   On: 04/05/2019 12:01   ECHOCARDIOGRAM COMPLETE  Result Date: 04/06/2019    ECHOCARDIOGRAM REPORT   Patient Name:   Gregory Cole Date of Exam: 04/06/2019 Medical Rec #:  [id number]       Height:       67.0 in Accession #:    [id number]      Weight:       194.5 lb  Date of Birth:  [birth year]      BSA:          1.998 m Patient Age:    68 years        BP:           156/88 mmHg Patient Gender: M               HR:           58 bpm. Exam Location:  Inpatient Procedure: 2D Echo and Intracardiac Opacification Agent Indications:    Chest Pain 786.50 / R07.9  History:        Patient has no prior history of Echocardiogram examinations.                 CAD, Previous Myocardial Infarction and Acute MI,                 Signs/Symptoms:Chest Pain; Risk Factors:Hypertension, Diabetes                 and Dyslipidemia. GERD.  Sonographer:    Leta Jungling RDCS Referring Phys: [id number] Lamont Dowdy [facility] IMPRESSIONS  1. 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 Echo contrast used, there is no thrombus present within the apical aneurysm. Marland Kitchen Left ventricular ejection fraction, by estimation, is 50%. The left ventricle has low normal function. The left ventricle demonstrates regional wall motion abnormalities (see scoring diagram/findings for description). There is mild left ventricular hypertrophy. Left ventricular diastolic parameters are consistent with Grade I diastolic dysfunction (impaired relaxation). Elevated left atrial pressure.  2. Right ventricular systolic function is normal. The right ventricular size is normal.  3. Left atrial size was mildly dilated.  4. The mitral valve is normal in structure. No evidence of mitral valve regurgitation. No evidence of mitral stenosis.  5. The aortic valve is tricuspid. Aortic valve regurgitation is not visualized. No aortic stenosis is present. 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 Echo contrast used, there is no thrombus present within the apical aneurysm. Left ventricular ejection fraction, by estimation, is 50%. The left ventricle has low normal function. The left ventricle demonstrates regional wall motion abnormalities. Definity contrast agent was given IV to delineate the left ventricular endocardial borders. The left  ventricular internal cavity size was normal in size. There is mild left ventricular hypertrophy. Left ventricular diastolic parameters are consistent with  Grade I diastolic dysfunction (impaired relaxation). Elevated left atrial pressure. Right Ventricle: The right ventricular size is normal. No increase in right ventricular wall thickness. Right ventricular systolic function is normal. Left Atrium: Left atrial size was mildly dilated. Right Atrium: Right atrial size was normal in size. Pericardium: There is no evidence of pericardial effusion. Mitral Valve: The mitral valve is normal in structure. No evidence of mitral valve regurgitation. No evidence of mitral valve stenosis. Tricuspid Valve: The tricuspid valve is normal in structure. Tricuspid valve regurgitation is not demonstrated. No evidence of tricuspid stenosis. Aortic Valve: The aortic valve is tricuspid. Aortic valve regurgitation is not visualized. No aortic stenosis is present. Pulmonic Valve: The pulmonic valve was not well visualized. Pulmonic valve regurgitation is not visualized. No evidence of pulmonic stenosis. Aorta: The aortic root is normal in size and structure. Pulmonary Artery: Indeterminant PASP, inadequate TR jet. IAS/Shunts: The interatrial septum was not well visualized.  LEFT VENTRICLE PLAX 2D LVIDd:         5.20 cm      Diastology LVIDs:         3.70 cm      LV e' lateral:   8.81 cm/s LV PW:         1.10 cm      LV E/e' lateral: 8.3 LV IVS:        1.20 cm      LV e' medial:    3.70 cm/s LVOT diam:     2.00 cm      LV E/e' medial:  19.8 LV SV:         71 LV SV Index:   35 LVOT Area:     3.14 cm  LV Volumes (MOD) LV vol d, MOD A2C: 128.0 ml LV vol d, MOD A4C: 178.0 ml LV vol s, MOD A4C: 83.6 ml LV SV MOD A4C:     178.0 ml RIGHT VENTRICLE RV S prime:     12.00 cm/s TAPSE (M-mode): 2.0 cm LEFT ATRIUM             Index       RIGHT ATRIUM           Index LA diam:        3.70 cm 1.85 cm/m  RA Area:     17.10 cm LA Vol (A2C):   68.0 ml  34.03 ml/m RA Volume:   47.70 ml  23.87 ml/m LA Vol (A4C):   55.6 ml 27.82 ml/m LA Biplane Vol: 66.3 ml 33.18 ml/m  AORTIC VALVE LVOT Vmax:   98.60 cm/s LVOT Vmean:  68.200 cm/s LVOT VTI:    0.225 m  AORTA Ao Root diam: 3.40 cm MITRAL VALVE MV Area (PHT): 4.15 cm    SHUNTS MV Decel Time: 183 msec    Systemic VTI:  0.22 m MV E velocity: 73.30 cm/s  Systemic Diam: 2.00 cm MV A velocity: 84.00 cm/s MV E/A ratio:  0.87 Carlyle Dolly MD Electronically signed by Carlyle Dolly MD Signature Date/Time: 04/06/2019/2:45:43 PM    Final     Cardiac Studies   Echo 04/06/2019 1. 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 Echo contrast used, there is  no thrombus present within the apical aneurysm. Marland Kitchen Left ventricular  ejection fraction, by estimation, is 50%. The left ventricle has low  normal function. The left ventricle  demonstrates regional wall motion abnormalities (see scoring  diagram/findings for description). There is mild left ventricular  hypertrophy. Left ventricular diastolic parameters are consistent  with  Grade I diastolic dysfunction (impaired relaxation).  Elevated left atrial pressure.  2. Right ventricular systolic function is normal. The right ventricular  size is normal.  3. Left atrial size was mildly dilated.  4. The mitral valve is normal in structure. No evidence of mitral valve  regurgitation. No evidence of mitral stenosis.  5. The aortic valve is tricuspid. Aortic valve regurgitation is not  visualized. No aortic stenosis is present.    Patient Profile   Gregory Cole is a 69 y.o. male with known CAD s/p PCI x 3 (2010), T2DM, HTN, and HLD who presents with chest pain found to have elevated hsTn concerning for NSTEMI.    Previously followed by Dr. Tresa Endo but lost follow-up.  Assessment & Plan    1. NSTEMI -Troponin peaked at 1079 on 3/27.  Being treated with IV heparin and nitro gtt.  Had a recurrent chest pressure this morning.  Plan cardiac catheterization later  today. -Continue aspirin, statin, beta-blocker and ACE.  2. HLD -No results found for requested labs within last 8760 hours.  - Continue Lipitor 40mg  qd for now. Check Lipid panel  3. DM - per primary  4. HTN - BP stable on current medications.       For questions or updates, please contact [facility] Please consult [email or web address] for contact info under     Signed , PA  04/07/2019, 7:53 AM     Patient seen and examined. Agree with assessment and plan.  Gregory Cole is status post remote PCI in 2010.  He cannot remember the vessel.  He has been followed at the 2011 and states he has not seen a cardiologist in several years.  He has symptoms concerning for progressive exertional angina.  He developed recurrent chest pain this morning on nitroglycerin and heparin consistent with unstable angina.  High-sensitivity troponin has trended upward to a peak of 1079.  ECG shows sinus bradycardia at 54 bpm.  There are inferior Q waves as well as anteroseptal Q waves with poor R wave progression.I have reviewed the risks, indications, and alternatives to cardiac catheterization, possible angioplasty, and stenting with the patient. Risks include but are not limited to bleeding, infection, vascular injury, stroke, myocardial infection, arrhythmia, kidney injury, radiation-related injury in the case of prolonged fluoroscopy use, emergency cardiac surgery, and death. The patient understands the risks of serious complication is 1-2 in 1000 with diagnostic cardiac cath and 1-2% or less with angioplasty/stenting.    [place], MD, [facility] 04/07/2019 9:38 AM

## 2019-04-07 NOTE — Progress Notes (Signed)
ANTICOAGULATION CONSULT NOTE - Follow-Up  Pharmacy Consult for heparin infusion Indication: chest pain/ACS  No Known Allergies  Patient Measurements: Height: 5\' 7"  (170.2 cm) Weight: 192 lb 12.8 oz (87.5 kg) IBW/kg (Calculated) : 66.1 Heparin Dosing Weight: HEPARIN DW (KG): 85.1  Vital Signs: Temp: 97.7 F (36.5 C) (03/29 0416) Temp Source: Oral (03/29 0416) BP: 130/66 (03/29 0416) Pulse Rate: 69 (03/29 0416)  Labs: Recent Labs    04/05/19 1110 04/05/19 1110 04/05/19 1139 04/05/19 1511 04/05/19 1934 04/05/19 2207 04/05/19 2207 04/05/19 2327 04/06/19 0612 04/06/19 0613 04/06/19 1432 04/07/19 0439  HGB 12.5*   < >  --   --   --   --   --   --   --  11.5*  --  11.6*  HCT 41.3  --   --   --   --   --   --   --   --  36.9*  --  38.1*  PLT 231  --   --   --   --   --   --   --   --  214  --  242  APTT  --   --  32  --   --   --   --   --   --   --   --   --   LABPROT  --   --  13.9  --   --   --   --   --   --   --   --   --   INR  --   --  1.1  --   --   --   --   --   --   --   --   --   HEPARINUNFRC  --   --   --   --   --  0.24*   < >  --  0.24*  --  0.81* 0.67  CREATININE 1.36*  --   --   --   --   --   --   --  1.19  --   --  1.24  TROPONINIHS 11  --   --    < > 465* 845*  --  1,079*  --   --   --   --    < > = values in this interval not displayed.    Estimated Creatinine Clearance: 58.6 mL/min (by C-G formula based on SCr of 1.24 mg/dL).    Assessment: Pharmacy consulted to dose heparin fort his 69 yo male with increased  chest pressure and SOB while walking his dog.  EKG shows no ST elevation.  CBC and baseline coags are WNL.  Heparin level therapeutic this morning at 0.67, CBC stable. Cath planned today.  Goal of Therapy:  Heparin level 0.3-0.7 units/ml Monitor platelets by anticoagulation protocol: Yes   Plan:  Continue heparin 1250 units/h Daily heparin level and CBC Cath today   64, PharmD, BCPS Clinical  Pharmacist [phone] Please check AMION for all [facility] numbers 04/07/2019

## 2019-04-08 ENCOUNTER — Telehealth: Payer: Self-pay | Admitting: Cardiovascular Disease

## 2019-04-08 DIAGNOSIS — E118 Type 2 diabetes mellitus with unspecified complications: Secondary | ICD-10-CM

## 2019-04-08 LAB — BASIC METABOLIC PANEL
Anion gap: 9 (ref 5–15)
BUN: 14 mg/dL (ref 8–23)
CO2: 24 mmol/L (ref 22–32)
Calcium: 8.8 mg/dL — ABNORMAL LOW (ref 8.9–10.3)
Chloride: 108 mmol/L (ref 98–111)
Creatinine, Ser: 1.19 mg/dL (ref 0.61–1.24)
GFR calc Af Amer: >60 mL/min (ref >60)
GFR calc non Af Amer: >60 mL/min (ref >60)
Glucose, Bld: 135 mg/dL — ABNORMAL HIGH (ref 70–99)
Potassium: 3.8 mmol/L (ref 3.5–5.1)
Sodium: 141 mmol/L (ref 135–145)

## 2019-04-08 LAB — CBC
HCT: 36.4 % — ABNORMAL LOW (ref 39.0–52.0)
Hemoglobin: 11.1 g/dL — ABNORMAL LOW (ref 13.0–17.0)
MCH: 26.1 pg (ref 26.0–34.0)
MCHC: 30.5 g/dL (ref 30.0–36.0)
MCV: 85.6 fL (ref 80.0–100.0)
Platelets: 231 10*3/uL (ref 150–400)
RBC: 4.25 MIL/uL (ref 4.22–5.81)
RDW: 14.5 % (ref 11.5–15.5)
WBC: 6.3 10*3/uL (ref 4.0–10.5)
nRBC: 0 % (ref 0.0–0.2)

## 2019-04-08 LAB — GLUCOSE, CAPILLARY
Glucose-Capillary: 104 mg/dL — ABNORMAL HIGH (ref 70–99)
Glucose-Capillary: 130 mg/dL — ABNORMAL HIGH (ref 70–99)

## 2019-04-08 MED ORDER — METOPROLOL TARTRATE 25 MG PO TABS: 25.0000 mg | ORAL_TABLET | Freq: Two times a day (BID) | ORAL | 0 refills | Status: DC

## 2019-04-08 MED ORDER — ATORVASTATIN CALCIUM 80 MG PO TABS: 80.0000 mg | ORAL_TABLET | Freq: Every day | ORAL | 0 refills | Status: DC

## 2019-04-08 MED ORDER — METOPROLOL TARTRATE 25 MG PO TABS: 25.0000 mg | ORAL_TABLET | Freq: Two times a day (BID) | ORAL | Status: DC

## 2019-04-08 MED ORDER — TICAGRELOR 90 MG PO TABS: 90.0000 mg | ORAL_TABLET | Freq: Two times a day (BID) | ORAL | 11 refills | Status: DC

## 2019-04-08 MED ORDER — LOSARTAN POTASSIUM 50 MG PO TABS
100.0000 mg | ORAL_TABLET | Freq: Every day | ORAL | Status: DC
  Administered 2019-04-08: 09:00:00 100 mg via ORAL
  Filled 2019-04-08: qty 2

## 2019-04-08 MED FILL — BRILINTA 90 MG TABLET: 90 | 30 days supply | Qty: 60 | Fill #0

## 2019-04-08 MED FILL — METOPROLOL TARTRATE 25 MG T: 25 | 3 days supply | Qty: 60 | Fill #0

## 2019-04-08 MED FILL — ATORVASTATIN CALCIUM 80 MG: 80 | 30 days supply | Qty: 30 | Fill #0

## 2019-04-08 NOTE — Telephone Encounter (Signed)
Patient has TOC appointment with Azalee Course on 04/22/2019 at 11:15am per Vin.

## 2019-04-08 NOTE — Progress Notes (Signed)
CARDIAC REHAB PHASE I   PRE:  Rate/Rhythm: 77 SR    BP: sitting 157/82    SaO2:   MODE:  Ambulation: 400 ft   POST:  Rate/Rhythm: 98 SR    BP: sitting 158/88     SaO2:   Tolerated well except he did have LOB x1, able to regain stability. Sts this happens to him sometimes and he uses a walking stick for his 2 mile walk with his 60 lb dog. Extensive discussion of staying safe on Brilinta and having someone else walk his dog for a while. Discussed MI, stent, restrictions, Brilinta, diet, exercise, NTG and CRPII. Will refer to [facility] CRPII. He understands the importance of Brilinta. He plans to work on his diet.  Pt is interested in participating in Virtual Cardiac and Pulmonary Rehab. Pt advised that Virtual Cardiac and Pulmonary Rehab is provided at no cost to the patient.  Checklist:  1. Pt has smart device  ie smartphone and/or ipad for downloading an app  Yes 2. Reliable internet/wifi service    Yes 3. Understands how to use their smartphone and navigate within an app.  Yes Pt verbalized understanding and is in agreement.  [id number]  Harriet Masson CES, ACSM 04/08/2019 10:31 AM

## 2019-04-08 NOTE — Progress Notes (Signed)
TR band removed-level 1 

## 2019-04-08 NOTE — Care Management Important Message (Signed)
Important Message  Patient Details  Name: Gregory Cole MRN: [id number] Date of Birth: 1950/05/15   Medicare Important Message Given:  Yes     Renie Ora 04/08/2019, 10:13 AM

## 2019-04-08 NOTE — TOC Benefit Eligibility Note (Signed)
Transition of Care [facility]) Benefit Eligibility Note    Patient Details  Name: Gregory Cole MRN: [id number] Date of Birth: 1950/03/27   Medication/Dose: Prasugrel 10 mg     Tier: 2 Drug  Prescription Coverage Preferred Pharmacy: Any retail pharmacy  Spoke with Person/Company/Phone Number:: Allison/ [employer] / [phone]  Co-Pay: 44.55 for a 30 day supply Mail Order or Retail  Prior Approval: No  Deductible: Unmet(260.00)  Additional Notes: Brilinta 90mg  is 312.00 for a 30 day supply does not require Prior Phone Number: 04/08/2019, 9:46 AM

## 2019-04-08 NOTE — Discharge Summary (Signed)
Physician Discharge Summary  KJUAN SEIPP ZOX:[id number] DOB: 1950/05/06 DOA: 04/05/2019  PCP: Gwenlyn Fudge, FNP  Admit date: 04/05/2019 Discharge date: 04/08/2019  Admitted From: Home Disposition:  Home  Recommendations for Outpatient Follow-up:  1. Follow up with PCP in 1-2 weeks 2. Follow up with Cardiology as scheduled   Discharge Condition:Stable CODE STATUS:Full Diet recommendation: Regular   Brief/Interim Summary: 69yo with hx CAD with prior MI x3 stent placements, DM, HTN, GERD, HLD presented with chest pains and sob. Troponins noted to be elevated and rising. Cardiology consulted and pt transferred to [facility] for further cardiac work up  Discharge Diagnoses:  Active Problems:   Non-ST elevation (NSTEMI) myocardial infarction (HCC)   Unstable angina (HCC)  Unstable angina in setting of CAD with prior MI -Pt was transferred to [facility] for cardiac workup -Seen by Cardiology, Pt underwent heart cath with resultant stent placement -Continued on ASA 81 with brilinta for a minimum of one year, cont ASA, increased atorvastatin to , increased metoprolol tartrate to     AKI -Baseline creatinine approximately 0.9-1.1 with presenting creatinine 1.36 -Given gentle IVF -Cr trended down  Patchy infiltrates in bilateral lungs -Patient does state he has had a mild cough and wife has been sick as well -Covid testing neg -Does not appear to have frank signs or symptoms of pneumonia, procal neg -No leukocytosis noted -Remained stable  Type 2 diabetes with hyperglycemia -Plan to check hemoglobin A1c -Hold oral meds -cont SSI for now  Hypertension-currently stable -Had remained stable this stay  Dyslipidemia -Continue atorvastatin, dose increased per cardiology above -LFT's stable  Hypothyroidism -Continue Synthroid -TSH 0.335  Discharge Instructions  Discharge Instructions    Amb Referral to Cardiac Rehabilitation   Complete by: As directed     Diagnosis:  Coronary Stents NSTEMI PTCA     After initial evaluation and assessments completed: Virtual Based Care may be provided alone or in conjunction with Phase 2 Cardiac Rehab based on patient barriers.: Yes     Allergies as of 04/08/2019   No Known Allergies     Medication List    STOP taking these medications   furosemide 20 MG tablet Commonly known as: LASIX   isosorbide mononitrate 120 MG 24 hr tablet Commonly known as: IMDUR     TAKE these medications   aspirin EC 81 MG tablet Take 81 mg by mouth daily. Evening 1600   atorvastatin 80 MG tablet Commonly known as: LIPITOR Take 1 tablet (80 mg total) by mouth daily at 6 PM. What changed:   medication strength  how much to take  when to take this  additional instructions   FISH OIL PO Take 1,000 mg by mouth 2 (two) times daily.   levothyroxine 88 MCG tablet Commonly known as: SYNTHROID Take 88 mcg by mouth daily before breakfast.   losartan 100 MG tablet Commonly known as: COZAAR Take 100 mg by mouth daily. Evening 1600   metFORMIN 500 MG tablet Commonly known as: GLUCOPHAGE Take 500 mg by mouth 2 (two) times daily.   metoprolol tartrate 25 MG tablet Commonly known as: LOPRESSOR Take 1 tablet (25 mg total) by mouth 2 (two) times daily. What changed:   when to take this  additional instructions   nitroGLYCERIN 0.4 MG SL tablet Commonly known as: NITROSTAT Place under the tongue.   NovoLOG Mix 70/30 FlexPen (70-30) 100 UNIT/ML FlexPen Generic drug: insulin aspart protamine - aspart Inject 36 Units into the skin daily. Before dinner.   omeprazole 20  MG capsule Commonly known as: PRILOSEC Take 20 mg by mouth daily. Evening 1600   pioglitazone 45 MG tablet Commonly known as: ACTOS Take 45 mg by mouth daily. Evening 1600   Proventil HFA 108 (90 Base) MCG/ACT inhaler Generic drug: albuterol Inhale 2 puffs into the lungs every 6 (six) hours as needed for wheezing or shortness of breath.    ticagrelor 90 MG Tabs tablet Commonly known as: BRILINTA Take 1 tablet (90 mg total) by mouth 2 (two) times daily.      Follow-up Information    Azalee Course, [place]. Go on 04/22/2019.   Specialties: Cardiology, Radiology Why: @11 :15am for hospital follow up with Dr. PA Contact information: [address] [place] [id number]        Follow up with your PCP in 1-2 weeks Follow up.          No Known Allergies  Consultations:  Cardiology  Procedures/Studies: CARDIAC CATHETERIZATION  Result Date: 04/07/2019  Prox RCA lesion is 35% stenosed.  RPDA-1 lesion is 70% stenosed.  RPDA-2 lesion is 50% stenosed.  Mid LAD to Dist LAD lesion is 100% stenosed.  Prox LAD lesion is 20% stenosed.  Mid LAD lesion is 20% stenosed.  Previously placed Ramus stent (unknown type) is widely patent.  2nd RPL lesion is 50% stenosed.  Mid RCA lesion is 80% stenosed.  Post intervention, there is a 0% residual stenosis.  A stent was successfully placed.  Multivessel CAD with previously noted old total occlusion of the mid LAD with mild proximal to mid narrowing of 20 to 30% and evidence for faint bridging collaterals from septal perforating; widely patent stent to the proximal ramus intermediate vessel; normal left circumflex coronary artery; large dominant RCA with mild to moderate diffuse irregularity with narrowing of 30% stenoses proximally, diffuse 80% eccentric stenosis in the midportion of the vessel between the RV marginal branch and acute margin, 70% focal ostial PDA stenosis followed by 50% mid stenosis, 50% stenosis in inferior LV branch. Low normal LV function with with EF estimated 50% with mid inferior hypocontractility and suggestion of possible distal apical hypocontractility.  LVEDP 9 mm. Successful PCI to the dominant RCA with ultimate insertion of a 3.5 x 22 mm Resolute Onyx stent postdilated to 3.6 mm with the stenosis being reduced to 0% and brisk TIMI-3  flow.  Following successful reperfusion there was improved collateralization to the distal LAD via the distal RCA branches.  There was a ery small caliber marginal branch that arose from the mid RCA in the region of the stented segment which had 95% stenosis prior to intervention and was unchanged with TIMI-3 flow following stenting. RECOMMENDATION: DAPT for minimum of a year.  Medical therapy for concomitant CAD.  Aggressive lipid-lowering therapy with target LDL less than 70%.  Continue IV nitroglycerin today with plans to wean and DC by a.m.   DG Chest Portable 1 View  Result Date: 04/05/2019 CLINICAL DATA:  Chest pain EXAM: PORTABLE CHEST 1 VIEW COMPARISON:  11/11/2008 FINDINGS: Bilateral mild interstitial and patchy alveolar airspace opacities. No pleural effusion or pneumothorax. Stable cardiomediastinal silhouette. No acute osseous abnormality. IMPRESSION: Bilateral interstitial and patchy alveolar airspace opacities concerning for mild pulmonary edema versus multilobar pneumonia including atypical viral pneumonia. Electronically Signed   By: 13/03/2008   On: 04/05/2019 12:01   ECHOCARDIOGRAM COMPLETE  Result Date: 04/06/2019    ECHOCARDIOGRAM REPORT   Patient Name:   MALAKY TETRAULT Bernardi Date of Exam: 04/06/2019 Medical Rec #:  [id number]       Height:       67.0 in Accession #:    [id number]      Weight:       194.5 lb Date of Birth:  [birth year]      BSA:          1.998 m Patient Age:    68 years        BP:           156/88 mmHg Patient Gender: M               HR:           58 bpm. Exam Location:  Inpatient Procedure: 2D Echo and Intracardiac Opacification Agent Indications:    Chest Pain 786.50 / R07.9  History:        Patient has no prior history of Echocardiogram examinations.                 CAD, Previous Myocardial Infarction and Acute MI,                 Signs/Symptoms:Chest Pain; Risk Factors:Hypertension, Diabetes                 and Dyslipidemia. GERD.  Sonographer:    Leta Jungling RDCS  Referring Phys: [id number] [facility] IMPRESSIONS  1. 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 Echo contrast used, there is no thrombus present within the apical aneurysm. Marland Kitchen Left ventricular ejection fraction, by estimation, is 50%. The left ventricle has low normal function. The left ventricle demonstrates regional wall motion abnormalities (see scoring diagram/findings for description). There is mild left ventricular hypertrophy. Left ventricular diastolic parameters are consistent with Grade I diastolic dysfunction (impaired relaxation). Elevated left atrial pressure.  2. Right ventricular systolic function is normal. The right ventricular size is normal.  3. Left atrial size was mildly dilated.  4. The mitral valve is normal in structure. No evidence of mitral valve regurgitation. No evidence of mitral stenosis.  5. The aortic valve is tricuspid. Aortic valve regurgitation is not visualized. No aortic stenosis is present. 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 Echo contrast used, there is no thrombus present within the apical aneurysm. Left ventricular ejection fraction, by estimation, is 50%. The left ventricle has low normal function. The left ventricle demonstrates regional wall motion abnormalities. Definity contrast agent was given IV to delineate the left ventricular endocardial borders. The left ventricular internal cavity size was normal in size. There is mild left ventricular hypertrophy. Left ventricular diastolic parameters are consistent with Grade I diastolic dysfunction (impaired relaxation). Elevated left atrial pressure. Right Ventricle: The right ventricular size is normal. No increase in right ventricular wall thickness. Right ventricular systolic function is normal. Left Atrium: Left atrial size was mildly dilated. Right Atrium: Right atrial size was normal in size. Pericardium: There is no evidence of pericardial effusion. Mitral Valve: The mitral valve is normal in  structure. No evidence of mitral valve regurgitation. No evidence of mitral valve stenosis. Tricuspid Valve: The tricuspid valve is normal in structure. Tricuspid valve regurgitation is not demonstrated. No evidence of tricuspid stenosis. Aortic Valve: The aortic valve is tricuspid. Aortic valve regurgitation is not visualized. No aortic stenosis is present. Pulmonic Valve: The pulmonic valve was not well visualized. Pulmonic valve regurgitation is not visualized. No evidence of pulmonic stenosis. Aorta: The aortic root is normal in size and structure. Pulmonary Artery: Indeterminant PASP, inadequate TR jet. IAS/Shunts:  The interatrial septum was not well visualized.  LEFT VENTRICLE PLAX 2D LVIDd:         5.20 cm      Diastology LVIDs:         3.70 cm      LV e' lateral:   8.81 cm/s LV PW:         1.10 cm      LV E/e' lateral: 8.3 LV IVS:        1.20 cm      LV e' medial:    3.70 cm/s LVOT diam:     2.00 cm      LV E/e' medial:  19.8 LV SV:         71 LV SV Index:   35 LVOT Area:     3.14 cm  LV Volumes (MOD) LV vol d, MOD A2C: 128.0 ml LV vol d, MOD A4C: 178.0 ml LV vol s, MOD A4C: 83.6 ml LV SV MOD A4C:     178.0 ml RIGHT VENTRICLE RV S prime:     12.00 cm/s TAPSE (M-mode): 2.0 cm LEFT ATRIUM             Index       RIGHT ATRIUM           Index LA diam:        3.70 cm 1.85 cm/m  RA Area:     17.10 cm LA Vol (A2C):   68.0 ml 34.03 ml/m RA Volume:   47.70 ml  23.87 ml/m LA Vol (A4C):   55.6 ml 27.82 ml/m LA Biplane Vol: 66.3 ml 33.18 ml/m  AORTIC VALVE LVOT Vmax:   98.60 cm/s LVOT Vmean:  68.200 cm/s LVOT VTI:    0.225 m  AORTA Ao Root diam: 3.40 cm MITRAL VALVE MV Area (PHT): 4.15 cm    SHUNTS MV Decel Time: 183 msec    Systemic VTI:  0.22 m MV E velocity: 73.30 cm/s  Systemic Diam: 2.00 cm MV A velocity: 84.00 cm/s MV E/A ratio:  0.87 Dina RichJonathan Branch MD Electronically signed by Dina RichJonathan Branch MD Signature Date/Time: 04/06/2019/2:45:43 PM    Final      Subjective: Eager to go home  Discharge  Exam: Vitals:   04/08/19 0853 04/08/19 0904  BP:  (!) 157/82  Pulse: 85   Resp:    Temp:    SpO2: 99%    Vitals:   04/08/19 0100 04/08/19 0423 04/08/19 0853 04/08/19 0904  BP: (!) 161/74 (!) 151/80  (!) 157/82  Pulse: 74 67 85   Resp:  16    Temp: 98 F (36.7 C) 98 F (36.7 C)    TempSrc: Oral Oral    SpO2:  96% 99%   Weight:  88 kg    Height:        General: Pt is alert, awake, not in acute distress Cardiovascular: RRR, S1/S2 +, no rubs, no gallops Respiratory: CTA bilaterally, no wheezing, no rhonchi Abdominal: Soft, NT, ND, bowel sounds + Extremities: no edema, no cyanosis   The results of significant diagnostics from this hospitalization (including imaging, microbiology, ancillary and laboratory) are listed below for reference.     Microbiology: Recent Results (from the past 240 hour(s))  Respiratory Panel by PCR     Status: None   Collection Time: 04/05/19 11:27 AM   Specimen: Nasopharyngeal Swab; Respiratory  Result Value Ref Range Status   Adenovirus NOT DETECTED NOT DETECTED Final   Coronavirus 229E NOT DETECTED NOT DETECTED  Final    Comment: (NOTE) The Coronavirus on the Respiratory Panel, DOES NOT test for the novel  Coronavirus (2019 nCoV)    Coronavirus HKU1 NOT DETECTED NOT DETECTED Final   Coronavirus NL63 NOT DETECTED NOT DETECTED Final   Coronavirus OC43 NOT DETECTED NOT DETECTED Final   Metapneumovirus NOT DETECTED NOT DETECTED Final   Rhinovirus / Enterovirus NOT DETECTED NOT DETECTED Final   Influenza A NOT DETECTED NOT DETECTED Final   Influenza B NOT DETECTED NOT DETECTED Final   Parainfluenza Virus 1 NOT DETECTED NOT DETECTED Final   Parainfluenza Virus 2 NOT DETECTED NOT DETECTED Final   Parainfluenza Virus 3 NOT DETECTED NOT DETECTED Final   Parainfluenza Virus 4 NOT DETECTED NOT DETECTED Final   Respiratory Syncytial Virus NOT DETECTED NOT DETECTED Final   Bordetella pertussis NOT DETECTED NOT DETECTED Final   Chlamydophila pneumoniae  NOT DETECTED NOT DETECTED Final   Mycoplasma pneumoniae NOT DETECTED NOT DETECTED Final    Comment: Performed at [facility], [address] [place], [place] [address]  SARS CORONAVIRUS 2 (TAT 6-24 HRS) Nasopharyngeal Nasopharyngeal Swab     Status: None   Collection Time: 04/05/19 11:27 AM   Specimen: Nasopharyngeal Swab  Result Value Ref Range Status   SARS Coronavirus 2 NEGATIVE NEGATIVE Final    Comment: (NOTE) SARS-CoV-2 target nucleic acids are NOT DETECTED. The SARS-CoV-2 RNA is generally detectable in upper and lower respiratory specimens during the acute phase of infection. Negative results do not preclude SARS-CoV-2 infection, do not rule out co-infections with other pathogens, and should not be used as the sole basis for treatment or other patient management decisions. Negative results must be combined with clinical observations, patient history, and epidemiological information. The expected result is Negative. Fact Sheet for Patients: HairSlick.no Fact Sheet for Healthcare Providers: quierodirigir.com This test is not yet approved or cleared by the [place] FDA and  has been authorized for detection and/or diagnosis of SARS-CoV-2 by FDA under an Emergency Use Authorization (EUA). This EUA will remain  in effect (meaning this test can be used) for the duration of the COVID-19 declaration under Section 56 4(b)(1) of the Act, 21 U.S.C. section 360bbb-3(b)(1), unless the authorization is terminated or revoked sooner. Performed at [facility], [address], [place], [place] [address]      Labs: BNP (last 3 results) Recent Labs    04/05/19 1139  BNP 55.0   Basic Metabolic Panel: Recent Labs  Lab 04/05/19 1110 04/06/19 0612 04/07/19 0439 04/08/19 0449  NA 138 138 140 141  K 4.1 5.0 4.0 3.8  CL 102 104 104 108  CO2 GLUCOSE 280* 160* 158* 135*  BUN 30* CREATININE  1.36* 1.19 1.24 1.19  CALCIUM 9.3 8.9 9.0 8.8*   Liver Function Tests: Recent Labs  Lab 04/06/19 0612  AST 47*  ALT 26  ALKPHOS 54  BILITOT 1.2  PROT 6.5  ALBUMIN 3.3*   No results for input(s): LIPASE, AMYLASE in the last 168 hours. No results for input(s): AMMONIA in the last 168 hours. CBC: Recent Labs  Lab 04/05/19 1110 04/06/19 0613 04/07/19 0439 04/08/19 0449  WBC 5.8 5.9 7.2 6.3  HGB 12.5* 11.5* 11.6* 11.1*  HCT 41.3 36.9* 38.1* 36.4*  MCV 87.3 85.2 85.4 85.6  PLT 231 214 242 231   Cardiac Enzymes: No results for input(s): CKTOTAL, CKMB, CKMBINDEX, TROPONINI in the last 168 hours. BNP: Invalid input(s): POCBNP CBG:  Recent Labs  Lab 04/07/19 1145 04/07/19 1603 04/07/19 2136 04/08/19 0059 04/08/19 0732  GLUCAP 142* 114* 139* 104* 130*   D-Dimer No results for input(s): DDIMER in the last 72 hours. Hgb A1c Recent Labs    04/05/19 1110  HGBA1C 8.8*   Lipid Profile Recent Labs    04/07/19 0937  CHOL 122  HDL 30*  LDLCALC 65  TRIG 134  CHOLHDL 4.1   Thyroid function studies Recent Labs    04/05/19 1110  TSH 0.335*   Anemia work up No results for input(s): VITAMINB12, FOLATE, FERRITIN, TIBC, IRON, RETICCTPCT in the last 72 hours. Urinalysis No results found for: COLORURINE, APPEARANCEUR, LABSPEC, PHURINE, GLUCOSEU, HGBUR, BILIRUBINUR, KETONESUR, PROTEINUR, UROBILINOGEN, NITRITE, LEUKOCYTESUR Sepsis Labs Invalid input(s): PROCALCITONIN,  WBC,  LACTICIDVEN Microbiology Recent Results (from the past 240 hour(s))  Respiratory Panel by PCR     Status: None   Collection Time: 04/05/19 11:27 AM   Specimen: Nasopharyngeal Swab; Respiratory  Result Value Ref Range Status   Adenovirus NOT DETECTED NOT DETECTED Final   Coronavirus 229E NOT DETECTED NOT DETECTED Final    Comment: (NOTE) The Coronavirus on the Respiratory Panel, DOES NOT test for the novel  Coronavirus (2019 nCoV)    Coronavirus HKU1 NOT DETECTED NOT DETECTED Final    Coronavirus NL63 NOT DETECTED NOT DETECTED Final   Coronavirus OC43 NOT DETECTED NOT DETECTED Final   Metapneumovirus NOT DETECTED NOT DETECTED Final   Rhinovirus / Enterovirus NOT DETECTED NOT DETECTED Final   Influenza A NOT DETECTED NOT DETECTED Final   Influenza B NOT DETECTED NOT DETECTED Final   Parainfluenza Virus 1 NOT DETECTED NOT DETECTED Final   Parainfluenza Virus 2 NOT DETECTED NOT DETECTED Final   Parainfluenza Virus 3 NOT DETECTED NOT DETECTED Final   Parainfluenza Virus 4 NOT DETECTED NOT DETECTED Final   Respiratory Syncytial Virus NOT DETECTED NOT DETECTED Final   Bordetella pertussis NOT DETECTED NOT DETECTED Final   Chlamydophila pneumoniae NOT DETECTED NOT DETECTED Final   Mycoplasma pneumoniae NOT DETECTED NOT DETECTED Final    Comment: Performed at [facility], [address] [place], [place] [address]  SARS CORONAVIRUS 2 (TAT 6-24 HRS) Nasopharyngeal Nasopharyngeal Swab     Status: None   Collection Time: 04/05/19 11:27 AM   Specimen: Nasopharyngeal Swab  Result Value Ref Range Status   SARS Coronavirus 2 NEGATIVE NEGATIVE Final    Comment: (NOTE) SARS-CoV-2 target nucleic acids are NOT DETECTED. The SARS-CoV-2 RNA is generally detectable in upper and lower respiratory specimens during the acute phase of infection. Negative results do not preclude SARS-CoV-2 infection, do not rule out co-infections with other pathogens, and should not be used as the sole basis for treatment or other patient management decisions. Negative results must be combined with clinical observations, patient history, and epidemiological information. The expected result is Negative. Fact Sheet for Patients: SugarRoll.be Fact Sheet for Healthcare Providers: https://www.woods-mathews.com/ This test is not yet approved or cleared by the [place] FDA and  has been authorized for detection and/or diagnosis of SARS-CoV-2 by FDA under an  Emergency Use Authorization (EUA). This EUA will remain  in effect (meaning this test can be used) for the duration of the COVID-19 declaration under Section 56 4(b)(1) of the Act, 21 U.S.C. section 360bbb-3(b)(1), unless the authorization is terminated or revoked sooner. Performed at [facility], [place] [address], [place], [place] [address]    Time spent: 30 min  SIGNED:   Marylu Lund, MD  [facility] 04/08/2019, 11:03 AM  If 7PM-7AM, please contact night-coverage

## 2019-04-08 NOTE — Telephone Encounter (Signed)
Pt currently admitted. TOC call for 3/31. 

## 2019-04-08 NOTE — Progress Notes (Addendum)
Progress Note  Patient Name: Gregory Cole Date of Encounter: 04/08/2019  Primary Cardiologist: Nicki Guadalajara, MD (last saw in 2010); currently followed at [facility]  Subjective   No recurrent chest pain or shortness of breath.  Inpatient Medications    Scheduled Meds: . aspirin EC  81 mg Oral Daily  . atorvastatin  80 mg Oral q1800  . Chlorhexidine Gluconate Cloth  6 each Topical Daily  . insulin aspart  0-15 Units Subcutaneous Q4H  . insulin aspart  0-5 Units Subcutaneous QHS  . levothyroxine  88 mcg Oral QAC breakfast  . losartan  100 mg Oral Daily  . metoprolol tartrate  12.5 mg Oral BID  . pantoprazole  40 mg Oral Daily  . sodium chloride flush  3 mL Intravenous Q12H  . sodium chloride flush  3 mL Intravenous Q12H  . ticagrelor  90 mg Oral BID   Continuous Infusions: . sodium chloride 500 mL (04/08/19 0047)  . sodium chloride    . nitroGLYCERIN Stopped (04/08/19 0851)   PRN Meds: sodium chloride, acetaminophen **OR** acetaminophen, acetaminophen, albuterol, diazepam, ondansetron (ZOFRAN) IV, ondansetron **OR** [DISCONTINUED] ondansetron (ZOFRAN) IV, sodium chloride flush, sodium chloride flush   Vital Signs    Vitals:   04/07/19 2050 04/08/19 0100 04/08/19 0423 04/08/19 0853  BP: (!) 151/78 (!) 161/74 (!) 151/80   Pulse: 74 74 67 85  Resp:   16   Temp: 98 F (36.7 C) 98 F (36.7 C) 98 F (36.7 C)   TempSrc: Oral Oral Oral   SpO2:   96% 99%  Weight:   88 kg   Height:        Intake/Output Summary (Last 24 hours) at 04/08/2019 0857 Last data filed at 04/08/2019 0851 Gross per 24 hour  Intake 1535.38 ml  Output 1550 ml  Net -14.62 ml   Last 3 Weights 04/08/2019 04/07/2019 04/06/2019  Weight (lbs) 193 lb 14.4 oz 192 lb 12.8 oz 194 lb 8 oz  Weight (kg) 87.952 kg 87.454 kg 88.225 kg      Telemetry    Normal sinus rhythm- Personally Reviewed  ECG    Normal sinus rhythm at rate of 81 bpm, improved anterior lateral T wave inversion- Personally  Reviewed  Physical Exam   GEN: No acute distress.   Neck: No JVD Cardiac: RRR, no murmurs, rubs, or gallops.  Right radial cath site without hematoma or ecchymosis. Respiratory: Clear to auscultation bilaterally. GI: Soft, nontender, non-distended  MS: No edema; No deformity. Neuro:  Nonfocal  Psych: Normal affect   Labs    High Sensitivity Troponin:   Recent Labs  Lab 04/05/19 1511 04/05/19 1759 04/05/19 1934 04/05/19 2207 04/05/19 2327  TROPONINIHS 95* 285* 465* 845* 1,079*      Chemistry Recent Labs  Lab 04/06/19 0612 04/07/19 0439 04/08/19 0449  NA 138 140 141  K 5.0 4.0 3.8  CL 104 104 108  CO2 26 26 24   GLUCOSE 160* 158* 135*  BUN 23 22 14   CREATININE 1.19 1.24 1.19  CALCIUM 8.9 9.0 8.8*  PROT 6.5  --   --   ALBUMIN 3.3*  --   --   AST 47*  --   --   ALT 26  --   --   ALKPHOS 54  --   --   BILITOT 1.2  --   --   GFRNONAA >60 59* >60  GFRAA >60 >60 >60  ANIONGAP 8 10 9      Hematology Recent Labs  Lab 04/06/19 0613 04/07/19 0439 04/08/19 0449  WBC 5.9 7.2 6.3  RBC 4.33 4.46 4.25  HGB 11.5* 11.6* 11.1*  HCT 36.9* 38.1* 36.4*  MCV 85.2 85.4 85.6  MCH 26.6 26.0 26.1  MCHC 31.2 30.4 30.5  RDW 14.5 14.4 14.5  PLT 214 242 231    BNP Recent Labs  Lab 04/05/19 1139  BNP 55.0     Radiology    CARDIAC CATHETERIZATION  Result Date: 04/07/2019  Prox RCA lesion is 35% stenosed.  RPDA-1 lesion is 70% stenosed.  RPDA-2 lesion is 50% stenosed.  Mid LAD to Dist LAD lesion is 100% stenosed.  Prox LAD lesion is 20% stenosed.  Mid LAD lesion is 20% stenosed.  Previously placed Ramus stent (unknown type) is widely patent.  2nd RPL lesion is 50% stenosed.  Mid RCA lesion is 80% stenosed.  Post intervention, there is a 0% residual stenosis.  A stent was successfully placed.  Multivessel CAD with previously noted old total occlusion of the mid LAD with mild proximal to mid narrowing of 20 to 30% and evidence for faint bridging collaterals from  septal perforating; widely patent stent to the proximal ramus intermediate vessel; normal left circumflex coronary artery; large dominant RCA with mild to moderate diffuse irregularity with narrowing of 30% stenoses proximally, diffuse 80% eccentric stenosis in the midportion of the vessel between the RV marginal branch and acute margin, 70% focal ostial PDA stenosis followed by 50% mid stenosis, 50% stenosis in inferior LV branch. Low normal LV function with with EF estimated 50% with mid inferior hypocontractility and suggestion of possible distal apical hypocontractility.  LVEDP 9 mm. Successful PCI to the dominant RCA with ultimate insertion of a 3.5 x 22 mm Resolute Onyx stent postdilated to 3.6 mm with the stenosis being reduced to 0% and brisk TIMI-3 flow.  Following successful reperfusion there was improved collateralization to the distal LAD via the distal RCA branches.  There was a ery small caliber marginal branch that arose from the mid RCA in the region of the stented segment which had 95% stenosis prior to intervention and was unchanged with TIMI-3 flow following stenting. RECOMMENDATION: DAPT for minimum of a year.  Medical therapy for concomitant CAD.  Aggressive lipid-lowering therapy with target LDL less than 70%.  Continue IV nitroglycerin today with plans to wean and DC by a.m.   ECHOCARDIOGRAM COMPLETE  Result Date: 04/06/2019    ECHOCARDIOGRAM REPORT   Patient Name:   Gregory Cole Date of Exam: 04/06/2019 Medical Rec #:  [id number]       Height:       67.0 in Accession #:    [id number]      Weight:       194.5 lb Date of Birth:  1950-11-26      BSA:          1.998 m Patient Age:    68 years        BP:           156/88 mmHg Patient Gender: M               HR:           58 bpm. Exam Location:  Inpatient Procedure: 2D Echo and Intracardiac Opacification Agent Indications:    Chest Pain 786.50 / R07.9  History:        Patient has no prior history of Echocardiogram examinations.  CAD, Previous Myocardial Infarction and Acute MI,                 Signs/Symptoms:Chest Pain; Risk Factors:Hypertension, Diabetes                 and Dyslipidemia. GERD.  Sonographer:    Leta Junglingiffany Cooper RDCS Referring Phys: [id number] Lamont DowdyRATIK D [facility] Hospital[facility] IMPRESSIONS  1. 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 Echo contrast used, there is no thrombus present within the apical aneurysm. Marland Kitchen. Left ventricular ejection fraction, by estimation, is 50%. The left ventricle has low normal function. The left ventricle demonstrates regional wall motion abnormalities (see scoring diagram/findings for description). There is mild left ventricular hypertrophy. Left ventricular diastolic parameters are consistent with Grade I diastolic dysfunction (impaired relaxation). Elevated left atrial pressure.  2. Right ventricular systolic function is normal. The right ventricular size is normal.  3. Left atrial size was mildly dilated.  4. The mitral valve is normal in structure. No evidence of mitral valve regurgitation. No evidence of mitral stenosis.  5. The aortic valve is tricuspid. Aortic valve regurgitation is not visualized. No aortic stenosis is present. 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 Echo contrast used, there is no thrombus present within the apical aneurysm. Left ventricular ejection fraction, by estimation, is 50%. The left ventricle has low normal function. The left ventricle demonstrates regional wall motion abnormalities. Definity contrast agent was given IV to delineate the left ventricular endocardial borders. The left ventricular internal cavity size was normal in size. There is mild left ventricular hypertrophy. Left ventricular diastolic parameters are consistent with Grade I diastolic dysfunction (impaired relaxation). Elevated left atrial pressure. Right Ventricle: The right ventricular size is normal. No increase in right ventricular wall thickness. Right ventricular systolic function is  normal. Left Atrium: Left atrial size was mildly dilated. Right Atrium: Right atrial size was normal in size. Pericardium: There is no evidence of pericardial effusion. Mitral Valve: The mitral valve is normal in structure. No evidence of mitral valve regurgitation. No evidence of mitral valve stenosis. Tricuspid Valve: The tricuspid valve is normal in structure. Tricuspid valve regurgitation is not demonstrated. No evidence of tricuspid stenosis. Aortic Valve: The aortic valve is tricuspid. Aortic valve regurgitation is not visualized. No aortic stenosis is present. Pulmonic Valve: The pulmonic valve was not well visualized. Pulmonic valve regurgitation is not visualized. No evidence of pulmonic stenosis. Aorta: The aortic root is normal in size and structure. Pulmonary Artery: Indeterminant PASP, inadequate TR jet. IAS/Shunts: The interatrial septum was not well visualized.  LEFT VENTRICLE PLAX 2D LVIDd:         5.20 cm      Diastology LVIDs:         3.70 cm      LV e' lateral:   8.81 cm/s LV PW:         1.10 cm      LV E/e' lateral: 8.3 LV IVS:        1.20 cm      LV e' medial:    3.70 cm/s LVOT diam:     2.00 cm      LV E/e' medial:  19.8 LV SV:         71 LV SV Index:   35 LVOT Area:     3.14 cm  LV Volumes (MOD) LV vol d, MOD A2C: 128.0 ml LV vol d, MOD A4C: 178.0 ml LV vol s, MOD A4C: 83.6 ml LV SV MOD A4C:  178.0 ml RIGHT VENTRICLE RV S prime:     12.00 cm/s TAPSE (M-mode): 2.0 cm LEFT ATRIUM             Index       RIGHT ATRIUM           Index LA diam:        3.70 cm 1.85 cm/m  RA Area:     17.10 cm LA Vol (A2C):   68.0 ml 34.03 ml/m RA Volume:   47.70 ml  23.87 ml/m LA Vol (A4C):   55.6 ml 27.82 ml/m LA Biplane Vol: 66.3 ml 33.18 ml/m  AORTIC VALVE LVOT Vmax:   98.60 cm/s LVOT Vmean:  68.200 cm/s LVOT VTI:    0.225 m  AORTA Ao Root diam: 3.40 cm MITRAL VALVE MV Area (PHT): 4.15 cm    SHUNTS MV Decel Time: 183 msec    Systemic VTI:  0.22 m MV E velocity: 73.30 cm/s  Systemic Diam: 2.00 cm MV A  velocity: 84.00 cm/s MV E/A ratio:  0.87 Dina Rich MD Electronically signed by Dina Rich MD Signature Date/Time: 04/06/2019/2:45:43 PM    Final     Cardiac Studies   CORONARY STENT INTERVENTION 04/07/19  LEFT HEART CATH AND CORONARY ANGIOGRAPHY  Conclusion    Prox RCA lesion is 35% stenosed.  RPDA-1 lesion is 70% stenosed.  RPDA-2 lesion is 50% stenosed.  Mid LAD to Dist LAD lesion is 100% stenosed.  Prox LAD lesion is 20% stenosed.  Mid LAD lesion is 20% stenosed.  Previously placed Ramus stent (unknown type) is widely patent.  2nd RPL lesion is 50% stenosed.  Mid RCA lesion is 80% stenosed.  Post intervention, there is a 0% residual stenosis.  A stent was successfully placed.   Multivessel CAD with previously noted old total occlusion of the mid LAD with mild proximal to mid narrowing of 20 to 30% and evidence for faint bridging collaterals from septal perforating; widely patent stent to the proximal ramus intermediate vessel; normal left circumflex coronary artery; large dominant RCA with mild to moderate diffuse irregularity with narrowing of 30% stenoses proximally, diffuse 80% eccentric stenosis in the midportion of the vessel between the RV marginal branch and acute margin, 70% focal ostial PDA stenosis followed by 50% mid stenosis, 50% stenosis in inferior LV branch.  Low normal LV function with with EF estimated 50% with mid inferior hypocontractility and suggestion of possible distal apical hypocontractility.  LVEDP 9 mm.  Successful PCI to the dominant RCA with ultimate insertion of a 3.5 x 22 mm Resolute Onyx stent postdilated to 3.6 mm with the stenosis being reduced to 0% and brisk TIMI-3 flow.  Following successful reperfusion there was improved collateralization to the distal LAD via the distal RCA branches.  There was a very small caliber marginal branch that arose from the mid RCA in the region of the stented segment which had 95% stenosis prior to  intervention and was unchanged with TIMI-3 flow following stenting.  RECOMMENDATION: DAPT for minimum of a year.  Medical therapy for concomitant CAD.  Aggressive lipid-lowering therapy with target LDL less than 70%.  Continue IV nitroglycerin today with plans to wean and DC by a.m.     Diagnostic Dominance: Right     Intervention     Patient Profile     69 y.o. male with known CAD s/p PCI x 3 (2010), T2DM, HTN, and HLD who presents with chest pain found to have elevated hsTn concerning for NSTEMI.  Assessment & Plan      1. NSTEMI -Troponin peaked at 1079 on 3/27.   Treated with IV heparin and nitro gtt. cath showed multivessel CAD as noted above with old total occlusion of LAD.  He had 80% mid RCA stenosis s/p PCI of with 3.5 x 22 mm Resolute Onyx stent. Following successful reperfusion there was improved collateralization to the distal LAD via the distal RCA branches.  Medical therapy for other disease.  Recommended dual antiplatelet therapy with aspirin and Brilinta for minimum of year.  -Continue aspirin, statin, beta-blocker and ACE. -No recurrent chest pain.  Wean off IV nitroglycerin.  Ambulate and if does well >> likely home later today.  He will enroll in AGES studies.  2. HLD -04/07/2019: Cholesterol 122; HDL 30; LDL Cholesterol 65; Triglycerides 134; VLDL 27 - Continue Lipitor 40mg  qd  3. DM - HgbA1c 8.8. per primary  4. HTN -Elevated, Increase losartan to home dose 100 mg daily -Continue metoprolol 12.5 mg twice daily (uptitrate as blood pressure allows)     For questions or updates, please contact [place] Please consult [email or web address] for contact info under    SignedLeanor Kail, PA  04/08/2019, 8:57 AM     Patient seen and examined. Agree with assessment and plan. No  recurrent chest pain.  I reviewed the patient's cardiac catheterization and interventional study which I performed yesterday with he and his wife in detail.  Following  successful stenting to his diffuse 80% RCA stenosis, he had significantly improved collateralization to his LAD territory.  With significant concomitant CAD will need to optimize medical management.  Will increase metoprolol tartrate to 25 mg twice a day and probably add amlodipine 5 mg daily if blood pressure remains elevated at initial office visit.  Blood pressure presently 158/90.  Will titrate atorvastatin to 80 mg daily for more aggressive lipid management with goal LDL 50s or below particularly with his concomitant CAD.  Okay from cardiac perspective to discharge today.  Patient request that I resume following his cardiology care since he has not seen a cardiologist in several years at the [facility] hospital.   Troy Sine, MD, [facility] 04/08/2019 10:15 AM

## 2019-04-09 NOTE — Telephone Encounter (Signed)
Patient contacted regarding discharge from [facility] on 04/08/19.  Patient understands to follow up with provider Azalee Course, PA on 04/22/19 at 11:15 at [facility]. Patient understands discharge instructions? Yes Patient understands medications and regiment? Yes Patient understands to bring all medications to this visit? Yes

## 2019-04-22 ENCOUNTER — Encounter: Payer: Self-pay | Admitting: Physician Assistant

## 2019-04-22 ENCOUNTER — Other Ambulatory Visit: Payer: Self-pay

## 2019-04-22 ENCOUNTER — Ambulatory Visit: Payer: Medicare PPO | Admitting: Physician Assistant

## 2019-04-22 VITALS — BP 138/80 | HR 62 | Temp 98.2°F | Ht 67.0 in | Wt 193.0 lb

## 2019-04-22 DIAGNOSIS — E119 Type 2 diabetes mellitus without complications: Secondary | ICD-10-CM

## 2019-04-22 DIAGNOSIS — E785 Hyperlipidemia, unspecified: Secondary | ICD-10-CM

## 2019-04-22 DIAGNOSIS — I2510 Atherosclerotic heart disease of native coronary artery without angina pectoris: Secondary | ICD-10-CM

## 2019-04-22 DIAGNOSIS — I10 Essential (primary) hypertension: Secondary | ICD-10-CM | POA: Diagnosis not present

## 2019-04-22 MED ORDER — TICAGRELOR 90 MG PO TABS: 90.0000 mg | ORAL_TABLET | Freq: Two times a day (BID) | ORAL | 3 refills | Status: DC

## 2019-04-22 MED ORDER — AMLODIPINE BESYLATE 2.5 MG PO TABS: 2.5000 mg | ORAL_TABLET | Freq: Every day | ORAL | 3 refills | Status: DC

## 2019-04-22 MED ORDER — AMLODIPINE BESYLATE 2.5 MG PO TABS: 2.5000 mg | ORAL_TABLET | Freq: Every day | ORAL | 1 refills | Status: DC

## 2019-04-22 NOTE — Patient Instructions (Signed)
Medication Instructions:   START Amlodipine 2.5 mg daily  *If you need a refill on your cardiac medications before your next appointment, please call your pharmacy*  Lab Work: Your physician recommends that you return for lab work in 74months:   FASTING LIPID PANEL-DO NOT EAT OR DRINK PAST MIDNIGHT. OKAY TO HAVE WATER  HEPATIC (LIVER) FUNCTION TEST If you have labs (blood work) drawn today and your tests are completely normal, you will receive your results only by: Marland Kitchen MyChart Message (if you have MyChart) OR . A paper copy in the mail If you have any lab test that is abnormal or we need to change your treatment, we will call you to review the results.  Testing/Procedures: NONE ordered at this time of appointment   Follow-Up: At [facility], you and your health needs are our priority.  As part of our continuing mission to provide you with exceptional heart care, we have created designated Provider Care Teams.  These Care Teams include your primary Cardiologist (physician) and Advanced Practice Providers (APPs -  Physician Assistants and Nurse Practitioners) who all work together to provide you with the care you need, when you need it.  We recommend signing up for the patient portal called "MyChart".  Sign up information is provided on this After Visit Summary.  MyChart is used to connect with patients for Virtual Visits (Telemedicine).  Patients are able to view lab/test results, encounter notes, upcoming appointments, etc.  Non-urgent messages can be sent to your provider as well.   To learn more about what you can do with MyChart, go to [email or web address].    Your next appointment:   3 month(s)  The format for your next appointment:   In Person  Provider:   Nicki Guadalajara, MD  Other Instructions

## 2019-04-22 NOTE — Progress Notes (Signed)
Cardiology Office Note:    Date:  04/24/2019   ID:  Gregory Cole, DOB 11-Apr-1950, MRN [id number]  PCP:  Gwenlyn Fudge, FNP  Cardiologist:  Nicki Guadalajara, MD  Electrophysiologist:  None   Referring MD: Gwenlyn Fudge, FNP   Chief Complaint  Patient presents with  . Follow-up    seen for Dr. Tresa Endo    History of Present Illness:    Gregory Cole is a 69 y.o. male with a hx of CAD, HTN, HLD and DM II. He had PCI x3 in 2010.  Patient was recently admitted on 04/05/2019 with substernal chest pain.  He went to [facility], ED however with the elevation of the troponin, he was transferred to [facility] for possible cardiac catheterization.  High-sensitivity troponin peaked at 1079.  Cardiac catheterization performed on 04/06/2019 showed EF 50%, akinetic and aneurysmal LV apex with no LV thrombus.  Cardiac catheterization performed on 04/07/2019 showed multivessel disease with 35% proximal RCA, 70% followed by 50% RPDA lesion, 100% mid to distal LAD occlusion, 20% proximal and mid LAD, 80% mid RCA lesion treated with DES, widely patent ramus stent.  Since the distal LAD was being fed by faint bridging collaterals from the RCA, stenting of the mid RCA lesion resulted in improved collateralization to the distal LAD.  Patient presents today for cardiology office visit.  Brilinta currently cost more than $300 at local pharmacy, we have printed off prescription for Brilinta for the patient to take back to the [facility] to see if they will cover him.  If they are unable to cover him, I likely will switch him to Effient as Effient only cost roughly $45 per month for the patient.  He is due for fasting lipid panel and LFT in 2 months.  I decided to add amlodipine 2.5 mg daily to his medical regimen for antianginal and vasodilatory effect.  Overall he is doing well since recent cardiac catheterization and has not had any further chest pain.  He can follow-up in 3 months.   Past Medical  History:  Diagnosis Date  . Coronary artery disease   . Diabetes mellitus without complication (HCC)   . GERD (gastroesophageal reflux disease)   . Glaucoma   . Hypercholesteremia   . Hypertension   . Lesion of lung    [facility]  . Myocardial infarction (HCC)    x3  . Sleep apnea    unable to tolerate CPAP    Past Surgical History:  Procedure Laterality Date  . BOWEL RESECTION    . CARDIAC CATHETERIZATION     2 stents in heart  . CATARACT EXTRACTION W/PHACO Left 07/01/2013   Procedure: CATARACT EXTRACTION PHACO AND INTRAOCULAR LENS PLACEMENT (IOC);  Surgeon: Loraine Leriche T. Nile Riggs, MD;  Location: [facility] ORS;  Service: Ophthalmology;  Laterality: Left;  CDE 6.84  . CORONARY STENT INTERVENTION N/A 04/07/2019   Procedure: CORONARY STENT INTERVENTION;  Surgeon: Lennette Bihari, MD;  Location: [facility] INVASIVE CV LAB;  Service: Cardiovascular;  Laterality: N/A;  . HAND SURGERY Left   . LEFT HEART CATH AND CORONARY ANGIOGRAPHY N/A 04/07/2019   Procedure: LEFT HEART CATH AND CORONARY ANGIOGRAPHY;  Surgeon: Lennette Bihari, MD;  Location: [facility] INVASIVE CV LAB;  Service: Cardiovascular;  Laterality: N/A;  . NASAL SEPTUM SURGERY    . SHOULDER ARTHROSCOPY Right     Current Medications: Current Meds  Medication Sig  . albuterol (PROVENTIL HFA) 108 (90 BASE) MCG/ACT inhaler Inhale  2 puffs into the lungs every 6 (six) hours as needed for wheezing or shortness of breath.  Marland Kitchen aspirin EC 81 MG tablet Take 81 mg by mouth daily. Evening 1600  . atorvastatin (LIPITOR) 80 MG tablet Take 1 tablet (80 mg total) by mouth daily at 6 PM.  . insulin aspart protamine - aspart (NOVOLOG MIX 70/30 FLEXPEN) (70-30) 100 UNIT/ML FlexPen Inject 36 Units into the skin daily. Before dinner.  . isosorbide mononitrate (IMDUR) 120 MG 24 hr tablet Take 120 mg by mouth daily.  Marland Kitchen levothyroxine (SYNTHROID, LEVOTHROID) 88 MCG tablet Take 88 mcg by mouth daily before breakfast.  . losartan (COZAAR) 100 MG tablet Take  100 mg by mouth daily. Evening 1600  . metFORMIN (GLUCOPHAGE) 500 MG tablet Take 500 mg by mouth 2 (two) times daily.  . metoprolol tartrate (LOPRESSOR) 25 MG tablet Take 1 tablet (25 mg total) by mouth 2 (two) times daily.  . nitroGLYCERIN (NITROSTAT) 0.4 MG SL tablet Place under the tongue.  . Omega-3 Fatty Acids (FISH OIL PO) Take 1,000 mg by mouth 2 (two) times daily.  Marland Kitchen omeprazole (PRILOSEC) 20 MG capsule Take 20 mg by mouth daily. Evening 1600  . pioglitazone (ACTOS) 45 MG tablet Take 45 mg by mouth daily. Evening 1600  . ticagrelor (BRILINTA) 90 MG TABS tablet Take 1 tablet (90 mg total) by mouth 2 (two) times daily.  . [DISCONTINUED] ticagrelor (BRILINTA) 90 MG TABS tablet Take 1 tablet (90 mg total) by mouth 2 (two) times daily.     Allergies:   Patient has no known allergies.   Social History   Socioeconomic History  . Marital status: Married    Spouse name: Not on file  . Number of children: Not on file  . Years of education: Not on file  . Highest education level: Not on file  Occupational History  . Not on file  Tobacco Use  . Smoking status: Former Smoker    Packs/day: 2.00    Years: 40.00    Pack years: 80.00    Types: Cigarettes  . Smokeless tobacco: Never Used  Substance and Sexual Activity  . Alcohol use: Yes    Comment: occasional glass of wine  . Drug use: No  . Sexual activity: Not Currently  Other Topics Concern  . Not on file  Social History Narrative  . Not on file   Social Determinants of Health   Financial Resource Strain:   . Difficulty of Paying Living Expenses:   Food Insecurity:   . Worried About Programme researcher, broadcasting/film/video in the Last Year:   . Barista in the Last Year:   Transportation Needs:   . Freight forwarder (Medical):   Marland Kitchen Lack of Transportation (Non-Medical):   Physical Activity:   . Days of Exercise per Week:   . Minutes of Exercise per Session:   Stress:   . Feeling of Stress :   Social Connections:   . Frequency of  Communication with Friends and Family:   . Frequency of Social Gatherings with Friends and Family:   . Attends Religious Services:   . Active Member of Clubs or Organizations:   . Attends Banker Meetings:   Marland Kitchen Marital Status:      Family History: The patient's family history includes Heart disease in his father; Stroke in his mother.  ROS:   Please see the history of present illness.     All other systems reviewed and are negative.  EKGs/Labs/Other Studies Reviewed:    The following studies were reviewed today:  Echo 04/06/2019 1. 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 Echo contrast used, there is  no thrombus present within the apical aneurysm. Marland Kitchen Left ventricular  ejection fraction, by estimation, is 50%. The left ventricle has low  normal function. The left ventricle  demonstrates regional wall motion abnormalities (see scoring  diagram/findings for description). There is mild left ventricular  hypertrophy. Left ventricular diastolic parameters are consistent with  Grade I diastolic dysfunction (impaired relaxation).  Elevated left atrial pressure.  2. Right ventricular systolic function is normal. The right ventricular  size is normal.  3. Left atrial size was mildly dilated.  4. The mitral valve is normal in structure. No evidence of mitral valve  regurgitation. No evidence of mitral stenosis.  5. The aortic valve is tricuspid. Aortic valve regurgitation is not  visualized. No aortic stenosis is present.   Cath 04/07/2019  Prox RCA lesion is 35% stenosed.  RPDA-1 lesion is 70% stenosed.  RPDA-2 lesion is 50% stenosed.  Mid LAD to Dist LAD lesion is 100% stenosed.  Prox LAD lesion is 20% stenosed.  Mid LAD lesion is 20% stenosed.  Previously placed Ramus stent (unknown type) is widely patent.  2nd RPL lesion is 50% stenosed.  Mid RCA lesion is 80% stenosed.  Post intervention, there is a 0% residual stenosis.  A stent was successfully  placed.   Multivessel CAD with previously noted old total occlusion of the mid LAD with mild proximal to mid narrowing of 20 to 30% and evidence for faint bridging collaterals from septal perforating; widely patent stent to the proximal ramus intermediate vessel; normal left circumflex coronary artery; large dominant RCA with mild to moderate diffuse irregularity with narrowing of 30% stenoses proximally, diffuse 80% eccentric stenosis in the midportion of the vessel between the RV marginal branch and acute margin, 70% focal ostial PDA stenosis followed by 50% mid stenosis, 50% stenosis in inferior LV branch.  Low normal LV function with with EF estimated 50% with mid inferior hypocontractility and suggestion of possible distal apical hypocontractility.  LVEDP 9 mm.  Successful PCI to the dominant RCA with ultimate insertion of a 3.5 x 22 mm Resolute Onyx stent postdilated to 3.6 mm with the stenosis being reduced to 0% and brisk TIMI-3 flow.  Following successful reperfusion there was improved collateralization to the distal LAD via the distal RCA branches.  There was a ery small caliber marginal branch that arose from the mid RCA in the region of the stented segment which had 95% stenosis prior to intervention and was unchanged with TIMI-3 flow following stenting.  RECOMMENDATION: DAPT for minimum of a year.  Medical therapy for concomitant CAD.  Aggressive lipid-lowering therapy with target LDL less than 70%.  Continue IV nitroglycerin today with plans to wean and DC by a.m.  EKG:  EKG is ordered today.  The ekg ordered today demonstrates NSR, poor R wave progression in anterior leads  Recent Labs: 04/05/2019: B Natriuretic Peptide 55.0; TSH 0.335 04/06/2019: ALT 26 04/08/2019: BUN 14; Creatinine, Ser 1.19; Hemoglobin 11.1; Platelets 231; Potassium 3.8; Sodium 141  Recent Lipid Panel    Component Value Date/Time   CHOL 122 04/07/2019 0937   TRIG 134 04/07/2019 0937   HDL 30 (L) 04/07/2019  0937   CHOLHDL 4.1 04/07/2019 0937   VLDL 27 04/07/2019 0937   LDLCALC 65 04/07/2019 0937    Physical Exam:    VS:  BP 138/80  Pulse 62   Temp 98.2 F (36.8 C)   Ht 5\' 7"  (1.702 m)   Wt 193 lb (87.5 kg)   SpO2 98%   BMI 30.23 kg/m     Wt Readings from Last 3 Encounters:  04/22/19 193 lb (87.5 kg)  04/08/19 193 lb 14.4 oz (88 kg)  11/20/18 195 lb 12.8 oz (88.8 kg)     GEN:  Well nourished, well developed in no acute distress HEENT: Normal NECK: No JVD; No carotid bruits LYMPHATICS: No lymphadenopathy CARDIAC: RRR, no murmurs, rubs, gallops RESPIRATORY:  Clear to auscultation without rales, wheezing or rhonchi  ABDOMEN: Soft, non-tender, non-distended MUSCULOSKELETAL:  No edema; No deformity  SKIN: Warm and dry NEUROLOGIC:  Alert and oriented x 3 PSYCHIATRIC:  Normal affect   ASSESSMENT:    1. Coronary artery disease involving native coronary artery of native heart without angina pectoris   2. Hyperlipidemia, unspecified hyperlipidemia type   3. Essential hypertension   4. Controlled type 2 diabetes mellitus without complication, without long-term current use of insulin (HCC)    PLAN:    In order of problems listed above:  1. CAD: Continue aspirin and Brilinta.  Brilinta currently cost more than $300 for the patient, we have printed out a paper prescription for Brilinta for the patient to [facility] to see if they will cover him.  If not, I will switch him to Effient or Plavix.  Effient cost roughly $45 for him per month  2. Hypertension: I added 2.5 mg daily of amlodipine to his medical regimen for antianginal purposes  3. Hyperlipidemia: Continue Lipitor.  Fasting lipid panel LFT in 6 to 8 weeks  4. DM2: Managed per primary care provider.   Medication Adjustments/Labs and Tests Ordered: Current medicines are reviewed at length with the patient today.  Concerns regarding medicines are outlined above.  Orders Placed This Encounter   Procedures  . Hepatic function panel  . Lipid panel  . EKG 12-Lead   Meds ordered this encounter  Medications  . ticagrelor (BRILINTA) 90 MG TABS tablet    Sig: Take 1 tablet (90 mg total) by mouth 2 (two) times daily.    Dispense:  180 tablet    Refill:  3  . DISCONTD: amLODipine (NORVASC) 2.5 MG tablet    Sig: Take 1 tablet (2.5 mg total) by mouth daily.    Dispense:  90 tablet    Refill:  3  . amLODipine (NORVASC) 2.5 MG tablet    Sig: Take 1 tablet (2.5 mg total) by mouth daily.    Dispense:  30 tablet    Refill:  1    Patient Instructions  Medication Instructions:   START Amlodipine 2.5 mg daily  *If you need a refill on your cardiac medications before your next appointment, please call your pharmacy*  Lab Work: Your physician recommends that you return for lab work in 2months:   FASTING LIPID PANEL-DO NOT EAT OR DRINK PAST MIDNIGHT. OKAY TO HAVE WATER  HEPATIC (LIVER) FUNCTION TEST If you have labs (blood work) drawn today and your tests are completely normal, you will receive your results only by: Marland Kitchen. MyChart Message (if you have MyChart) OR . A paper copy in the mail If you have any lab test that is abnormal or we need to change your treatment, we will call you to review the results.  Testing/Procedures: NONE ordered at this time of appointment   Follow-Up: At [facility], you and your  health needs are our priority.  As part of our continuing mission to provide you with exceptional heart care, we have created designated Provider Care Teams.  These Care Teams include your primary Cardiologist (physician) and Advanced Practice Providers (APPs -  Physician Assistants and Nurse Practitioners) who all work together to provide you with the care you need, when you need it.  We recommend signing up for the patient portal called "MyChart".  Sign up information is provided on this After Visit Summary.  MyChart is used to connect with patients for Virtual Visits  (Telemedicine).  Patients are able to view lab/test results, encounter notes, upcoming appointments, etc.  Non-urgent messages can be sent to your provider as well.   To learn more about what you can do with MyChart, go to [email or web address].    Your next appointment:   3 month(s)  The format for your next appointment:   In Person  Provider:   Shelva Majestic, MD  Other Instructions      Signed, Almyra Deforest, [place]  04/24/2019 11:26 PM    [place]

## 2019-04-24 ENCOUNTER — Encounter: Payer: Self-pay | Admitting: Physician Assistant

## 2019-06-14 ENCOUNTER — Other Ambulatory Visit: Payer: Self-pay | Admitting: Physician Assistant

## 2019-06-27 ENCOUNTER — Telehealth: Payer: Self-pay | Admitting: Cardiovascular Disease

## 2019-06-27 NOTE — Telephone Encounter (Signed)
Called and spoke with pt, notified that per [facility] note from his last ov in April, he was to have fasting labs drawn in 2 months. Notified he could come in anytime this month to have the labs drawn. Notified I would send the lab slips to him and reminded him that they are fasting labs. Pt verbalized understanding and had no other questions at this time.

## 2019-06-27 NOTE — Telephone Encounter (Signed)
New Message:   Pt wants to know when is he supposed to have lab work?

## 2019-07-01 ENCOUNTER — Telehealth: Payer: Self-pay | Admitting: Family Medicine

## 2019-07-07 ENCOUNTER — Other Ambulatory Visit: Payer: Self-pay

## 2019-07-07 DIAGNOSIS — E785 Hyperlipidemia, unspecified: Secondary | ICD-10-CM | POA: Diagnosis not present

## 2019-07-07 LAB — HEPATIC FUNCTION PANEL
ALT: 25 IU/L (ref 0–44)
AST: 23 IU/L (ref 0–40)
Albumin: 4.3 g/dL (ref 3.8–4.8)
Alkaline Phosphatase: 77 IU/L (ref 48–121)
Bilirubin Total: 0.5 mg/dL (ref 0.0–1.2)
Bilirubin, Direct: 0.15 mg/dL (ref 0.00–0.40)
Total Protein: 7.1 g/dL (ref 6.0–8.5)

## 2019-07-07 LAB — LIPID PANEL
Chol/HDL Ratio: 3.7 ratio (ref 0.0–5.0)
Cholesterol, Total: 126 mg/dL (ref 100–199)
HDL: 34 mg/dL — ABNORMAL LOW (ref >39)
LDL Chol Calc (NIH): 60 mg/dL (ref 0–99)
Triglycerides: 195 mg/dL — ABNORMAL HIGH (ref 0–149)
VLDL Cholesterol Cal: 32 mg/dL (ref 5–40)

## 2019-07-08 NOTE — Progress Notes (Signed)
Normal liver function, total cholesterol and bad cholesterol are very well controlled, triglyceride borderline high. Continue on current therapy.

## 2019-07-11 ENCOUNTER — Telehealth: Payer: Self-pay | Admitting: Cardiovascular Disease

## 2019-07-11 NOTE — Telephone Encounter (Signed)
LVM for patient to return call to get follow up visit scheduled from patients recall list. 

## 2019-08-21 ENCOUNTER — Other Ambulatory Visit: Payer: Self-pay

## 2019-08-21 ENCOUNTER — Encounter: Payer: Self-pay | Admitting: Family Medicine

## 2019-08-21 ENCOUNTER — Ambulatory Visit: Payer: Medicare PPO | Admitting: Family Medicine

## 2019-08-21 VITALS — BP 149/73 | HR 66 | Temp 97.6°F | Ht 67.0 in | Wt 190.2 lb

## 2019-08-21 DIAGNOSIS — M25562 Pain in left knee: Secondary | ICD-10-CM | POA: Diagnosis not present

## 2019-08-21 MED ORDER — PREDNISONE 10 MG (21) PO TBPK: ORAL_TABLET | ORAL | 0 refills | Status: DC

## 2019-08-21 NOTE — Patient Instructions (Signed)
Heating pad Muscle rubs Tylenol 650 three times a day as needed   Journal for Nurse Practitioners, 15(4), [phone]. Retrieved October 15, 2017 from http://clinicalkey.com/nursing">  Knee Exercises Ask your health care provider which exercises are safe for you. Do exercises exactly as told by your health care provider and adjust them as directed. It is normal to feel mild stretching, pulling, tightness, or discomfort as you do these exercises. Stop right away if you feel sudden pain or your pain gets worse. Do not begin these exercises until told by your health care provider. Stretching and range-of-motion exercises These exercises warm up your muscles and joints and improve the movement and flexibility of your knee. These exercises also help to relieve pain and swelling. Knee extension, prone 1. Lie on your abdomen (prone position) on a bed. 2. Place your left / right knee just beyond the edge of the surface so your knee is not on the bed. You can put a towel under your left / right thigh just above your kneecap for comfort. 3. Relax your leg muscles and allow gravity to straighten your knee (extension). You should feel a stretch behind your left / right knee. 4. Hold this position for __________ seconds. 5. Scoot up so your knee is supported between repetitions. Repeat __________ times. Complete this exercise __________ times a day. Knee flexion, active  1. Lie on your back with both legs straight. If this causes back discomfort, bend your left / right knee so your foot is flat on the floor. 2. Slowly slide your left / right heel back toward your buttocks. Stop when you feel a gentle stretch in the front of your knee or thigh (flexion). 3. Hold this position for __________ seconds. 4. Slowly slide your left / right heel back to the starting position. Repeat __________ times. Complete this exercise __________ times a day. Quadriceps stretch, prone  1. Lie on your abdomen on a firm surface,  such as a bed or padded floor. 2. Bend your left / right knee and hold your ankle. If you cannot reach your ankle or pant leg, loop a belt around your foot and grab the belt instead. 3. Gently pull your heel toward your buttocks. Your knee should not slide out to the side. You should feel a stretch in the front of your thigh and knee (quadriceps). 4. Hold this position for __________ seconds. Repeat __________ times. Complete this exercise __________ times a day. Hamstring, supine 1. Lie on your back (supine position). 2. Loop a belt or towel over the ball of your left / right foot. The ball of your foot is on the walking surface, right under your toes. 3. Straighten your left / right knee and slowly pull on the belt to raise your leg until you feel a gentle stretch behind your knee (hamstring). ? Do not let your knee bend while you do this. ? Keep your other leg flat on the floor. 4. Hold this position for __________ seconds. Repeat __________ times. Complete this exercise __________ times a day. Strengthening exercises These exercises build strength and endurance in your knee. Endurance is the ability to use your muscles for a long time, even after they get tired. Quadriceps, isometric This exercise stretches the muscles in front of your thigh (quadriceps) without moving your knee joint (isometric). 1. Lie on your back with your left / right leg extended and your other knee bent. Put a rolled towel or small pillow under your knee if told by your health  care provider. 2. Slowly tense the muscles in the front of your left / right thigh. You should see your kneecap slide up toward your hip or see increased dimpling just above the knee. This motion will push the back of the knee toward the floor. 3. For __________ seconds, hold the muscle as tight as you can without increasing your pain. 4. Relax the muscles slowly and completely. Repeat __________ times. Complete this exercise __________ times a  day. Straight leg raises This exercise stretches the muscles in front of your thigh (quadriceps) and the muscles that move your hips (hip flexors). 1. Lie on your back with your left / right leg extended and your other knee bent. 2. Tense the muscles in the front of your left / right thigh. You should see your kneecap slide up or see increased dimpling just above the knee. Your thigh may even shake a bit. 3. Keep these muscles tight as you raise your leg 4-6 inches (10-15 cm) off the floor. Do not let your knee bend. 4. Hold this position for __________ seconds. 5. Keep these muscles tense as you lower your leg. 6. Relax your muscles slowly and completely after each repetition. Repeat __________ times. Complete this exercise __________ times a day. Hamstring, isometric 1. Lie on your back on a firm surface. 2. Bend your left / right knee about __________ degrees. 3. Dig your left / right heel into the surface as if you are trying to pull it toward your buttocks. Tighten the muscles in the back of your thighs (hamstring) to "dig" as hard as you can without increasing any pain. 4. Hold this position for __________ seconds. 5. Release the tension gradually and allow your muscles to relax completely for __________ seconds after each repetition. Repeat __________ times. Complete this exercise __________ times a day. Hamstring curls If told by your health care provider, do this exercise while wearing ankle weights. Begin with __________ lb weights. Then increase the weight by 1 lb (0.5 kg) increments. Do not wear ankle weights that are more than __________ lb. 1. Lie on your abdomen with your legs straight. 2. Bend your left / right knee as far as you can without feeling pain. Keep your hips flat against the floor. 3. Hold this position for __________ seconds. 4. Slowly lower your leg to the starting position. Repeat __________ times. Complete this exercise __________ times a day. Squats This  exercise strengthens the muscles in front of your thigh and knee (quadriceps). 1. Stand in front of a table, with your feet and knees pointing straight ahead. You may rest your hands on the table for balance but not for support. 2. Slowly bend your knees and lower your hips like you are going to sit in a chair. ? Keep your weight over your heels, not over your toes. ? Keep your lower legs upright so they are parallel with the table legs. ? Do not let your hips go lower than your knees. ? Do not bend lower than told by your health care provider. ? If your knee pain increases, do not bend as low. 3. Hold the squat position for __________ seconds. 4. Slowly push with your legs to return to standing. Do not use your hands to pull yourself to standing. Repeat __________ times. Complete this exercise __________ times a day. Wall slides This exercise strengthens the muscles in front of your thigh and knee (quadriceps). 1. Lean your back against a smooth wall or door, and walk your feet  out 18-24 inches (46-61 cm) from it. 2. Place your feet hip-width apart. 3. Slowly slide down the wall or door until your knees bend __________ degrees. Keep your knees over your heels, not over your toes. Keep your knees in line with your hips. 4. Hold this position for __________ seconds. Repeat __________ times. Complete this exercise __________ times a day. Straight leg raises This exercise strengthens the muscles that rotate the leg at the hip and move it away from your body (hip abductors). 1. Lie on your side with your left / right leg in the top position. Lie so your head, shoulder, knee, and hip line up. You may bend your bottom knee to help you keep your balance. 2. Roll your hips slightly forward so your hips are stacked directly over each other and your left / right knee is facing forward. 3. Leading with your heel, lift your top leg 4-6 inches (10-15 cm). You should feel the muscles in your outer hip  lifting. ? Do not let your foot drift forward. ? Do not let your knee roll toward the ceiling. 4. Hold this position for __________ seconds. 5. Slowly return your leg to the starting position. 6. Let your muscles relax completely after each repetition. Repeat __________ times. Complete this exercise __________ times a day. Straight leg raises This exercise stretches the muscles that move your hips away from the front of the pelvis (hip extensors). 1. Lie on your abdomen on a firm surface. You can put a pillow under your hips if that is more comfortable. 2. Tense the muscles in your buttocks and lift your left / right leg about 4-6 inches (10-15 cm). Keep your knee straight as you lift your leg. 3. Hold this position for __________ seconds. 4. Slowly lower your leg to the starting position. 5. Let your leg relax completely after each repetition. Repeat __________ times. Complete this exercise __________ times a day. This information is not intended to replace advice given to you by your health care provider. Make sure you discuss any questions you have with your health care provider. Document Revised: 10/16/2017 Document Reviewed: 10/16/2017 Elsevier Patient Education  2020 [employer].

## 2019-08-21 NOTE — Progress Notes (Signed)
Assessment & Plan:  1. Acute pain of left knee - Knee exercises provided to complete at home. Encouraged use of heating pad, muscle rub, and Tylenol 650 mg TID PRN.  - predniSONE (STERAPRED UNI-PAK 21 TAB) 10 MG (21) TBPK tablet; As directed x 6 days  Dispense: 21 tablet; Refill: 0   Return if symptoms worsen or fail to improve.  [name], MSN, APRN, FNP-C [facility]  Subjective:    Patient ID: Gregory Cole, male    DOB: [birth year], 69 y.o.   MRN: [id number]  Patient Care Team: Gwenlyn Fudge, FNP as PCP - General (Family Medicine) Lennette Bihari, MD as PCP - Cardiology (Cardiology) Azalee Course, [place] as Physician Assistant (Cardiology)   Chief Complaint:  Chief Complaint  Patient presents with  . Knee Pain    behind left knee shots down leg, 3 mos, gotten worse    HPI: Gregory Cole is a 69 y.o. male presenting on 08/21/2019 for Knee Pain (behind left knee shots down leg, 3 mos, gotten worse)  Knee Pain: Patient presents with knee pain involving the left knee. Onset of the symptoms was several months ago. Inciting event: none known. Current symptoms include pain located posteriorly. Pain is aggravated by nothing imparticular.  Patient has had no prior knee problems. Evaluation to date: none. Treatment to date: brace which is ineffective and Tylenol 325 mg 3x/week.   Social history:  Relevant past medical, surgical, family and social history reviewed and updated as indicated. Interim medical history since our last visit reviewed.  Allergies and medications reviewed and updated.  DATA REVIEWED: CHART IN EPIC  ROS: Negative unless specifically indicated above in HPI.    Current Outpatient Medications:  .  albuterol (PROVENTIL HFA) 108 (90 BASE) MCG/ACT inhaler, Inhale 2 puffs into the lungs every 6 (six) hours as needed for wheezing or shortness of breath., Disp: , Rfl:  .  amLODipine (NORVASC) 2.5 MG tablet, TAKE 1 TABLET BY MOUTH EVERY  DAY, Disp: 90 tablet, Rfl: 0 .  aspirin EC 81 MG tablet, Take 81 mg by mouth daily. Evening 1600, Disp: , Rfl:  .  insulin aspart protamine - aspart (NOVOLOG MIX 70/30 FLEXPEN) (70-30) 100 UNIT/ML FlexPen, Inject 36 Units into the skin daily. Before dinner., Disp: , Rfl:  .  isosorbide mononitrate (IMDUR) 120 MG 24 hr tablet, Take 120 mg by mouth daily., Disp: , Rfl:  .  levothyroxine (SYNTHROID, LEVOTHROID) 88 MCG tablet, Take 88 mcg by mouth daily before breakfast., Disp: , Rfl:  .  losartan (COZAAR) 100 MG tablet, Take 100 mg by mouth daily. Evening 1600, Disp: , Rfl:  .  metFORMIN (GLUCOPHAGE) 500 MG tablet, Take 500 mg by mouth 2 (two) times daily., Disp: , Rfl:  .  nitroGLYCERIN (NITROSTAT) 0.4 MG SL tablet, Place under the tongue., Disp: , Rfl:  .  Omega-3 Fatty Acids (FISH OIL PO), Take 1,000 mg by mouth 2 (two) times daily., Disp: , Rfl:  .  omeprazole (PRILOSEC) 20 MG capsule, Take 20 mg by mouth daily. Evening 1600, Disp: , Rfl:  .  pioglitazone (ACTOS) 45 MG tablet, Take 45 mg by mouth daily. Evening 1600, Disp: , Rfl:  .  ticagrelor (BRILINTA) 90 MG TABS tablet, Take 1 tablet (90 mg total) by mouth 2 (two) times daily., Disp: 180 tablet, Rfl: 3 .  atorvastatin (LIPITOR) 80 MG tablet, Take 1 tablet (80 mg total) by mouth daily at 6 PM., Disp: 30 tablet, Rfl: 0 .  metoprolol tartrate (LOPRESSOR) 25 MG tablet, Take 1 tablet (25 mg total) by mouth 2 (two) times daily., Disp: 60 tablet, Rfl: 0   No Known Allergies Past Medical History:  Diagnosis Date  . Coronary artery disease   . Diabetes mellitus without complication (HCC)   . GERD (gastroesophageal reflux disease)   . Glaucoma   . Hypercholesteremia   . Hypertension   . Lesion of lung    [facility] - [facility]  . Myocardial infarction (HCC)    x3  . Sleep apnea    unable to tolerate CPAP    Past Surgical History:  Procedure Laterality Date  . BOWEL RESECTION    . CARDIAC CATHETERIZATION     2 stents in heart   . CATARACT EXTRACTION W/PHACO Left 07/01/2013   Procedure: CATARACT EXTRACTION PHACO AND INTRAOCULAR LENS PLACEMENT (IOC);  Surgeon: Loraine Leriche T. Nile Riggs, MD;  Location: [facility];  Service: Ophthalmology;  Laterality: Left;  CDE 6.84  . CORONARY STENT INTERVENTION N/A 04/07/2019   Procedure: CORONARY STENT INTERVENTION;  Surgeon: Lennette Bihari, MD;  Location: [facility] INVASIVE CV LAB;  Service: Cardiovascular;  Laterality: N/A;  . HAND SURGERY Left   . LEFT HEART CATH AND CORONARY ANGIOGRAPHY N/A 04/07/2019   Procedure: LEFT HEART CATH AND CORONARY ANGIOGRAPHY;  Surgeon: Lennette Bihari, MD;  Location: [facility] INVASIVE CV LAB;  Service: Cardiovascular;  Laterality: N/A;  . NASAL SEPTUM SURGERY    . SHOULDER ARTHROSCOPY Right     Social History   Socioeconomic History  . Marital status: Married    Spouse name: Not on file  . Number of children: Not on file  . Years of education: Not on file  . Highest education level: Not on file  Occupational History  . Not on file  Tobacco Use  . Smoking status: Former Smoker    Packs/day: 2.00    Years: 40.00    Pack years: 80.00    Types: Cigarettes  . Smokeless tobacco: Never Used  Vaping Use  . Vaping Use: Never used  Substance and Sexual Activity  . Alcohol use: Yes    Comment: occasional glass of wine  . Drug use: No  . Sexual activity: Not Currently  Other Topics Concern  . Not on file  Social History Narrative  . Not on file   Social Determinants of Health   Financial Resource Strain:   . Difficulty of Paying Living Expenses:   Food Insecurity:   . Worried About Programme researcher, broadcasting/film/video in the Last Year:   . Barista in the Last Year:   Transportation Needs:   . Freight forwarder (Medical):   Marland Kitchen Lack of Transportation (Non-Medical):   Physical Activity:   . Days of Exercise per Week:   . Minutes of Exercise per Session:   Stress:   . Feeling of Stress :   Social Connections:   . Frequency of Communication with Friends and Family:    . Frequency of Social Gatherings with Friends and Family:   . Attends Religious Services:   . Active Member of Clubs or Organizations:   . Attends Banker Meetings:   Marland Kitchen Marital Status:   Intimate Partner Violence:   . Fear of Current or Ex-Partner:   . Emotionally Abused:   Marland Kitchen Physically Abused:   . Sexually Abused:         Objective:    BP (!) 149/73 (BP Location: Right Arm, Patient Position: Sitting, Cuff  Size: Normal)   Pulse 66   Temp 97.6 F (36.4 C) (Temporal)   Ht 5\' 7"  (1.702 m)   Wt 190 lb 3.2 oz (86.3 kg)   BMI 29.79 kg/m   Wt Readings from Last 3 Encounters:  08/21/19 190 lb 3.2 oz (86.3 kg)  04/22/19 193 lb (87.5 kg)  04/08/19 193 lb 14.4 oz (88 kg)    Physical Exam Vitals reviewed.  Constitutional:      General: He is not in acute distress.    Appearance: Normal appearance. He is not ill-appearing, toxic-appearing or diaphoretic.  HENT:     Head: Normocephalic and atraumatic.  Eyes:     General: No scleral icterus.       Right eye: No discharge.        Left eye: No discharge.     Conjunctiva/sclera: Conjunctivae normal.  Cardiovascular:     Rate and Rhythm: Normal rate.  Pulmonary:     Effort: Pulmonary effort is normal. No respiratory distress.  Musculoskeletal:        General: Normal range of motion.     Cervical back: Normal range of motion.     Left knee: No swelling, deformity, effusion, erythema, ecchymosis, lacerations, bony tenderness or crepitus. Normal range of motion. Tenderness (posteriorly) present. No LCL laxity, MCL laxity, ACL laxity or PCL laxity.Normal alignment, normal meniscus and normal patellar mobility. Normal pulse.     Instability Tests: Anterior drawer test negative. Posterior drawer test negative. Anterior Lachman test negative. Medial McMurray test negative and lateral McMurray test negative.  Skin:    General: Skin is warm and dry.  Neurological:     Mental Status: He is alert and oriented to person, place,  and time. Mental status is at baseline.  Psychiatric:        Mood and Affect: Mood normal.        Behavior: Behavior normal.        Thought Content: Thought content normal.        Judgment: Judgment normal.     Lab Results  Component Value Date   TSH 0.335 (L) 04/05/2019   Lab Results  Component Value Date   WBC 6.3 04/08/2019   HGB 11.1 (L) 04/08/2019   HCT 36.4 (L) 04/08/2019   MCV 85.6 04/08/2019   PLT 231 04/08/2019   Lab Results  Component Value Date   NA 141 04/08/2019   K 3.8 04/08/2019   CO2 24 04/08/2019   GLUCOSE 135 (H) 04/08/2019   BUN 14 04/08/2019   CREATININE 1.19 04/08/2019   BILITOT 0.5 07/07/2019   ALKPHOS 77 07/07/2019   AST 23 07/07/2019   ALT 25 07/07/2019   PROT 7.1 07/07/2019   ALBUMIN 4.3 07/07/2019   CALCIUM 8.8 (L) 04/08/2019   ANIONGAP 9 04/08/2019   Lab Results  Component Value Date   CHOL 126 07/07/2019   Lab Results  Component Value Date   HDL 34 (L) 07/07/2019   Lab Results  Component Value Date   LDLCALC 60 07/07/2019   Lab Results  Component Value Date   TRIG 195 (H) 07/07/2019   Lab Results  Component Value Date   CHOLHDL 3.7 07/07/2019   Lab Results  Component Value Date   HGBA1C 8.8 (H) 04/05/2019

## 2019-12-15 ENCOUNTER — Ambulatory Visit: Payer: Medicare PPO | Admitting: Cardiovascular Disease

## 2019-12-15 DIAGNOSIS — J00 Acute nasopharyngitis [common cold]: Secondary | ICD-10-CM | POA: Diagnosis not present

## 2020-01-14 DIAGNOSIS — E119 Type 2 diabetes mellitus without complications: Secondary | ICD-10-CM | POA: Diagnosis not present

## 2020-01-14 DIAGNOSIS — E113291 Type 2 diabetes mellitus with mild nonproliferative diabetic retinopathy without macular edema, right eye: Secondary | ICD-10-CM | POA: Diagnosis not present

## 2020-03-09 ENCOUNTER — Telehealth: Payer: Self-pay

## 2020-03-09 NOTE — Telephone Encounter (Signed)
Please put in order for lab work for patient. He has an appt to see Britney on 03/23/20 for Diabetic Ck at [facility], and is coming to do his lab work the day before appt.

## 2020-03-10 NOTE — Telephone Encounter (Signed)
I would rather order while he is here so I know what all to order. Hard to say when he hasn't been seen for chronic issues in almost a year.

## 2020-03-10 NOTE — Telephone Encounter (Signed)
Left message to call back  

## 2020-03-10 NOTE — Telephone Encounter (Signed)
Patient aware.  We changed his appointment from 2:00 pm on 03/23/20 to 10:05 am on 03/18/20 so patient could come to appointment fasting.

## 2020-03-18 ENCOUNTER — Ambulatory Visit: Payer: Medicare PPO | Admitting: Family Medicine

## 2020-03-18 ENCOUNTER — Other Ambulatory Visit: Payer: Self-pay

## 2020-03-18 ENCOUNTER — Encounter: Payer: Self-pay | Admitting: Family Medicine

## 2020-03-18 VITALS — BP 137/83 | HR 80 | Temp 97.8°F | Ht 67.0 in | Wt 182.2 lb

## 2020-03-18 DIAGNOSIS — I10 Essential (primary) hypertension: Secondary | ICD-10-CM

## 2020-03-18 DIAGNOSIS — E890 Postprocedural hypothyroidism: Secondary | ICD-10-CM | POA: Diagnosis not present

## 2020-03-18 DIAGNOSIS — E1165 Type 2 diabetes mellitus with hyperglycemia: Secondary | ICD-10-CM | POA: Diagnosis not present

## 2020-03-18 DIAGNOSIS — Z23 Encounter for immunization: Secondary | ICD-10-CM

## 2020-03-18 DIAGNOSIS — M1990 Unspecified osteoarthritis, unspecified site: Secondary | ICD-10-CM | POA: Diagnosis not present

## 2020-03-18 DIAGNOSIS — K219 Gastro-esophageal reflux disease without esophagitis: Secondary | ICD-10-CM | POA: Diagnosis not present

## 2020-03-18 DIAGNOSIS — I25110 Atherosclerotic heart disease of native coronary artery with unstable angina pectoris: Secondary | ICD-10-CM

## 2020-03-18 HISTORY — DX: Postprocedural hypothyroidism: E89.0

## 2020-03-18 LAB — BAYER DCA HB A1C WAIVED: HB A1C (BAYER DCA - WAIVED): 7.1 % — ABNORMAL HIGH (ref <7.0)

## 2020-03-18 MED ORDER — DICLOFENAC SODIUM 1 % EX GEL: 2.0000 g | Freq: Four times a day (QID) | CUTANEOUS | 2 refills | Status: DC

## 2020-03-18 NOTE — Patient Instructions (Signed)
Diabetes Mellitus and Nutrition, Adult When you have diabetes, or diabetes mellitus, it is very important to have healthy eating habits because your blood sugar (glucose) levels are greatly affected by what you eat and drink. Eating healthy foods in the right amounts, at about the same times every day, can help you:  Control your blood glucose.  Lower your risk of heart disease.  Improve your blood pressure.  Reach or maintain a healthy weight. What can affect my meal plan? Every person with diabetes is different, and each person has different needs for a meal plan. Your health care provider may recommend that you work with a dietitian to make a meal plan that is best for you. Your meal plan may vary depending on factors such as:  The calories you need.  The medicines you take.  Your weight.  Your blood glucose, blood pressure, and cholesterol levels.  Your activity level.  Other health conditions you have, such as heart or kidney disease. How do carbohydrates affect me? Carbohydrates, also called carbs, affect your blood glucose level more than any other type of food. Eating carbs naturally raises the amount of glucose in your blood. Carb counting is a method for keeping track of how many carbs you eat. Counting carbs is important to keep your blood glucose at a healthy level, especially if you use insulin or take certain oral diabetes medicines. It is important to know how many carbs you can safely have in each meal. This is different for every person. Your dietitian can help you calculate how many carbs you should have at each meal and for each snack. How does alcohol affect me? Alcohol can cause a sudden decrease in blood glucose (hypoglycemia), especially if you use insulin or take certain oral diabetes medicines. Hypoglycemia can be a life-threatening condition. Symptoms of hypoglycemia, such as sleepiness, dizziness, and confusion, are similar to symptoms of having too much  alcohol.  Do not drink alcohol if: ? Your health care provider tells you not to drink. ? You are pregnant, may be pregnant, or are planning to become pregnant.  If you drink alcohol: ? Do not drink on an empty stomach. ? Limit how much you use to:  0-1 drink a day for women.  0-2 drinks a day for men. ? Be aware of how much alcohol is in your drink. In the [place] one drink equals one 12 oz bottle of beer (355 mL), one 5 oz glass of wine (148 mL), or one 1 oz glass of hard liquor (44 mL). ? Keep yourself hydrated with water, diet soda, or unsweetened iced tea.  Keep in mind that regular soda, juice, and other mixers may contain a lot of sugar and must be counted as carbs. What are tips for following this plan? Reading food labels  Start by checking the serving size on the "Nutrition Facts" label of packaged foods and drinks. The amount of calories, carbs, fats, and other nutrients listed on the label is based on one serving of the item. Many items contain more than one serving per package.  Check the total grams (g) of carbs in one serving. You can calculate the number of servings of carbs in one serving by dividing the total carbs by 15. For example, if a food has 30 g of total carbs per serving, it would be equal to 2 servings of carbs.  Check the number of grams (g) of saturated fats and trans fats in one serving. Choose foods that have   a low amount or none of these fats.  Check the number of milligrams (mg) of salt (sodium) in one serving. Most people should limit total sodium intake to less than 2,300 mg per day.  Always check the nutrition information of foods labeled as "low-fat" or "nonfat." These foods may be higher in added sugar or refined carbs and should be avoided.  Talk to your dietitian to identify your daily goals for nutrients listed on the label. Shopping  Avoid buying canned, pre-made, or processed foods. These foods tend to be high in fat, sodium, and added  sugar.  Shop around the outside edge of the grocery store. This is where you will most often find fresh fruits and vegetables, bulk grains, fresh meats, and fresh dairy. Cooking  Use low-heat cooking methods, such as baking, instead of high-heat cooking methods like deep frying.  Cook using healthy oils, such as olive, canola, or sunflower oil.  Avoid cooking with butter, cream, or high-fat meats. Meal planning  Eat meals and snacks regularly, preferably at the same times every day. Avoid going long periods of time without eating.  Eat foods that are high in fiber, such as fresh fruits, vegetables, beans, and whole grains. Talk with your dietitian about how many servings of carbs you can eat at each meal.  Eat 4-6 oz (112-168 g) of lean protein each day, such as lean meat, chicken, fish, eggs, or tofu. One ounce (oz) of lean protein is equal to: ? 1 oz (28 g) of meat, chicken, or fish. ? 1 egg. ?  cup (62 g) of tofu.  Eat some foods each day that contain healthy fats, such as avocado, nuts, seeds, and fish.   What foods should I eat? Fruits Berries. Apples. Oranges. Peaches. Apricots. Plums. Grapes. Mango. Papaya. Pomegranate. Kiwi. Cherries. Vegetables Lettuce. Spinach. Leafy greens, including kale, chard, collard greens, and mustard greens. Beets. Cauliflower. Cabbage. Broccoli. Carrots. Green beans. Tomatoes. Peppers. Onions. Cucumbers. Brussels sprouts. Grains Whole grains, such as whole-wheat or whole-grain bread, crackers, tortillas, cereal, and pasta. Unsweetened oatmeal. Quinoa. Brown or wild rice. Meats and other proteins Seafood. Poultry without skin. Lean cuts of poultry and beef. Tofu. Nuts. Seeds. Dairy Low-fat or fat-free dairy products such as milk, yogurt, and cheese. The items listed above may not be a complete list of foods and beverages you can eat. Contact a dietitian for more information. What foods should I avoid? Fruits Fruits canned with  syrup. Vegetables Canned vegetables. Frozen vegetables with butter or cream sauce. Grains Refined white flour and flour products such as bread, pasta, snack foods, and cereals. Avoid all processed foods. Meats and other proteins Fatty cuts of meat. Poultry with skin. Breaded or fried meats. Processed meat. Avoid saturated fats. Dairy Full-fat yogurt, cheese, or milk. Beverages Sweetened drinks, such as soda or iced tea. The items listed above may not be a complete list of foods and beverages you should avoid. Contact a dietitian for more information. Questions to ask a health care provider  Do I need to meet with a diabetes educator?  Do I need to meet with a dietitian?  What number can I call if I have questions?  When are the best times to check my blood glucose? Where to find more information:  American Diabetes Association: diabetes.org  Academy of Nutrition and Dietetics: www.eatright.org  National Institute of Diabetes and Digestive and Kidney Diseases: www.niddk.nih.gov  Association of Diabetes Care and Education Specialists: www.diabeteseducator.org Summary  It is important to have healthy eating   habits because your blood sugar (glucose) levels are greatly affected by what you eat and drink.  A healthy meal plan will help you control your blood glucose and maintain a healthy lifestyle.  Your health care provider may recommend that you work with a dietitian to make a meal plan that is best for you.  Keep in mind that carbohydrates (carbs) and alcohol have immediate effects on your blood glucose levels. It is important to count carbs and to use alcohol carefully. This information is not intended to replace advice given to you by your health care provider. Make sure you discuss any questions you have with your health care provider. Document Revised: 12/03/2018 Document Reviewed: 12/03/2018 Elsevier Patient Education  2021 [employer]  

## 2020-03-18 NOTE — Progress Notes (Signed)
Assessment & Plan:  1. Type 2 diabetes mellitus with hyperglycemia, without long-term current use of insulin (HCC) Lab Results  Component Value Date   HGBA1C 7.1 (H) 03/18/2020   HGBA1C 8.8 (H) 04/05/2019    Managed by the [facility].  No changes were made in his medication regimen since he is just barely over 5 and over 70 years of age. - Medications: continue current medications - Home glucose monitoring: Continue monitoring - Patient is currently taking a statin. Patient is taking an ACE-inhibitor/ARB.  - Instruction/counseling given: discussed foot care  - Patient recently had retinal images obtained by someone that came out to his house through [facility].  Diabetes Health Maintenance Due  Topic Date Due  . OPHTHALMOLOGY EXAM  05/09/2019  . HEMOGLOBIN A1C  09/18/2020  . FOOT EXAM  03/18/2021    - Lipid panel - CBC with Differential/Platelet - CMP14+EGFR - Bayer DCA Hb A1c Waived  2. Essential hypertension Well controlled on current regimen.  - Lipid panel - CBC with Differential/Platelet - CMP14+EGFR - TSH  3. Coronary artery disease involving native coronary artery of native heart with unstable angina pectoris ([place]) On a statin. - CMP14+EGFR  4. Postablative hypothyroidism Labs to assess. - CMP14+EGFR - TSH  5. Arthritis Patient is somewhat interested in steroid injections into his knees but is going to try Voltaren gel first. - diclofenac Sodium (VOLTAREN) 1 % GEL; Apply 2 g topically 4 (four) times daily.  Dispense: 350 g; Refill: 2  6. Gastroesophageal reflux disease, unspecified whether esophagitis present Well controlled on current regimen.  - CMP14+EGFR  7. Immunization due - Pneumococcal conjugate vaccine 13-valent - given in office today.   Return in about 6 months (around 09/18/2020) for follow-up of chronic medication conditions.  Hendricks Limes, MSN, APRN, FNP-C [facility]  Subjective:    Patient ID: Gregory Cole, male     DOB: 03-11-1950, 70 y.o.   MRN: [id number]  Patient Care Team: Loman Brooklyn, FNP as PCP - General (Family Medicine) Troy Sine, MD as PCP - Cardiology (Cardiology) Almyra Deforest, [place] as Physician Assistant (Cardiology)   Chief Complaint:  Chief Complaint  Patient presents with  . Diabetes  . Hypertension    Check up of chronic medical conditions   . Knee Pain    Patient states he been having right knee pain that has been ongoing.     HPI: Gregory Cole is a 70 y.o. male presenting on 03/18/2020 for Diabetes, Hypertension (Check up of chronic medical conditions ), and Knee Pain (Patient states he been having right knee pain that has been ongoing./)  Patient's medical conditions are managed by the [place] in [place].  He reports it has been over 3 months since he had his last lab work.  Diabetes: Patient presents for follow up of diabetes. Current symptoms include: none. Known diabetic complications: none. Medication compliance: Yes. Current diet: in general, a "healthy" diet  . Current exercise: walking. Home blood sugar records: reports fasting 120 and lower. Is he  on ACE inhibitor or angiotensin II receptor blocker? Yes. Is he on a statin? Yes.   Hypothyroidism: Patient reports he used to be overactive and has been on medication since they did an ablation.  New complaints: Patient complains of bilateral knee pain.  [facility] reports [facility] and was told he has arthritis and separation of the cartilage.  He has only tried icy hot which is somewhat effective.  Social history:  Relevant past medical, surgical, family and social history reviewed and updated as indicated. Interim medical history since our last visit reviewed.  Allergies and medications reviewed and updated.  DATA REVIEWED: CHART IN EPIC  ROS: Negative unless specifically indicated above in HPI.    Current Outpatient Medications:  .  albuterol (VENTOLIN HFA) 108 (90 Base) MCG/ACT inhaler,  Inhale 2 puffs into the lungs every 6 (six) hours as needed for wheezing or shortness of breath., Disp: , Rfl:  .  amLODipine (NORVASC) 2.5 MG tablet, TAKE 1 TABLET BY MOUTH EVERY DAY, Disp: 90 tablet, Rfl: 0 .  aspirin EC 81 MG tablet, Take 81 mg by mouth daily. Evening 1600, Disp: , Rfl:  .  insulin aspart protamine - aspart (NOVOLOG MIX 70/30 FLEXPEN) (70-30) 100 UNIT/ML FlexPen, Inject 36 Units into the skin daily. Before dinner., Disp: , Rfl:  .  isosorbide mononitrate (IMDUR) 120 MG 24 hr tablet, Take 120 mg by mouth daily., Disp: , Rfl:  .  levothyroxine (SYNTHROID, LEVOTHROID) 88 MCG tablet, Take 88 mcg by mouth daily before breakfast., Disp: , Rfl:  .  losartan (COZAAR) 100 MG tablet, Take 100 mg by mouth daily. Evening 1600, Disp: , Rfl:  .  metFORMIN (GLUCOPHAGE) 500 MG tablet, Take 500 mg by mouth 2 (two) times daily., Disp: , Rfl:  .  nitroGLYCERIN (NITROSTAT) 0.4 MG SL tablet, Place under the tongue., Disp: , Rfl:  .  Omega-3 Fatty Acids (FISH OIL PO), Take 1,000 mg by mouth 2 (two) times daily., Disp: , Rfl:  .  omeprazole (PRILOSEC) 20 MG capsule, Take 20 mg by mouth daily. Evening 1600, Disp: , Rfl:  .  pioglitazone (ACTOS) 45 MG tablet, Take 45 mg by mouth daily. Evening 1600, Disp: , Rfl:  .  ticagrelor (BRILINTA) 90 MG TABS tablet, Take 1 tablet (90 mg total) by mouth 2 (two) times daily., Disp: 180 tablet, Rfl: 3 .  atorvastatin (LIPITOR) 80 MG tablet, Take 1 tablet (80 mg total) by mouth daily at 6 PM., Disp: 30 tablet, Rfl: 0 .  metoprolol tartrate (LOPRESSOR) 25 MG tablet, Take 1 tablet (25 mg total) by mouth 2 (two) times daily., Disp: 60 tablet, Rfl: 0   No Known Allergies Past Medical History:  Diagnosis Date  . Coronary artery disease   . Diabetes mellitus without complication ([place])   . GERD (gastroesophageal reflux disease)   . Glaucoma   . Hypercholesteremia   . Hypertension   . Lesion of lung    patient reported - [facility]  . Myocardial infarction ([place])     x3  . Sleep apnea    unable to tolerate CPAP    Past Surgical History:  Procedure Laterality Date  . BOWEL RESECTION    . CARDIAC CATHETERIZATION     2 stents in heart  . CATARACT EXTRACTION W/PHACO Left 07/01/2013   Procedure: CATARACT EXTRACTION PHACO AND INTRAOCULAR LENS PLACEMENT (IOC);  Surgeon: Elta Guadeloupe T. Gershon Crane, MD;  Location: [facility];  Service: Ophthalmology;  Laterality: Left;  CDE 6.84  . CORONARY STENT INTERVENTION N/A 04/07/2019   Procedure: CORONARY STENT INTERVENTION;  Surgeon: Troy Sine, MD;  Location: [facility];  Service: Cardiovascular;  Laterality: N/A;  . HAND SURGERY Left   . LEFT HEART CATH AND CORONARY ANGIOGRAPHY N/A 04/07/2019   Procedure: LEFT HEART CATH AND CORONARY ANGIOGRAPHY;  Surgeon: Troy Sine, MD;  Location: [facility];  Service: Cardiovascular;  Laterality: N/A;  .  NASAL SEPTUM SURGERY    . SHOULDER ARTHROSCOPY Right     Social History   Socioeconomic History  . Marital status: Married    Spouse name: Not on file  . Number of children: Not on file  . Years of education: Not on file  . Highest education level: Not on file  Occupational History  . Not on file  Tobacco Use  . Smoking status: Former Smoker    Packs/day: 2.00    Years: 40.00    Pack years: 80.00    Types: Cigarettes  . Smokeless tobacco: Never Used  Vaping Use  . Vaping Use: Never used  Substance and Sexual Activity  . Alcohol use: Yes    Comment: occasional glass of wine  . Drug use: No  . Sexual activity: Not Currently  Other Topics Concern  . Not on file  Social History Narrative  . Not on file   Social Determinants of Health   Financial Resource Strain: Not on file  Food Insecurity: Not on file  Transportation Needs: Not on file  Physical Activity: Not on file  Stress: Not on file  Social Connections: Not on file  Intimate Partner Violence: Not on file        Objective:    BP 137/83   Pulse 80   Temp 97.8 F (36.6 C) (Temporal)    Ht '5\' 7"'  (1.702 m)   Wt 182 lb 3.2 oz (82.6 kg)   SpO2 100%   BMI 28.54 kg/m   Wt Readings from Last 3 Encounters:  03/18/20 182 lb 3.2 oz (82.6 kg)  08/21/19 190 lb 3.2 oz (86.3 kg)  04/22/19 193 lb (87.5 kg)    Physical Exam Vitals reviewed.  Constitutional:      General: He is not in acute distress.    Appearance: Normal appearance. He is overweight. He is not ill-appearing, toxic-appearing or diaphoretic.  HENT:     Head: Normocephalic and atraumatic.  Eyes:     General: No scleral icterus.       Right eye: No discharge.        Left eye: No discharge.     Conjunctiva/sclera: Conjunctivae normal.  Cardiovascular:     Rate and Rhythm: Normal rate and regular rhythm.     Heart sounds: Normal heart sounds. No murmur heard. No friction rub. No gallop.   Pulmonary:     Effort: Pulmonary effort is normal. No respiratory distress.     Breath sounds: Normal breath sounds. No stridor. No wheezing, rhonchi or rales.  Musculoskeletal:        General: Normal range of motion.     Cervical back: Normal range of motion.  Skin:    General: Skin is warm and dry.  Neurological:     Mental Status: He is alert and oriented to person, place, and time. Mental status is at baseline.  Psychiatric:        Mood and Affect: Mood normal.        Behavior: Behavior normal.        Thought Content: Thought content normal.        Judgment: Judgment normal.     Lab Results  Component Value Date   TSH 0.335 (L) 04/05/2019   Lab Results  Component Value Date   WBC 6.3 04/08/2019   HGB 11.1 (L) 04/08/2019   HCT 36.4 (L) 04/08/2019   MCV 85.6 04/08/2019   PLT 231 04/08/2019   Lab Results  Component Value Date  NA 141 04/08/2019   K 3.8 04/08/2019   CO2 24 04/08/2019   GLUCOSE 135 (H) 04/08/2019   BUN 14 04/08/2019   CREATININE 1.19 04/08/2019   BILITOT 0.5 07/07/2019   ALKPHOS 77 07/07/2019   AST 23 07/07/2019   ALT 25 07/07/2019   PROT 7.1 07/07/2019   ALBUMIN 4.3 07/07/2019    CALCIUM 8.8 (L) 04/08/2019   ANIONGAP 9 04/08/2019   Lab Results  Component Value Date   CHOL 126 07/07/2019   Lab Results  Component Value Date   HDL 34 (L) 07/07/2019   Lab Results  Component Value Date   LDLCALC 60 07/07/2019   Lab Results  Component Value Date   TRIG 195 (H) 07/07/2019   Lab Results  Component Value Date   CHOLHDL 3.7 07/07/2019   Lab Results  Component Value Date   HGBA1C 8.8 (H) 04/05/2019

## 2020-03-19 LAB — CMP14+EGFR
ALT: 14 IU/L (ref 0–44)
AST: 19 IU/L (ref 0–40)
Albumin/Globulin Ratio: 1.3 (ref 1.2–2.2)
Albumin: 4.2 g/dL (ref 3.7–4.7)
Alkaline Phosphatase: 91 IU/L (ref 44–121)
BUN/Creatinine Ratio: 15 (ref 10–24)
BUN: 20 mg/dL (ref 8–27)
Bilirubin Total: 0.6 mg/dL (ref 0.0–1.2)
CO2: 23 mmol/L (ref 20–29)
Calcium: 9.7 mg/dL (ref 8.6–10.2)
Chloride: 101 mmol/L (ref 96–106)
Creatinine, Ser: 1.31 mg/dL — ABNORMAL HIGH (ref 0.76–1.27)
Globulin, Total: 3.3 g/dL (ref 1.5–4.5)
Glucose: 113 mg/dL — ABNORMAL HIGH (ref 65–99)
Potassium: 4.3 mmol/L (ref 3.5–5.2)
Sodium: 136 mmol/L (ref 134–144)
Total Protein: 7.5 g/dL (ref 6.0–8.5)
eGFR: 58 mL/min/{1.73_m2} — ABNORMAL LOW (ref >59)

## 2020-03-19 LAB — CBC WITH DIFFERENTIAL/PLATELET
Basophils Absolute: 0.1 10*3/uL (ref 0.0–0.2)
Basos: 1 %
EOS (ABSOLUTE): 0.4 10*3/uL (ref 0.0–0.4)
Eos: 4 %
Hematocrit: 37.4 % — ABNORMAL LOW (ref 37.5–51.0)
Hemoglobin: 12.1 g/dL — ABNORMAL LOW (ref 13.0–17.7)
Immature Grans (Abs): 0 10*3/uL (ref 0.0–0.1)
Immature Granulocytes: 0 %
Lymphocytes Absolute: 1.6 10*3/uL (ref 0.7–3.1)
Lymphs: 20 %
MCH: 28 pg (ref 26.6–33.0)
MCHC: 32.4 g/dL (ref 31.5–35.7)
MCV: 87 fL (ref 79–97)
Monocytes Absolute: 0.7 10*3/uL (ref 0.1–0.9)
Monocytes: 9 %
Neutrophils Absolute: 5.2 10*3/uL (ref 1.4–7.0)
Neutrophils: 66 %
Platelets: 318 10*3/uL (ref 150–450)
RBC: 4.32 x10E6/uL (ref 4.14–5.80)
RDW: 13.7 % (ref 11.6–15.4)
WBC: 7.9 10*3/uL (ref 3.4–10.8)

## 2020-03-19 LAB — LIPID PANEL
Chol/HDL Ratio: 3.8 ratio (ref 0.0–5.0)
Cholesterol, Total: 122 mg/dL (ref 100–199)
HDL: 32 mg/dL — ABNORMAL LOW (ref >39)
LDL Chol Calc (NIH): 68 mg/dL (ref 0–99)
Triglycerides: 122 mg/dL (ref 0–149)
VLDL Cholesterol Cal: 22 mg/dL (ref 5–40)

## 2020-03-19 LAB — TSH: TSH: 1.08 u[IU]/mL (ref 0.450–4.500)

## 2020-03-23 ENCOUNTER — Ambulatory Visit: Payer: Medicare PPO | Admitting: Family Medicine

## 2020-03-24 ENCOUNTER — Ambulatory Visit: Payer: Medicare PPO | Admitting: Family Medicine

## 2020-04-01 ENCOUNTER — Encounter: Payer: Self-pay | Admitting: *Deleted

## 2020-04-13 ENCOUNTER — Ambulatory Visit: Payer: Medicare PPO | Admitting: Cardiovascular Disease

## 2020-04-14 ENCOUNTER — Ambulatory Visit: Payer: Medicare PPO | Admitting: Cardiovascular Disease

## 2020-05-28 ENCOUNTER — Ambulatory Visit (INDEPENDENT_AMBULATORY_CARE_PROVIDER_SITE_OTHER): Payer: Medicare PPO

## 2020-05-28 VITALS — Ht 67.0 in | Wt 182.0 lb

## 2020-05-28 DIAGNOSIS — Z0000 Encounter for general adult medical examination without abnormal findings: Secondary | ICD-10-CM | POA: Diagnosis not present

## 2020-05-28 NOTE — Progress Notes (Signed)
Subjective:   Gregory Cole is a 70 y.o. male who presents for Medicare Annual/Subsequent preventive examination.  Virtual Visit via Telephone Note  I connected with  Gregory Cole on 05/28/20 at  2:45 PM EDT by telephone and verified that I am speaking with the correct person using two identifiers.  Location: Patient: Home Provider: [facility] Persons participating in the virtual visit: patient/Nurse Health Advisor   I discussed the limitations, risks, security and privacy concerns of performing an evaluation and management service by telephone and the availability of in person appointments. The patient expressed understanding and agreed to proceed.  Interactive audio and video telecommunications were attempted between this nurse and patient, however failed, due to patient having technical difficulties OR patient did not have access to video capability.  We continued and completed visit with audio only.  Some vital signs may be absent or patient reported.   Sacoya Mcgourty E Chanley Mcenery, LPN   Review of Systems     Cardiac Risk Factors include: advanced age (>29men, >84 women);diabetes mellitus;dyslipidemia;hypertension;male gender;family history of premature cardiovascular disease;smoking/ tobacco exposure     Objective:    Today's Vitals   05/28/20 1350  Weight: 182 lb (82.6 kg)  Height: 5\' 7"  (1.702 m)   Body mass index is 28.51 kg/m.  Advanced Directives 05/28/2020 04/05/2019 06/26/2013  Does Patient Have a Medical Advance Directive? Yes No Patient does not have advance directive;Patient would not like information  Type of 06/28/2013 Power of [place];Living will - -  Copy of Healthcare Power of Attorney in Chart? No - copy requested - -  Would patient like information on creating a medical advance directive? - No - Guardian declined -  Pre-existing out of facility DNR order (yellow form or pink MOST form) - - No    Current Medications (verified) Outpatient  Encounter Medications as of 05/28/2020  Medication Sig  . albuterol (VENTOLIN HFA) 108 (90 Base) MCG/ACT inhaler Inhale 2 puffs into the lungs every 6 (six) hours as needed for wheezing or shortness of breath.  05/30/2020 amLODipine (NORVASC) 2.5 MG tablet TAKE 1 TABLET BY MOUTH EVERY DAY  . aspirin EC 81 MG tablet Take 81 mg by mouth daily. Evening 1600  . atorvastatin (LIPITOR) 80 MG tablet Take 1 tablet (80 mg total) by mouth daily at 6 PM.  . diclofenac Sodium (VOLTAREN) 1 % GEL Apply 2 g topically 4 (four) times daily.  . insulin aspart protamine - aspart (NOVOLOG MIX 70/30 FLEXPEN) (70-30) 100 UNIT/ML FlexPen Inject 36 Units into the skin daily. Before dinner.  . isosorbide mononitrate (IMDUR) 120 MG 24 hr tablet Take 120 mg by mouth daily.  Marland Kitchen levothyroxine (SYNTHROID, LEVOTHROID) 88 MCG tablet Take 88 mcg by mouth daily before breakfast.  . losartan (COZAAR) 100 MG tablet Take 100 mg by mouth daily. Evening 1600  . metFORMIN (GLUCOPHAGE) 500 MG tablet Take 500 mg by mouth 2 (two) times daily.  . metoprolol tartrate (LOPRESSOR) 25 MG tablet Take 1 tablet (25 mg total) by mouth 2 (two) times daily.  . nitroGLYCERIN (NITROSTAT) 0.4 MG SL tablet Place under the tongue.  . Omega-3 Fatty Acids (FISH OIL PO) Take 1,000 mg by mouth 2 (two) times daily.  Marland Kitchen omeprazole (PRILOSEC) 20 MG capsule Take 20 mg by mouth daily. Evening 1600  . pioglitazone (ACTOS) 45 MG tablet Take 45 mg by mouth daily. Evening 1600  . ticagrelor (BRILINTA) 90 MG TABS tablet Take 1 tablet (90 mg total) by mouth 2 (two) times  daily.   No facility-administered encounter medications on file as of 05/28/2020.    Allergies (verified) Patient has no known allergies.   History: Past Medical History:  Diagnosis Date  . Coronary artery disease   . Diabetes mellitus without complication (HCC)   . GERD (gastroesophageal reflux disease)   . Glaucoma   . Hypercholesteremia   . Hypertension   . Lesion of lung    [facility]  . Myocardial infarction (HCC)    x3  . Postablative hypothyroidism 03/18/2020  . Sleep apnea    unable to tolerate CPAP   Past Surgical History:  Procedure Laterality Date  . BOWEL RESECTION    . CARDIAC CATHETERIZATION     2 stents in heart  . CATARACT EXTRACTION W/PHACO Left 07/01/2013   Procedure: CATARACT EXTRACTION PHACO AND INTRAOCULAR LENS PLACEMENT (IOC);  Surgeon: Loraine Leriche T. Nile Riggs, MD;  Location: [facility];  Service: Ophthalmology;  Laterality: Left;  CDE 6.84  . CORONARY STENT INTERVENTION N/A 04/07/2019   Procedure: CORONARY STENT INTERVENTION;  Surgeon: Lennette Bihari, MD;  Location: [facility] INVASIVE CV LAB;  Service: Cardiovascular;  Laterality: N/A;  . HAND SURGERY Left   . LEFT HEART CATH AND CORONARY ANGIOGRAPHY N/A 04/07/2019   Procedure: LEFT HEART CATH AND CORONARY ANGIOGRAPHY;  Surgeon: Lennette Bihari, MD;  Location: [facility] INVASIVE CV LAB;  Service: Cardiovascular;  Laterality: N/A;  . NASAL SEPTUM SURGERY    . SHOULDER ARTHROSCOPY Right    Family History  Problem Relation Age of Onset  . Stroke Mother   . Heart disease Father    Social History   Socioeconomic History  . Marital status: Married    Spouse name: Not on file  . Number of children: 1  . Years of education: Not on file  . Highest education level: Not on file  Occupational History  . Not on file  Tobacco Use  . Smoking status: Former Smoker    Packs/day: 2.00    Years: 40.00    Pack years: 80.00    Types: Cigarettes  . Smokeless tobacco: Never Used  Vaping Use  . Vaping Use: Never used  Substance and Sexual Activity  . Alcohol use: Yes    Comment: occasional glass of wine  . Drug use: No  . Sexual activity: Not Currently  Other Topics Concern  . Not on file  Social History Narrative   Lives with his wife - first floor of apartment complex   Social Determinants of Health   Financial Resource Strain: Low Risk   . Difficulty of Paying Living Expenses: Not hard at all  Food  Insecurity: No Food Insecurity  . Worried About Programme researcher, broadcasting/film/video in the Last Year: Never true  . Ran Out of Food in the Last Year: Never true  Transportation Needs: No Transportation Needs  . Lack of Transportation (Medical): No  . Lack of Transportation (Non-Medical): No  Physical Activity: Sufficiently Active  . Days of Exercise per Week: 7 days  . Minutes of Exercise per Session: 30 min  Stress: No Stress Concern Present  . Feeling of Stress : Not at all  Social Connections: Moderately Integrated  . Frequency of Communication with Friends and Family: Once a week  . Frequency of Social Gatherings with Friends and Family: Once a week  . Attends Religious Services: More than 4 times per year  . Active Member of Clubs or Organizations: Yes  . Attends Banker Meetings: More  than 4 times per year  . Marital Status: Married    Tobacco Counseling Counseling given: Not Answered   Clinical Intake:  Pre-visit preparation completed: Yes  Pain : No/denies pain     BMI - recorded: 28.51 Nutritional Status: BMI 25 -29 Overweight Nutritional Risks: None Diabetes: Yes CBG done?: No Did pt. bring in CBG monitor from home?: No  How often do you need to have someone help you when you read instructions, pamphlets, or other written materials from your doctor or pharmacy?: 1 - Never  Nutrition Risk Assessment:  Has the patient had any N/V/D within the last 2 months?  No  Does the patient have any non-healing wounds?  No  Has the patient had any unintentional weight loss or weight gain?  No   Diabetes:  Is the patient diabetic?  Yes  If diabetic, was a CBG obtained today?  No  Did the patient bring in their glucometer from home?  No  How often do you monitor your CBG's? 2-3 times per week.   Financial Strains and Diabetes Management:  Are you having any financial strains with the device, your supplies or your medication? No .  Does the patient want to be seen by  Chronic Care Management for management of their diabetes?  No  Would the patient like to be referred to a Nutritionist or for Diabetic Management?  No   Diabetic Exams:  [facility]  Diabetic Foot Exam: Completed 03/18/20. Pt has been advised about the importance in completing this exam. Pt is scheduled for diabetic foot exam on 03/2021.    Interpreter Needed?: No  Information entered by :: Navaya Wiatrek, LPN   Activities of Daily Living In your present state of health, do you have any difficulty performing the following activities: 05/28/2020  Hearing? Y  Vision? N  Difficulty concentrating or making decisions? N  Walking or climbing stairs? Y  Dressing or bathing? N  Doing errands, shopping? N  Preparing Food and eating ? N  Using the Toilet? N  In the past six months, have you accidently leaked urine? N  Do you have problems with loss of bowel control? N  Managing your Medications? N  Managing your Finances? N  Housekeeping or managing your Housekeeping? N  Some recent data might be hidden    Patient Care Team: Gwenlyn FudgeJoyce, Britney F, FNP as PCP - General (Family Medicine) Lennette BihariKelly, Thomas A, MD as PCP - Cardiology (Cardiology) Azalee CourseMeng, Hao, [place] as Physician Assistant (Cardiology)  Indicate any recent Medical Services you may have received from other than [facility] providers in the past year (date may be approximate).     Assessment:   This is a routine wellness examination for Gregory NajjarLarry.  Hearing/Vision screen  Hearing Screening   125Hz  250Hz  500Hz  1000Hz  2000Hz  3000Hz  4000Hz  6000Hz  8000Hz   Right ear:           Left ear:           Comments: C/o moderate hearing loss - declines hearing aids - has them, but they irritate his ears [facility]  Vision Screening Comments: [facility] - wears [facility]eglasses - up to date with eye exam  Dietary issues and exercise activities discussed: Current Exercise Habits: Home exercise routine, Time  (Minutes): 30, Frequency (Times/Week): 7, Weekly Exercise (Minutes/Week): 210, Intensity: Mild, Exercise limited by: orthopedic condition(s);cardiac condition(s)  Goals Addressed  This Visit's Progress   . HEMOGLOBIN A1C < 7        Depression Screen PHQ 2/9 Scores 05/28/2020 03/18/2020 08/21/2019 11/20/2018 04/21/2014  PHQ - 2 Score 0 0 0 0 0  PHQ- 9 Score - - 0 - -    Fall Risk Fall Risk  05/28/2020 03/18/2020 08/21/2019 11/20/2018 04/21/2014  Falls in the past year? 1 0 0 1 No  Number falls in past yr: 0 - 0 0 -  Comment - - - Bruises -  Injury with Fall? 0 - 0 0 -  Risk for fall due to : Orthopedic patient;Impaired vision;History of fall(s) - - - -  Follow up Education provided;Falls prevention discussed - Falls evaluation completed - -    FALL RISK PREVENTION PERTAINING TO THE HOME:  Any stairs in or around the home? No  If so, are there any without handrails? No  Home free of loose throw rugs in walkways, pet beds, electrical cords, etc? Yes  Adequate lighting in your home to reduce risk of falls? Yes   ASSISTIVE DEVICES UTILIZED TO PREVENT FALLS:  Life alert? No  Use of a cane, walker or w/c? No  Grab bars in the bathroom? No  Shower chair or bench in shower? No  Elevated toilet seat or a handicapped toilet? No   TIMED UP AND GO:  Was the test performed? No . Telephonic visit.  Cognitive Function:     6CIT Screen 05/28/2020  What Year? 0 points  What month? 0 points  What time? 0 points  Count back from 20 0 points  Months in reverse 0 points  Repeat phrase 0 points  Total Score 0    Immunizations Immunization History  Administered Date(s) Administered  . Fluad Quad(high Dose 65+) 09/30/2018  . Moderna Sars-Covid-2 Vaccination 03/06/2019, 03/31/2019, 08/28/2019, 04/09/2020  . Pneumococcal Conjugate-13 03/18/2020  . Pneumococcal Polysaccharide-23 12/01/2009  . Tdap 03/27/2011    TDAP status: Up to date  Flu Vaccine status: Up to  date  Pneumococcal vaccine status: Up to date  Covid-19 vaccine status: Completed vaccines  Qualifies for Shingles Vaccine? Yes   Zostavax completed No   Shingrix Completed?: No.    Education has been provided regarding the importance of this vaccine. Patient has been advised to call insurance company to determine out of pocket expense if they have not yet received this vaccine. Advised may also receive vaccine at local pharmacy or Health Dept. Verbalized acceptance and understanding.  Screening Tests Health Maintenance  Topic Date Due  . OPHTHALMOLOGY EXAM  05/09/2019  . Hepatitis C Screening  03/18/2021 (Originally 12/19/1968)  . INFLUENZA VACCINE  08/09/2020  . HEMOGLOBIN A1C  09/18/2020  . FOOT EXAM  03/18/2021  . PNA vac Low Risk Adult (2 of 2 - PPSV23) 03/18/2021  . TETANUS/TDAP  03/26/2021  . COLONOSCOPY (Pts 45-55yrs Insurance coverage will need to be confirmed)  03/19/2028  . COVID-19 Vaccine  Completed  . HPV VACCINES  Aged Out    Health Maintenance  Health Maintenance Due  Topic Date Due  . OPHTHALMOLOGY EXAM  05/09/2019    Colorectal cancer screening: Type of screening: Colonoscopy. Completed 03/20/2018. Repeat every 10 years  Lung Cancer Screening: (Low Dose CT Chest recommended if Age 12-80 years, 30 pack-year currently smoking OR have quit w/in 15years.) does qualify.   Lung Cancer Screening Referral: declined  Additional Screening:  Hepatitis C Screening: does qualify; Needs at next visit.  Vision Screening: Recommended annual ophthalmology exams for early detection of  glaucoma and other disorders of the eye. Is the patient up to date with their annual eye exam?  Yes  Who is the provider or what is the name of the office in which the patient attends [facility] If pt is not established with a provider, would they like to be referred to a provider to establish care? No .   Dental Screening: Recommended annual dental exams for proper oral  hygiene  Community Resource Referral / Chronic Care Management: CRR required this visit?  No   CCM required this visit?  No      Plan:     I have personally reviewed and noted the following in the patient's chart:   . Medical and social history . Use of alcohol, tobacco or illicit drugs  . Current medications and supplements including opioid prescriptions. Patient is not currently taking opioid prescriptions. . Functional ability and status . Nutritional status . Physical activity . Advanced directives . List of other physicians . Hospitalizations, surgeries, and ER visits in previous 12 months . Vitals . Screenings to include cognitive, depression, and falls . Referrals and appointments  In addition, I have reviewed and discussed with patient certain preventive protocols, quality metrics, and best practice recommendations. A written personalized care plan for preventive services as well as general preventive health recommendations were provided to patient.     [place] Constable, LPN   05/14/3974   Nurse Notes: None

## 2020-05-28 NOTE — Patient Instructions (Signed)
[name] , Thank you for taking time to come for your Medicare Wellness Visit. I appreciate your ongoing commitment to your health goals. Please review the following plan we discussed and let me know if I can assist you in the future.   Screening recommendations/referrals: Colonoscopy: Done 03/2018 - may not repeat, may be open to cologuard (ask [facility]) Recommended yearly ophthalmology/optometry visit for glaucoma screening and checkup Recommended yearly dental visit for hygiene and checkup  Vaccinations: Influenza vaccine: done 2021;  Due every fall  Pneumococcal vaccine: Done 12/01/2009 & 03/18/2020 Tdap vaccine: Done 03/27/2011 - Repeat in 10 years Shingles vaccine: Due -not sure if he wants this   Covid-19: Done 03/06/19, 03/31/19, 08/28/19, & 04/09/20  Advanced directives: Please bring a copy of your health care power of attorney and living will to the office to be added to your chart at your convenience.  Conditions/risks identified: Aim for 30 minutes of exercise or brisk walking each day, drink 6-8 glasses of water and eat lots of fruits and vegetables.  Next appointment: Follow up in one year for your annual wellness visit.   Preventive Care 28 Years and Older, Male  Preventive care refers to lifestyle choices and visits with your health care provider that can promote health and wellness. What does preventive care include?  A yearly physical exam. This is also called an annual well check.  Dental exams once or twice a year.  Routine eye exams. Ask your health care provider how often you should have your eyes checked.  Personal lifestyle choices, including:  Daily care of your teeth and gums.  Regular physical activity.  Eating a healthy diet.  Avoiding tobacco and drug use.  Limiting alcohol use.  Practicing safe sex.  Taking low doses of aspirin every day.  Taking vitamin and mineral supplements as recommended by your health care provider. What happens during an  annual well check? The services and screenings done by your health care provider during your annual well check will depend on your age, overall health, lifestyle risk factors, and family history of disease. Counseling  Your health care provider may ask you questions about your:  Alcohol use.  Tobacco use.  Drug use.  Emotional well-being.  Home and relationship well-being.  Sexual activity.  Eating habits.  History of falls.  Memory and ability to understand (cognition).  Work and work Astronomer. Screening  You may have the following tests or measurements:  Height, weight, and BMI.  Blood pressure.  Lipid and cholesterol levels. These may be checked every 5 years, or more frequently if you are over 64 years old.  Skin check.  Lung cancer screening. You may have this screening every year starting at age 76 if you have a 30-pack-year history of smoking and currently smoke or have quit within the past 15 years.  Fecal occult blood test (FOBT) of the stool. You may have this test every year starting at age 49.  Flexible sigmoidoscopy or colonoscopy. You may have a sigmoidoscopy every 5 years or a colonoscopy every 10 years starting at age 15.  Prostate cancer screening. Recommendations will vary depending on your family history and other risks.  Hepatitis C blood test.  Hepatitis B blood test.  Sexually transmitted disease (STD) testing.  Diabetes screening. This is done by checking your blood sugar (glucose) after you have not eaten for a while (fasting). You may have this done every 1-3 years.  Abdominal aortic aneurysm (AAA) screening. You may need this if you  are a current or former smoker.  Osteoporosis. You may be screened starting at age 74 if you are at high risk. Talk with your health care provider about your test results, treatment options, and if necessary, the need for more tests. Vaccines  Your health care provider may recommend certain vaccines,  such as:  Influenza vaccine. This is recommended every year.  Tetanus, diphtheria, and acellular pertussis (Tdap, Td) vaccine. You may need a Td booster every 10 years.  Zoster vaccine. You may need this after age 32.  Pneumococcal 13-valent conjugate (PCV13) vaccine. One dose is recommended after age 26.  Pneumococcal polysaccharide (PPSV23) vaccine. One dose is recommended after age 69. Talk to your health care provider about which screenings and vaccines you need and how often you need them. This information is not intended to replace advice given to you by your health care provider. Make sure you discuss any questions you have with your health care provider. Document Released: 01/22/2015 Document Revised: 09/15/2015 Document Reviewed: 10/27/2014 Elsevier Interactive Patient Education  2017 [place] Prevention in the Home Falls can cause injuries. They can happen to people of all ages. There are many things you can do to make your home safe and to help prevent falls. What can I do on the outside of my home?  Regularly fix the edges of walkways and driveways and fix any cracks.  Remove anything that might make you trip as you walk through a door, such as a raised step or threshold.  Trim any bushes or trees on the path to your home.  Use bright outdoor lighting.  Clear any walking paths of anything that might make someone trip, such as rocks or tools.  Regularly check to see if handrails are loose or broken. Make sure that both sides of any steps have handrails.  Any raised decks and porches should have guardrails on the edges.  Have any leaves, snow, or ice cleared regularly.  Use sand or salt on walking paths during winter.  Clean up any spills in your garage right away. This includes oil or grease spills. What can I do in the bathroom?  Use night lights.  Install grab bars by the toilet and in the tub and shower. Do not use towel bars as grab bars.  Use  non-skid mats or decals in the tub or shower.  If you need to sit down in the shower, use a plastic, non-slip stool.  Keep the floor dry. Clean up any water that spills on the floor as soon as it happens.  Remove soap buildup in the tub or shower regularly.  Attach bath mats securely with double-sided non-slip rug tape.  Do not have throw rugs and other things on the floor that can make you trip. What can I do in the bedroom?  Use night lights.  Make sure that you have a light by your bed that is easy to reach.  Do not use any sheets or blankets that are too big for your bed. They should not hang down onto the floor.  Have a firm chair that has side arms. You can use this for support while you get dressed.  Do not have throw rugs and other things on the floor that can make you trip. What can I do in the kitchen?  Clean up any spills right away.  Avoid walking on wet floors.  Keep items that you use a lot in easy-to-reach places.  If you need to reach  something above you, use a strong step stool that has a grab bar.  Keep electrical cords out of the way.  Do not use floor polish or wax that makes floors slippery. If you must use wax, use non-skid floor wax.  Do not have throw rugs and other things on the floor that can make you trip. What can I do with my stairs?  Do not leave any items on the stairs.  Make sure that there are handrails on both sides of the stairs and use them. Fix handrails that are broken or loose. Make sure that handrails are as long as the stairways.  Check any carpeting to make sure that it is firmly attached to the stairs. Fix any carpet that is loose or worn.  Avoid having throw rugs at the top or bottom of the stairs. If you do have throw rugs, attach them to the floor with carpet tape.  Make sure that you have a light switch at the top of the stairs and the bottom of the stairs. If you do not have them, ask someone to add them for you. What  else can I do to help prevent falls?  Wear shoes that:  Do not have high heels.  Have rubber bottoms.  Are comfortable and fit you well.  Are closed at the toe. Do not wear sandals.  If you use a stepladder:  Make sure that it is fully opened. Do not climb a closed stepladder.  Make sure that both sides of the stepladder are locked into place.  Ask someone to hold it for you, if possible.  Clearly mark and make sure that you can see:  Any grab bars or handrails.  First and last steps.  Where the edge of each step is.  Use tools that help you move around (mobility aids) if they are needed. These include:  Canes.  Walkers.  Scooters.  Crutches.  Turn on the lights when you go into a dark area. Replace any light bulbs as soon as they burn out.  Set up your furniture so you have a clear path. Avoid moving your furniture around.  If any of your floors are uneven, fix them.  If there are any pets around you, be aware of where they are.  Review your medicines with your doctor. Some medicines can make you feel dizzy. This can increase your chance of falling. Ask your doctor what other things that you can do to help prevent falls. This information is not intended to replace advice given to you by your health care provider. Make sure you discuss any questions you have with your health care provider. Document Released: 10/22/2008 Document Revised: 06/03/2015 Document Reviewed: 01/30/2014 Elsevier Interactive Patient Education  2017 [employer].

## 2020-07-20 ENCOUNTER — Inpatient Hospital Stay (HOSPITAL_COMMUNITY)
Admission: EM | Disposition: A | Discharge status: Home / Self Care | Payer: Self-pay | Source: Home / Self Care | Attending: Interventional Cardiology

## 2020-07-20 ENCOUNTER — Inpatient Hospital Stay (HOSPITAL_COMMUNITY)
Admission: EM | Admit: 2020-07-20 | Discharge: 2020-07-23 | DRG: 247 | Disposition: A | Discharge status: Home / Self Care | Payer: Medicare PPO | Attending: Interventional Cardiology | Admitting: Interventional Cardiology

## 2020-07-20 DIAGNOSIS — I214 Non-ST elevation (NSTEMI) myocardial infarction: Secondary | ICD-10-CM

## 2020-07-20 DIAGNOSIS — G4730 Sleep apnea, unspecified: Secondary | ICD-10-CM | POA: Diagnosis present

## 2020-07-20 DIAGNOSIS — K219 Gastro-esophageal reflux disease without esophagitis: Secondary | ICD-10-CM | POA: Diagnosis present

## 2020-07-20 DIAGNOSIS — I2119 ST elevation (STEMI) myocardial infarction involving other coronary artery of inferior wall: Secondary | ICD-10-CM | POA: Diagnosis not present

## 2020-07-20 DIAGNOSIS — Z79899 Other long term (current) drug therapy: Secondary | ICD-10-CM

## 2020-07-20 DIAGNOSIS — E7800 Pure hypercholesterolemia, unspecified: Secondary | ICD-10-CM | POA: Diagnosis present

## 2020-07-20 DIAGNOSIS — Z20822 Contact with and (suspected) exposure to covid-19: Secondary | ICD-10-CM | POA: Diagnosis present

## 2020-07-20 DIAGNOSIS — Z7982 Long term (current) use of aspirin: Secondary | ICD-10-CM | POA: Diagnosis not present

## 2020-07-20 DIAGNOSIS — I2111 ST elevation (STEMI) myocardial infarction involving right coronary artery: Principal | ICD-10-CM | POA: Diagnosis present

## 2020-07-20 DIAGNOSIS — Z7902 Long term (current) use of antithrombotics/antiplatelets: Secondary | ICD-10-CM | POA: Diagnosis not present

## 2020-07-20 DIAGNOSIS — R531 Weakness: Secondary | ICD-10-CM | POA: Diagnosis not present

## 2020-07-20 DIAGNOSIS — I252 Old myocardial infarction: Secondary | ICD-10-CM | POA: Diagnosis not present

## 2020-07-20 DIAGNOSIS — E1169 Type 2 diabetes mellitus with other specified complication: Secondary | ICD-10-CM | POA: Diagnosis not present

## 2020-07-20 DIAGNOSIS — Z79890 Hormone replacement therapy (postmenopausal): Secondary | ICD-10-CM | POA: Diagnosis not present

## 2020-07-20 DIAGNOSIS — Z720 Tobacco use: Secondary | ICD-10-CM | POA: Diagnosis not present

## 2020-07-20 DIAGNOSIS — Z794 Long term (current) use of insulin: Secondary | ICD-10-CM | POA: Diagnosis not present

## 2020-07-20 DIAGNOSIS — H409 Unspecified glaucoma: Secondary | ICD-10-CM | POA: Diagnosis present

## 2020-07-20 DIAGNOSIS — R079 Chest pain, unspecified: Secondary | ICD-10-CM | POA: Diagnosis not present

## 2020-07-20 DIAGNOSIS — I236 Thrombosis of atrium, auricular appendage, and ventricle as current complications following acute myocardial infarction: Secondary | ICD-10-CM | POA: Diagnosis not present

## 2020-07-20 DIAGNOSIS — I110 Hypertensive heart disease with heart failure: Secondary | ICD-10-CM | POA: Diagnosis not present

## 2020-07-20 DIAGNOSIS — Z9861 Coronary angioplasty status: Secondary | ICD-10-CM

## 2020-07-20 DIAGNOSIS — I255 Ischemic cardiomyopathy: Secondary | ICD-10-CM | POA: Clinically undetermined

## 2020-07-20 DIAGNOSIS — E1159 Type 2 diabetes mellitus with other circulatory complications: Secondary | ICD-10-CM | POA: Diagnosis present

## 2020-07-20 DIAGNOSIS — Z8249 Family history of ischemic heart disease and other diseases of the circulatory system: Secondary | ICD-10-CM | POA: Diagnosis not present

## 2020-07-20 DIAGNOSIS — I2510 Atherosclerotic heart disease of native coronary artery without angina pectoris: Secondary | ICD-10-CM | POA: Diagnosis not present

## 2020-07-20 DIAGNOSIS — R0902 Hypoxemia: Secondary | ICD-10-CM | POA: Diagnosis not present

## 2020-07-20 DIAGNOSIS — E890 Postprocedural hypothyroidism: Secondary | ICD-10-CM | POA: Diagnosis not present

## 2020-07-20 DIAGNOSIS — I5021 Acute systolic (congestive) heart failure: Secondary | ICD-10-CM | POA: Diagnosis not present

## 2020-07-20 DIAGNOSIS — R0689 Other abnormalities of breathing: Secondary | ICD-10-CM | POA: Diagnosis not present

## 2020-07-20 DIAGNOSIS — Z823 Family history of stroke: Secondary | ICD-10-CM

## 2020-07-20 DIAGNOSIS — M1990 Unspecified osteoarthritis, unspecified site: Secondary | ICD-10-CM

## 2020-07-20 DIAGNOSIS — I10 Essential (primary) hypertension: Secondary | ICD-10-CM | POA: Diagnosis present

## 2020-07-20 DIAGNOSIS — I25110 Atherosclerotic heart disease of native coronary artery with unstable angina pectoris: Secondary | ICD-10-CM | POA: Diagnosis not present

## 2020-07-20 DIAGNOSIS — E118 Type 2 diabetes mellitus with unspecified complications: Secondary | ICD-10-CM | POA: Diagnosis present

## 2020-07-20 DIAGNOSIS — I5022 Chronic systolic (congestive) heart failure: Secondary | ICD-10-CM | POA: Diagnosis present

## 2020-07-20 DIAGNOSIS — Z955 Presence of coronary angioplasty implant and graft: Secondary | ICD-10-CM | POA: Diagnosis not present

## 2020-07-20 DIAGNOSIS — Z7984 Long term (current) use of oral hypoglycemic drugs: Secondary | ICD-10-CM | POA: Diagnosis not present

## 2020-07-20 DIAGNOSIS — N179 Acute kidney failure, unspecified: Secondary | ICD-10-CM | POA: Diagnosis not present

## 2020-07-20 DIAGNOSIS — Z87891 Personal history of nicotine dependence: Secondary | ICD-10-CM

## 2020-07-20 DIAGNOSIS — E785 Hyperlipidemia, unspecified: Secondary | ICD-10-CM | POA: Diagnosis not present

## 2020-07-20 HISTORY — PX: CORONARY/GRAFT ACUTE MI REVASCULARIZATION: CATH118305

## 2020-07-20 HISTORY — PX: LEFT HEART CATH AND CORONARY ANGIOGRAPHY: CATH118249

## 2020-07-20 LAB — RESP PANEL BY RT-PCR (FLU A&B, COVID) ARPGX2
Influenza A by PCR: NEGATIVE
Influenza B by PCR: NEGATIVE
SARS Coronavirus 2 by RT PCR: NEGATIVE

## 2020-07-20 LAB — POCT ACTIVATED CLOTTING TIME
Activated Clotting Time: 248 seconds
Activated Clotting Time: 335 seconds

## 2020-07-20 LAB — GLUCOSE, CAPILLARY: Glucose-Capillary: 152 mg/dL — ABNORMAL HIGH (ref 70–99)

## 2020-07-20 SURGERY — CORONARY/GRAFT ACUTE MI REVASCULARIZATION
Anesthesia: LOCAL

## 2020-07-20 MED ORDER — PANTOPRAZOLE SODIUM 40 MG PO TBEC
40.0000 mg | DELAYED_RELEASE_TABLET | Freq: Every day | ORAL | Status: DC
  Administered 2020-07-21 – 2020-07-23 (×3): 40 mg via ORAL
  Filled 2020-07-20 (×3): qty 1

## 2020-07-20 MED ORDER — SODIUM CHLORIDE 0.9 % IV SOLN
INTRAVENOUS | Status: AC | PRN
  Administered 2020-07-20: 10 mL/h via INTRAVENOUS

## 2020-07-20 MED ORDER — ONDANSETRON HCL 4 MG/2ML IJ SOLN: 4.0000 mg | Freq: Four times a day (QID) | INTRAMUSCULAR | Status: DC | PRN

## 2020-07-20 MED ORDER — LOSARTAN POTASSIUM 50 MG PO TABS
100.0000 mg | ORAL_TABLET | Freq: Every day | ORAL | Status: DC
  Administered 2020-07-21: 100 mg via ORAL
  Filled 2020-07-20 (×2): qty 2

## 2020-07-20 MED ORDER — ASPIRIN EC 81 MG PO TBEC
81.0000 mg | DELAYED_RELEASE_TABLET | ORAL | Status: DC
  Administered 2020-07-21 – 2020-07-22 (×2): 81 mg via ORAL
  Filled 2020-07-20 (×2): qty 1

## 2020-07-20 MED ORDER — LABETALOL HCL 5 MG/ML IV SOLN: 10.0000 mg | INTRAVENOUS | Status: AC | PRN

## 2020-07-20 MED ORDER — AMLODIPINE BESYLATE 2.5 MG PO TABS
2.5000 mg | ORAL_TABLET | Freq: Every day | ORAL | Status: DC
  Filled 2020-07-20: qty 1

## 2020-07-20 MED ORDER — OMEGA-3-ACID ETHYL ESTERS 1 G PO CAPS
1.0000 g | ORAL_CAPSULE | Freq: Two times a day (BID) | ORAL | Status: DC
  Administered 2020-07-20 – 2020-07-23 (×6): 1 g via ORAL
  Filled 2020-07-20 (×6): qty 1

## 2020-07-20 MED ORDER — LIDOCAINE HCL (PF) 1 % IJ SOLN
INTRAMUSCULAR | Status: DC | PRN
  Administered 2020-07-20: 2 mL

## 2020-07-20 MED ORDER — HEPARIN SODIUM (PORCINE) 5000 UNIT/ML IJ SOLN
5000.0000 [IU] | Freq: Three times a day (TID) | INTRAMUSCULAR | Status: DC
  Administered 2020-07-20 – 2020-07-21 (×2): 5000 [IU] via SUBCUTANEOUS
  Filled 2020-07-20 (×2): qty 1

## 2020-07-20 MED ORDER — TICAGRELOR 90 MG PO TABS
ORAL_TABLET | ORAL | Status: DC | PRN
  Administered 2020-07-20: 180 mg via ORAL

## 2020-07-20 MED ORDER — ASPIRIN 81 MG PO CHEW: 81.0000 mg | CHEWABLE_TABLET | Freq: Every day | ORAL | Status: DC

## 2020-07-20 MED ORDER — OXYCODONE HCL 5 MG PO TABS: 5.0000 mg | ORAL_TABLET | ORAL | Status: DC | PRN

## 2020-07-20 MED ORDER — INSULIN ASPART 100 UNIT/ML IJ SOLN: 0.0000 [IU] | Freq: Every day | INTRAMUSCULAR | Status: DC

## 2020-07-20 MED ORDER — TICAGRELOR 90 MG PO TABS
90.0000 mg | ORAL_TABLET | Freq: Two times a day (BID) | ORAL | Status: AC
  Administered 2020-07-20 – 2020-07-21 (×3): 90 mg via ORAL
  Filled 2020-07-20 (×3): qty 1

## 2020-07-20 MED ORDER — ISOSORBIDE MONONITRATE ER 60 MG PO TB24: 120.0000 mg | ORAL_TABLET | Freq: Every day | ORAL | Status: DC

## 2020-07-20 MED ORDER — HYDRALAZINE HCL 20 MG/ML IJ SOLN: 10.0000 mg | INTRAMUSCULAR | Status: AC | PRN

## 2020-07-20 MED ORDER — VERAPAMIL HCL 2.5 MG/ML IV SOLN
INTRAVENOUS | Status: AC
  Filled 2020-07-20: qty 2

## 2020-07-20 MED ORDER — HEPARIN SODIUM (PORCINE) 1000 UNIT/ML IJ SOLN
INTRAMUSCULAR | Status: DC | PRN
  Administered 2020-07-20: 9000 [IU] via INTRAVENOUS
  Administered 2020-07-20: 3000 [IU] via INTRAVENOUS

## 2020-07-20 MED ORDER — METOPROLOL TARTRATE 25 MG PO TABS
25.0000 mg | ORAL_TABLET | Freq: Two times a day (BID) | ORAL | Status: DC
  Administered 2020-07-21 – 2020-07-22 (×3): 25 mg via ORAL
  Filled 2020-07-20 (×3): qty 1

## 2020-07-20 MED ORDER — HEPARIN (PORCINE) IN NACL 1000-0.9 UT/500ML-% IV SOLN
INTRAVENOUS | Status: DC | PRN
  Administered 2020-07-20 (×2): 500 mL

## 2020-07-20 MED ORDER — IOHEXOL 350 MG/ML SOLN
INTRAVENOUS | Status: DC | PRN
  Administered 2020-07-20: 85 mL

## 2020-07-20 MED ORDER — PIOGLITAZONE HCL 30 MG PO TABS
45.0000 mg | ORAL_TABLET | Freq: Every day | ORAL | Status: DC
  Filled 2020-07-20: qty 1

## 2020-07-20 MED ORDER — HEPARIN SODIUM (PORCINE) 1000 UNIT/ML IJ SOLN
INTRAMUSCULAR | Status: AC
  Filled 2020-07-20: qty 1

## 2020-07-20 MED ORDER — HEPARIN (PORCINE) IN NACL 1000-0.9 UT/500ML-% IV SOLN
INTRAVENOUS | Status: AC
  Filled 2020-07-20: qty 1000

## 2020-07-20 MED ORDER — VERAPAMIL HCL 2.5 MG/ML IV SOLN
INTRAVENOUS | Status: DC | PRN
  Administered 2020-07-20: 10 mL via INTRA_ARTERIAL

## 2020-07-20 MED ORDER — INSULIN ASPART PROT & ASPART (70-30 MIX) 100 UNIT/ML ~~LOC~~ SUSP
36.0000 [IU] | Freq: Every day | SUBCUTANEOUS | Status: DC
  Administered 2020-07-21 – 2020-07-23 (×2): 36 [IU] via SUBCUTANEOUS
  Filled 2020-07-20 (×2): qty 10

## 2020-07-20 MED ORDER — SODIUM CHLORIDE 0.9% FLUSH
3.0000 mL | Freq: Two times a day (BID) | INTRAVENOUS | Status: DC
  Administered 2020-07-21 – 2020-07-23 (×6): 3 mL via INTRAVENOUS

## 2020-07-20 MED ORDER — CHLORHEXIDINE GLUCONATE CLOTH 2 % EX PADS
6.0000 | MEDICATED_PAD | Freq: Every day | CUTANEOUS | Status: DC
  Administered 2020-07-21: 6 via TOPICAL

## 2020-07-20 MED ORDER — SODIUM CHLORIDE 0.9% FLUSH: 3.0000 mL | INTRAVENOUS | Status: DC | PRN

## 2020-07-20 MED ORDER — INSULIN ASPART 100 UNIT/ML IJ SOLN
0.0000 [IU] | Freq: Three times a day (TID) | INTRAMUSCULAR | Status: DC
  Administered 2020-07-21 (×2): 2 [IU] via SUBCUTANEOUS

## 2020-07-20 MED ORDER — ACETAMINOPHEN 325 MG PO TABS
650.0000 mg | ORAL_TABLET | ORAL | Status: DC | PRN
  Filled 2020-07-20: qty 2

## 2020-07-20 MED ORDER — LEVOTHYROXINE SODIUM 88 MCG PO TABS
88.0000 ug | ORAL_TABLET | Freq: Every day | ORAL | Status: DC
  Administered 2020-07-21 – 2020-07-23 (×3): 88 ug via ORAL
  Filled 2020-07-20 (×3): qty 1

## 2020-07-20 MED ORDER — ATORVASTATIN CALCIUM 80 MG PO TABS
80.0000 mg | ORAL_TABLET | Freq: Every day | ORAL | Status: DC
  Administered 2020-07-21 – 2020-07-22 (×2): 80 mg via ORAL
  Filled 2020-07-20 (×2): qty 1

## 2020-07-20 MED ORDER — ACETAMINOPHEN 325 MG PO TABS: 650.0000 mg | ORAL_TABLET | ORAL | Status: DC | PRN

## 2020-07-20 MED ORDER — TICAGRELOR 90 MG PO TABS
ORAL_TABLET | ORAL | Status: AC
  Filled 2020-07-20: qty 2

## 2020-07-20 MED ORDER — SODIUM CHLORIDE 0.9 % IV SOLN: INTRAVENOUS | Status: AC

## 2020-07-20 MED ORDER — NITROGLYCERIN 0.4 MG SL SUBL: 0.4000 mg | SUBLINGUAL_TABLET | SUBLINGUAL | Status: DC | PRN

## 2020-07-20 MED ORDER — LIDOCAINE HCL (PF) 1 % IJ SOLN
INTRAMUSCULAR | Status: AC
  Filled 2020-07-20: qty 30

## 2020-07-20 MED ORDER — ORAL CARE MOUTH RINSE
15.0000 mL | Freq: Two times a day (BID) | OROMUCOSAL | Status: DC
  Administered 2020-07-21 – 2020-07-23 (×5): 15 mL via OROMUCOSAL

## 2020-07-20 MED ORDER — SODIUM CHLORIDE 0.9 % IV SOLN: 250.0000 mL | INTRAVENOUS | Status: DC | PRN

## 2020-07-20 SURGICAL SUPPLY — 16 items
BALLN SAPPHIRE 3.0X12 (BALLOONS) ×2
BALLN ~~LOC~~ EMERGE MR 3.75X20 (BALLOONS) ×2
BALLOON SAPPHIRE 3.0X12 (BALLOONS) ×1 IMPLANT
BALLOON ~~LOC~~ EMERGE MR 3.75X20 (BALLOONS) ×1 IMPLANT
CATH INFINITI 5 FR JL3.5 (CATHETERS) ×2 IMPLANT
CATH VISTA GUIDE 6FR JR4 (CATHETERS) ×2 IMPLANT
DEVICE RAD COMP TR BAND LRG (VASCULAR PRODUCTS) ×2 IMPLANT
GLIDESHEATH SLEND A-KIT 6F 22G (SHEATH) ×2 IMPLANT
KIT ENCORE 26 ADVANTAGE (KITS) ×2 IMPLANT
KIT HEART LEFT (KITS) ×2 IMPLANT
PACK CARDIAC CATHETERIZATION (CUSTOM PROCEDURE TRAY) ×2 IMPLANT
STENT ONYX FRONTIER 3.5X18 (Permanent Stent) ×2 IMPLANT
TRANSDUCER W/STOPCOCK (MISCELLANEOUS) ×2 IMPLANT
TUBING CIL FLEX 10 FLL-RA (TUBING) ×2 IMPLANT
WIRE ASAHI PROWATER 180CM (WIRE) ×2 IMPLANT
WIRE HI TORQ VERSACORE J 260CM (WIRE) ×2 IMPLANT

## 2020-07-20 NOTE — H&P (Addendum)
The patient has been seen in conjunction with Dr. Leodis Sias. All aspects of care have been considered and discussed. The patient has been personally interviewed, examined, and all clinical data has been reviewed.  The patient was met in the ambulance bay.  He took in an order note amount of time to arrive by ambulance because of bilateral rear flat tires that occurred and required switching vehicles to transport the patient. Upon arriving the patient complains of 9 out of 10 severe central chest discomfort ongoing for approximately 2 hours prior to arrival.  He has a history of heart disease with prior right coronary artery stents.  He notes a history of diabetes mellitus, hypertension, and prior smoking.  His wife has dementia. During transportation the patient developed hypotension after sublingual nitroglycerin.  Nitroglycerin did not relieve the chest discomfort.  He does complain of shortness of breath and is on an oxygen cannula.  O2 sats in route were greater than 95%. The encoded EKG at 9:37 PM suggested hyperacute inferolateral and anteroapical ST elevation with reciprocal changes in lead I and aVL. Initial vital signs taken reveal blood pressures above 120/70 mmHg with heart rate of 68.  He appeared pale with moist skin to touch.  Neck veins were not distended.  Cardiac tones are distant.  No obvious murmurs heard.  Posterior mid and basal lung fields reveal slight crackles.  Bilateral radial and posterior tibial pulses are 2+ and symmetric. IMPRESSION: Initial assessment is consistent with acute inferior ST elevation MI.  Possible stent thrombosis versus de novo proximal lesion.  The patient is critically ill and having ongoing severe chest discomfort although hemodynamics are reasonable.  RECOMMENDATIONS: The patient is taken directly to the Cath Lab from the emergency room where he will undergo mechanical revascularization.  Attention will be paid first to the right coronary which  is the likely culprit.  IV fluids in the setting of acute inferior MI will be given to support blood pressure.   CRITICAL CARE time: 35 minutes      Cardiology Admission History and Physical:   Patient ID: Gregory Cole MRN: [id number]; DOB: 1950/08/09   Admission date: 07/20/2020  PCP:  Loman Brooklyn, [facility] Providers Cardiologist:  Shelva Majestic, MD  Cardiology APP:  Almyra Deforest, [place]       Chief Complaint:  CP/STEMI  Patient Profile:   Gregory Cole is a 70 y.o. male with 70 y.o. male with a hx of CAD, HTN, HLD and DM II. He had PCI x3 in 2010 who is being seen 07/20/2020 for the evaluation of CP/STEMI.  History of Present Illness:   [name] has a hx of CAD, HTN, HLD and DM II. He had PCI x3 in 2010 and was admitted to [facility] in March 2021 w/ NSTEMI. High-sensitivity troponin peaked at 1079.  TTE performed on 04/06/2019 showed EF 50%, akinetic and aneurysmal LV apex with no LV thrombus.  Cardiac catheterization performed on 04/07/2019 showed multivessel disease with 35% proximal RCA, 70% followed by 50% RPDA lesion, 100% mid to distal LAD occlusion, 20% proximal and mid LAD, 80% mid RCA lesion treated with DES, widely patent ramus stent.  Since the distal LAD was being fed by faint bridging collaterals from the RCA, stenting of the mid RCA lesion resulted in improved collateralization to the distal LAD. It sounds like he has been doing fairly well up until about 1-2h prior to presentation, at which time he had onset of  significant midsternal pain/pressure associated with nausea and diaphoresis, similar to prior angina. EMS was called, and pt was found to have ST elevation in the inferior leads on EKG. STEMI activated. From what patient states, he is no longer on brilinta due to cost. It does not sound like anything was given to substitute, although cardiology notes do mention possibly starting effient to cont the DAPT past the 12 mos post-PCI.   Past Medical  History:  Diagnosis Date   Coronary artery disease    Diabetes mellitus without complication (HCC)    GERD (gastroesophageal reflux disease)    Glaucoma    Hypercholesteremia    Hypertension    Lesion of lung    [facility]   Myocardial infarction ([place])    x3   Postablative hypothyroidism 03/18/2020   Sleep apnea    unable to tolerate CPAP    Past Surgical History:  Procedure Laterality Date   BOWEL RESECTION     CARDIAC CATHETERIZATION     2 stents in heart   CATARACT EXTRACTION W/PHACO Left 07/01/2013   Procedure: CATARACT EXTRACTION PHACO AND INTRAOCULAR LENS PLACEMENT ([place]);  Surgeon: Elta Guadeloupe T. Gershon Crane, MD;  Location: [facility];  Service: Ophthalmology;  Laterality: Left;  CDE 6.84   CORONARY STENT INTERVENTION N/A 04/07/2019   Procedure: CORONARY STENT INTERVENTION;  Surgeon: Troy Sine, MD;  Location: [facility];  Service: Cardiovascular;  Laterality: N/A;   HAND SURGERY Left    LEFT HEART CATH AND CORONARY ANGIOGRAPHY N/A 04/07/2019   Procedure: LEFT HEART CATH AND CORONARY ANGIOGRAPHY;  Surgeon: Troy Sine, MD;  Location: [facility];  Service: Cardiovascular;  Laterality: N/A;   NASAL SEPTUM SURGERY     SHOULDER ARTHROSCOPY Right      Medications Prior to Admission: Prior to Admission medications   Medication Sig Start Date End Date Taking? Authorizing Provider  albuterol (VENTOLIN HFA) 108 (90 Base) MCG/ACT inhaler Inhale 2 puffs into the lungs every 6 (six) hours as needed for wheezing or shortness of breath.    [provider]  amLODipine (NORVASC) 2.5 MG tablet TAKE 1 TABLET BY MOUTH EVERY DAY 06/16/19   Almyra Deforest, PA  aspirin EC 81 MG tablet Take 81 mg by mouth daily. Evening 1600    [provider]  atorvastatin (LIPITOR) 80 MG tablet Take 1 tablet (80 mg total) by mouth daily at 6 PM. 04/08/19 05/08/19  Donne Hazel, MD  diclofenac Sodium (VOLTAREN) 1 % GEL Apply 2 g topically 4 (four) times daily. 03/18/20    Loman Brooklyn, FNP  insulin aspart protamine - aspart (NOVOLOG MIX 70/30 FLEXPEN) (70-30) 100 UNIT/ML FlexPen Inject 36 Units into the skin daily. Before dinner.    [provider]  isosorbide mononitrate (IMDUR) 120 MG 24 hr tablet Take 120 mg by mouth daily.    [provider]  levothyroxine (SYNTHROID, LEVOTHROID) 88 MCG tablet Take 88 mcg by mouth daily before breakfast.    [provider]  losartan (COZAAR) 100 MG tablet Take 100 mg by mouth daily. Evening 1600    [provider]  metFORMIN (GLUCOPHAGE) 500 MG tablet Take 500 mg by mouth 2 (two) times daily.    [provider]  metoprolol tartrate (LOPRESSOR) 25 MG tablet Take 1 tablet (25 mg total) by mouth 2 (two) times daily. 04/08/19 05/08/19  Donne Hazel, MD  nitroGLYCERIN (NITROSTAT) 0.4 MG SL tablet Place under the tongue.    [provider]  Omega-3 Fatty Acids (FISH OIL PO) Take 1,000 mg by mouth 2 (two) times daily.    [provider]  omeprazole (PRILOSEC) 20 MG capsule Take 20 mg by mouth daily. Evening 1600    [provider]  pioglitazone (ACTOS) 45 MG tablet Take 45 mg by mouth daily. Evening 1600    [provider]  ticagrelor (BRILINTA) 90 MG TABS tablet Take 1 tablet (90 mg total) by mouth 2 (two) times daily. 04/22/19   Almyra Deforest, PA     Allergies:   No Known Allergies  Social History:   Social History   Socioeconomic History   Marital status: Married    Spouse name: Not on file   Number of children: 1   Years of education: Not on file   Highest education level: Not on file  Occupational History   Not on file  Tobacco Use   Smoking status: Former    Packs/day: 2.00    Years: 40.00    Pack years: 80.00    Types: Cigarettes   Smokeless tobacco: Never  Vaping Use   Vaping Use: Never used  Substance and Sexual Activity   Alcohol use: Yes    Comment: occasional glass of wine   Drug use: No   Sexual activity: Not Currently   Other Topics Concern   Not on file  Social History Narrative   Lives with his wife - first floor of apartment complex   Social Determinants of Health   Financial Resource Strain: Low Risk    Difficulty of Paying Living Expenses: Not hard at all  Food Insecurity: No Food Insecurity   Worried About Charity fundraiser in the Last Year: Never true   [place] in the Last Year: Never true  Transportation Needs: No Transportation Needs   Lack of Transportation (Medical): No   Lack of Transportation (Non-Medical): No  Physical Activity: Sufficiently Active   Days of Exercise per Week: 7 days   Minutes of Exercise per Session: 30 min  Stress: No Stress Concern Present   Feeling of Stress : Not at all  Social Connections: Moderately Integrated   Frequency of Communication with Friends and Family: Once a week   Frequency of Social Gatherings with Friends and Family: Once a week   Attends Religious Services: More than 4 times per year   Active Member of [employer] or Organizations: Yes   Attends Music therapist: More than 4 times per year   Marital Status: Married  Human resources officer Violence: Not At Risk   Fear of Current or Ex-Partner: No   Emotionally Abused: No   Physically Abused: No   Sexually Abused: No    Family History:   The patient's family history includes Heart disease in his father; Stroke in his mother.    ROS:  Please see the history of present illness.  All other ROS reviewed and negative.     Physical Exam/Data:   Vitals:   07/20/20 2241  SpO2: 100%   No intake or output data in the 24 hours ending 07/20/20 2250 Last 3 Weights 05/28/2020 03/18/2020 08/21/2019  Weight (lbs) 182 lb 182 lb 3.2 oz 190 lb 3.2 oz  Weight (kg) 82.555 kg 82.645 kg 86.274 kg     There is no height or weight on file to calculate BMI.  General:  Well nourished, well developed; diaphoretic, appears in mild distress HEENT: normal Lymph: no adenopathy Neck: no  JVD Endocrine:  No thryomegaly Vascular: No carotid bruits; DP pulses 2+ bilaterally   Cardiac:  normal S1, S2; RRR; no murmur  Lungs:  clear to auscultation bilaterally, no wheezing, rhonchi or rales  Abd: soft, nontender, no hepatomegaly  Ext: no edema Musculoskeletal:  No deformities Skin: warm and dry  Neuro:  no focal abnormalities noted Psych:  Normal affect    EKG:  The ECG that was done 07-20-20 was personally reviewed and demonstrates NSR with ST elevation inferior, reciprocal ST depression  Relevant CV Studies:  04-06-20 LHC Prox RCA lesion is 35% stenosed. RPDA-1 lesion is 70% stenosed. RPDA-2 lesion is 50% stenosed. Mid LAD to Dist LAD lesion is 100% stenosed. Prox LAD lesion is 20% stenosed. Mid LAD lesion is 20% stenosed. Previously placed Ramus stent (unknown type) is widely patent. 2nd RPL lesion is 50% stenosed. Mid RCA lesion is 80% stenosed. Post intervention, there is a 0% residual stenosis. A stent was successfully placed.   Multivessel CAD with previously noted old total occlusion of the mid LAD with mild proximal to mid narrowing of 20 to 30% and evidence for faint bridging collaterals from septal perforating; widely patent stent to the proximal ramus intermediate vessel; normal left circumflex coronary artery; large dominant RCA with mild to moderate diffuse irregularity with narrowing of 30% stenoses proximally, diffuse 80% eccentric stenosis in the midportion of the vessel between the RV marginal branch and acute margin, 70% focal ostial PDA stenosis followed by 50% mid stenosis, 50% stenosis in inferior LV branch.   Low normal LV function with with EF estimated 50% with mid inferior hypocontractility and suggestion of possible distal apical hypocontractility.  LVEDP 9 mm.   Successful PCI to the dominant RCA with ultimate insertion of a 3.5 x 22 mm Resolute Onyx stent postdilated to 3.6 mm with the stenosis being reduced to 0% and brisk TIMI-3 flow.   Following successful reperfusion there was improved collateralization to the distal LAD via the distal RCA branches.  There was a ery small caliber marginal branch that arose from the mid RCA in the region of the stented segment which had 95% stenosis prior to intervention and was unchanged with TIMI-3 flow following stenting.    TTE 04-06-19 1. 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 Echo contrast used, there is  no thrombus present within the apical aneurysm. Marland Kitchen Left ventricular  ejection fraction, by estimation, is 50%. The left ventricle has low  normal function. The left ventricle  demonstrates regional wall motion abnormalities (see scoring  diagram/findings for description). There is mild left ventricular  hypertrophy. Left ventricular diastolic parameters are consistent with  Grade I diastolic dysfunction (impaired relaxation).  Elevated left atrial pressure.   2. Right ventricular systolic function is normal. The right ventricular  size is normal.   3. Left atrial size was mildly dilated.   4. The mitral valve is normal in structure. No evidence of mitral valve  regurgitation. No evidence of mitral stenosis.   5. The aortic valve is tricuspid. Aortic valve regurgitation is not  visualized. No aortic stenosis is present.   Laboratory Data:  High Sensitivity Troponin:  No results for input(s): TROPONINIHS in the last 720 hours.    ChemistryNo results for input(s): NA, K, CL, CO2, GLUCOSE, BUN, CREATININE, CALCIUM, GFRNONAA, GFRAA, ANIONGAP in the last 168 hours.  No results for input(s): PROT, ALBUMIN, AST, ALT, ALKPHOS, BILITOT in the last 168 hours. HematologyNo results for input(s): WBC, RBC, HGB, HCT, MCV, MCH, MCHC, RDW, PLT in the  last 168 hours. BNPNo results for input(s): BNP, PROBNP in the last 168 hours.  DDimer No results for input(s): DDIMER in the last 168 hours.   Radiology/Studies:  No results found.   Assessment and Plan:   CP/CAD/STEMI: urgent/emergent LHC.  Will get A1c, TSH, FLP for risk stratification. TTE in AM. Restart DAPT post-cath  HTN/dyslipidemia/DM2: restart home regimen and adjust PRN Thyroid d/o: checking TSH as above. Cont home thyroid supplementation for now.   Risk Assessment/Risk Scores:    TIMI Risk Score for ST  Elevation MI:   The patient's TIMI risk score is 3, which indicates a 4.4% risk of all cause mortality at 30 days.        Severity of Illness: The appropriate patient status for this patient is INPATIENT. Inpatient status is judged to be reasonable and necessary in order to provide the required intensity of service to ensure the patient's safety. The patient's presenting symptoms, physical exam findings, and initial radiographic and laboratory data in the context of their chronic comorbidities is felt to place them at high risk for further clinical deterioration. Furthermore, it is not anticipated that the patient will be medically stable for discharge from the hospital within 2 midnights of admission. The following factors support the patient status of inpatient.   " The patient's presenting symptoms include CP. " The worrisome physical exam findings include diaphoresis. " The initial radiographic and laboratory data are worrisome because of ST elevation on EKG. " The chronic co-morbidities include HTN, DM, dyslipidemia.   * I certify that at the point of admission it is my clinical judgment that the patient will require inpatient hospital care spanning beyond 2 midnights from the point of admission due to high intensity of service, high risk for further deterioration and high frequency of surveillance required.*   For questions or updates, please contact [place] Please consult [email or web address] for contact info under     Signed, Rudean Curt, MD, [facility] 07/20/2020 10:50 PM

## 2020-07-20 NOTE — Progress Notes (Signed)
   07/20/20 2200  Clinical Encounter Type  Visited With Patient not available  Visit Type Trauma  Referral From Nurse  Consult/Referral To Chaplain  The chaplain responded to STEMI page. The patient is being transported directly to the CATH Lab. No family present. No chaplain services are needed at this time. The chaplain will follow up as needed

## 2020-07-21 ENCOUNTER — Inpatient Hospital Stay (HOSPITAL_COMMUNITY): Payer: Medicare PPO

## 2020-07-21 ENCOUNTER — Other Ambulatory Visit (HOSPITAL_COMMUNITY): Payer: Self-pay

## 2020-07-21 ENCOUNTER — Other Ambulatory Visit: Payer: Self-pay

## 2020-07-21 ENCOUNTER — Encounter (HOSPITAL_COMMUNITY): Payer: Self-pay | Admitting: Interventional Cardiology

## 2020-07-21 DIAGNOSIS — I2119 ST elevation (STEMI) myocardial infarction involving other coronary artery of inferior wall: Secondary | ICD-10-CM | POA: Diagnosis not present

## 2020-07-21 DIAGNOSIS — E118 Type 2 diabetes mellitus with unspecified complications: Secondary | ICD-10-CM

## 2020-07-21 DIAGNOSIS — I5021 Acute systolic (congestive) heart failure: Secondary | ICD-10-CM | POA: Diagnosis not present

## 2020-07-21 DIAGNOSIS — Z955 Presence of coronary angioplasty implant and graft: Secondary | ICD-10-CM

## 2020-07-21 DIAGNOSIS — I25110 Atherosclerotic heart disease of native coronary artery with unstable angina pectoris: Secondary | ICD-10-CM

## 2020-07-21 DIAGNOSIS — I255 Ischemic cardiomyopathy: Secondary | ICD-10-CM

## 2020-07-21 DIAGNOSIS — Z720 Tobacco use: Secondary | ICD-10-CM

## 2020-07-21 DIAGNOSIS — I10 Essential (primary) hypertension: Secondary | ICD-10-CM | POA: Diagnosis not present

## 2020-07-21 DIAGNOSIS — E785 Hyperlipidemia, unspecified: Secondary | ICD-10-CM

## 2020-07-21 DIAGNOSIS — E1169 Type 2 diabetes mellitus with other specified complication: Secondary | ICD-10-CM

## 2020-07-21 HISTORY — PX: TRANSTHORACIC ECHOCARDIOGRAM: SHX275

## 2020-07-21 LAB — ECHOCARDIOGRAM COMPLETE
Area-P 1/2: 2.37 cm2
Height: 67 in
S' Lateral: 4 cm
Weight: 2998.26 oz

## 2020-07-21 LAB — CBC
HCT: 39.4 % (ref 39.0–52.0)
Hemoglobin: 12.5 g/dL — ABNORMAL LOW (ref 13.0–17.0)
MCH: 28.6 pg (ref 26.0–34.0)
MCHC: 31.7 g/dL (ref 30.0–36.0)
MCV: 90.2 fL (ref 80.0–100.0)
Platelets: 180 10*3/uL (ref 150–400)
RBC: 4.37 MIL/uL (ref 4.22–5.81)
RDW: 15.1 % (ref 11.5–15.5)
WBC: 10.2 10*3/uL (ref 4.0–10.5)
nRBC: 0 % (ref 0.0–0.2)

## 2020-07-21 LAB — LIPID PANEL
Cholesterol: 108 mg/dL (ref 0–200)
HDL: 30 mg/dL — ABNORMAL LOW (ref >40)
LDL Cholesterol: 62 mg/dL (ref 0–99)
Total CHOL/HDL Ratio: 3.6 RATIO
Triglycerides: 81 mg/dL (ref <150)
VLDL: 16 mg/dL (ref 0–40)

## 2020-07-21 LAB — GLUCOSE, CAPILLARY
Glucose-Capillary: 123 mg/dL — ABNORMAL HIGH (ref 70–99)
Glucose-Capillary: 123 mg/dL — ABNORMAL HIGH (ref 70–99)
Glucose-Capillary: 71 mg/dL (ref 70–99)
Glucose-Capillary: 108 mg/dL — ABNORMAL HIGH (ref 70–99)

## 2020-07-21 LAB — BASIC METABOLIC PANEL
Anion gap: 7 (ref 5–15)
BUN: 20 mg/dL (ref 8–23)
CO2: 20 mmol/L — ABNORMAL LOW (ref 22–32)
Calcium: 8.6 mg/dL — ABNORMAL LOW (ref 8.9–10.3)
Chloride: 113 mmol/L — ABNORMAL HIGH (ref 98–111)
Creatinine, Ser: 1.16 mg/dL (ref 0.61–1.24)
GFR, Estimated: >60 mL/min (ref >60)
Glucose, Bld: 126 mg/dL — ABNORMAL HIGH (ref 70–99)
Potassium: 4.2 mmol/L (ref 3.5–5.1)
Sodium: 140 mmol/L (ref 135–145)

## 2020-07-21 LAB — PROTIME-INR
INR: 1.1 (ref 0.8–1.2)
Prothrombin Time: 14 seconds (ref 11.4–15.2)

## 2020-07-21 LAB — TROPONIN I (HIGH SENSITIVITY)
Troponin I (High Sensitivity): >24000 ng/L (ref <18)
Troponin I (High Sensitivity): >24000 ng/L (ref <18)

## 2020-07-21 LAB — MRSA NEXT GEN BY PCR, NASAL: MRSA by PCR Next Gen: NOT DETECTED

## 2020-07-21 LAB — TSH: TSH: 1.003 u[IU]/mL (ref 0.350–4.500)

## 2020-07-21 MED ORDER — DAPAGLIFLOZIN PROPANEDIOL 10 MG PO TABS: 10.0000 mg | ORAL_TABLET | Freq: Every day | ORAL | Status: DC

## 2020-07-21 MED ORDER — PERFLUTREN LIPID MICROSPHERE
1.0000 mL | INTRAVENOUS | Status: AC | PRN
  Administered 2020-07-21: 10 mL via INTRAVENOUS
  Filled 2020-07-21: qty 10

## 2020-07-21 MED ORDER — EMPAGLIFLOZIN 10 MG PO TABS
10.0000 mg | ORAL_TABLET | Freq: Every day | ORAL | Status: DC
  Administered 2020-07-21: 10 mg via ORAL
  Filled 2020-07-21 (×2): qty 1

## 2020-07-21 MED ORDER — FUROSEMIDE 10 MG/ML IJ SOLN
20.0000 mg | Freq: Once | INTRAMUSCULAR | Status: AC
  Administered 2020-07-21: 20 mg via INTRAVENOUS
  Filled 2020-07-21: qty 2

## 2020-07-21 MED ORDER — WARFARIN - PHARMACIST DOSING INPATIENT: Freq: Every day | Status: DC

## 2020-07-21 MED ORDER — ENOXAPARIN SODIUM 100 MG/ML IJ SOSY
1.0000 mg/kg | PREFILLED_SYRINGE | Freq: Two times a day (BID) | INTRAMUSCULAR | Status: DC
  Administered 2020-07-21 – 2020-07-22 (×4): 85 mg via SUBCUTANEOUS
  Filled 2020-07-21: qty 0.85
  Filled 2020-07-21: qty 1
  Filled 2020-07-21: qty 0.85
  Filled 2020-07-21 (×2): qty 1

## 2020-07-21 MED ORDER — CLOPIDOGREL BISULFATE 75 MG PO TABS
75.0000 mg | ORAL_TABLET | Freq: Every day | ORAL | Status: DC
  Administered 2020-07-23: 75 mg via ORAL
  Filled 2020-07-21: qty 1

## 2020-07-21 MED ORDER — HEART ATTACK BOUNCING BOOK
Freq: Once | Status: AC
  Filled 2020-07-21: qty 1

## 2020-07-21 MED ORDER — CLOPIDOGREL BISULFATE 75 MG PO TABS
300.0000 mg | ORAL_TABLET | Freq: Once | ORAL | Status: AC
  Administered 2020-07-22: 300 mg via ORAL
  Filled 2020-07-21: qty 4

## 2020-07-21 MED ORDER — WARFARIN SODIUM 5 MG PO TABS
5.0000 mg | ORAL_TABLET | Freq: Once | ORAL | Status: AC
  Administered 2020-07-21: 5 mg via ORAL
  Filled 2020-07-21: qty 1

## 2020-07-21 MED ORDER — ANGIOPLASTY BOOK
Freq: Once | Status: AC
  Filled 2020-07-21: qty 1

## 2020-07-21 MED FILL — Nitroglycerin IV Soln 100 MCG/ML in D5W: INTRA_ARTERIAL | Qty: 10 | Status: AC

## 2020-07-21 NOTE — Progress Notes (Addendum)
Progress Note  Patient Name: Gregory Cole Date of Encounter: 07/21/2020  [facility] Cardiologist: Nicki Guadalajara, MD  Patient Profile     70 y.o. male with history of known CAD (PCI x3 in 2010--NSTEMI March 2021 diffuse mid to distal LAD occlusion, 80% mid RCA-PCI to RCA; EF 50%)), HTN, HLD, DM-2 who presented evening of 07/20/2020 with Inferior ST elevation MI.  Apparently patient had stopped taking Brilinta due to cost issues.  Subjective   Feels a lot better today than he did last night. Says that his symptoms began last night shortly after getting home from going out to eat where the food was "not good ".  He had a significant amount of nausea followed by vomiting that preceded his chest tightness pressure and dyspnea.  He became diaphoretic after that and called 911-> less than 1 hour from onset of symptoms.   Assessment & Plan    Principal Problem:   Acute ST elevation myocardial infarction (STEMI) of inferior wall involving right ventricle (HCC) Active Problems:   Coronary artery disease involving native heart with unstable angina pectoris (HCC)   Ischemic cardiomyopathy -> in setting of inferior STEMI, LV gram EF of 35 to 40% inferior wall hypokinesis.  LVEDP 26.   Presence of drug coated stent in right coronary artery -> second overlapping DES stent proximal to previous stent   Type 2 diabetes mellitus with complication, without long-term current use of insulin (HCC)   Hyperlipidemia associated with type 2 diabetes mellitus (HCC)   Essential hypertension   Tobacco use  Principal Problem:   Acute ST elevation myocardial infarction (STEMI) of inferior wall involving right ventricle (HCC)  / Coronary artery disease involving native heart with unstable angina pectoris (HCC) / Presence of drug coated stent in right coronary artery -> second overlapping DES stent proximal to previous stent Back on DAPT-we will need this to minimum 1 year, but now with overlapping stents,  would probably continue beyond 1 year.  If Brilinta becomes an issue from a dyspnea standpoint, would convert to Effient versus Plavix. High-dose statin, beta-blocker and ARB along with Imdur.  (Okay to hold Imdur for now-depending on symptoms may or may not restart.) Also on low-dose amlodipine for antianginal benefit,  Which may need to be sacrificed in order to convert to [facility]. ->  DC amlodipine after dose today.  Active Problems:    Ischemic cardiomyopathy -> in setting of inferior STEMI, LV gram EF of 35 to 40% inferior wall hypokinesis.  LVEDP 26.  Clinically no signs of acute CHF, however faint rales on exam. No real JVD, but mild rales on exam, will give 1 dose of IV Lasix today and reassess. With reduced EF, will consider at a minimum spironolactone versus possible standing diuretic with sliding scale. If EF truly is down by echo with likely DC amlodipine, restart Imdur and convert from losartan to Entresto.     Type 2 diabetes mellitus with complication, without long-term current use of insulin (HCC) /  Hyperlipidemia associated with type 2 diabetes mellitus (HCC) --(current LDL is 62 had been 68 in March) On 80 mg atorvastatin and omega-3 fatty acids-appears to be well controlled;  A1c roughly 7 in March.  On sliding scale insulin.  Is on pioglitazone-we will likely want to DC  and consider SGLT2 inhibitor (discussed with pharmacy-Jardiance seems to be what is on the [place] pharmacy).     Essential hypertension blood pressure relatively stable currently. Currently on amlodipine, losartan high-dose and metoprolol tartrate  We will plan to convert from metoprolol tartrate to metoprolol succinate versus carvedilol prior to discharge.     Tobacco use Smoking cessation counseling provided.   Dispo: Ambulate in hallway today with cardiac rehab.  If stable will transfer to telemetry floor   ADDENDUM: Was contacted by echo reading physician.  Cannot exclude LV apical thrombus in the  setting of known LV apical aneurysm.  -> This means we needed initiate oral anticoagulation: ->   Start enoxaparin treatment dose along with warfarin; DC Brilinta and convert to Plavix starting tomorrow-300 mg load then 75 mg daily Will definitely convert from losartan to Entresto in the morning  -------------------------------------------------------  Inpatient Medications    Scheduled Meds:  amLODipine  2.5 mg Oral Daily   aspirin EC  81 mg Oral Q24H   atorvastatin  80 mg Oral q1800   Chlorhexidine Gluconate Cloth  6 each Topical Daily   heparin  5,000 Units Subcutaneous Q8H   insulin aspart  0-15 Units Subcutaneous TID WC   insulin aspart  0-5 Units Subcutaneous QHS   insulin aspart protamine- aspart  36 Units Subcutaneous Daily   isosorbide mononitrate  120 mg Oral Daily   levothyroxine  88 mcg Oral Q0600   losartan  100 mg Oral Daily   mouth rinse  15 mL Mouth Rinse BID   metoprolol tartrate  25 mg Oral BID   omega-3 acid ethyl esters  1 g Oral BID   pantoprazole  40 mg Oral Daily   pioglitazone  45 mg Oral Daily   sodium chloride flush  3 mL Intravenous Q12H   ticagrelor  90 mg Oral BID   Continuous Infusions:  sodium chloride     PRN Meds: sodium chloride, acetaminophen, nitroGLYCERIN, ondansetron (ZOFRAN) IV, oxyCODONE, sodium chloride flush   Vital Signs    Vitals:   07/21/20 0530 07/21/20 0600 07/21/20 0700 07/21/20 0827  BP: 122/79 122/79  117/70  Pulse: 61 62 71 68  Resp: (!) 9 12 16    Temp:   (!) 97.5 F (36.4 C)   TempSrc:   Oral   SpO2: 100% 100% 100%   Weight:      Height:        Intake/Output Summary (Last 24 hours) at 07/21/2020 0916 Last data filed at 07/21/2020 0600 Gross per 24 hour  Intake 925 ml  Output 500 ml  Net 425 ml   Last 3 Weights 07/20/2020 07/20/2020 05/28/2020  Weight (lbs) 187 lb 6.3 oz 182 lb 5.1 oz 182 lb  Weight (kg) 85 kg 82.7 kg 82.555 kg      Telemetry    Sinus rhythm with PVCs - Personally Reviewed  ECG    NSR  rate 65 bpm.  Left axis deviation (-37).  Septal infarct, age-indeterminate, inferior infarct, age-indeterminate with T wave inversions in III & avF with biphasic ST and T waves in II (possible evolutionary changes of inferior STEMI- Personally Reviewed  Physical Exam   GEN: Resting comfortably in bed, no acute distress.   Neck: No JVD or bruit. Cardiac: RRR; normal S1/S2., no murmurs, rubs, or gallops.  Respiratory: Clear to auscultation bilaterally with exception of mild left lower lung field rales.  Nonlabored, good air movement.  No W/R/R. GI: Soft, nontender, non-distended  MS: No edema; No deformity. Neuro:  Nonfocal  Psych: Normal affect   Labs    High Sensitivity Troponin:   Recent Labs  Lab 07/21/20 0437 07/21/20 0711  TROPONINIHS >24,000* >24,000*      Chemistry  Recent Labs  Lab 07/21/20 0437  NA 140  K 4.2  CL 113*  CO2 20*  GLUCOSE 126*  BUN 20  CREATININE 1.16  CALCIUM 8.6*  GFRNONAA >60  ANIONGAP 7    Lab Results  Component Value Date   CHOL 108 07/21/2020   HDL 30 (L) 07/21/2020   LDLCALC 62 07/21/2020   TRIG 81 07/21/2020   CHOLHDL 3.6 07/21/2020   Lab Results  Component Value Date   HGBA1C 7.1 (H) 03/18/2020    HematologyNo results for input(s): WBC, RBC, HGB, HCT, MCV, MCH, MCHC, RDW, PLT in the last 168 hours.  BNPNo results for input(s): BNP, PROBNP in the last 168 hours.   DDimer No results for input(s): DDIMER in the last 168 hours.   Radiology / Cardiology Studies    TTE 04/06/2019: EF 50%.  Akinetic/aneurysmal apex.  Mild LA dilation.  Normal RV size and function.  CARDIAC CATHETERIZATION  Result Date: 07/20/2020  Acute inferior ST elevation MI secondary to plaque rupture with acute occlusion.  Total occlusion of the proximal RCA is reduced to 0% with TIMI grade III flow after stenting with an 18 x 3.5 mm stent postdilated to 3.75 mm.  The stent slightly overlaps the stent already present in the mid right coronary.  Widely  patent left main  LAD is totally occluded in the mid to distal segment with left to right and right to left collaterals that are faint.  Proximal to mid LAD contain moderate atherosclerosis and calcification.  Large ramus branch is widely patent including the proximal to mid stent.  Circumflex gives origin to 3 obtuse marginal branches and has mild generalized atherosclerosis but no obstructive disease.  Left ventriculography demonstrates apical dyskinesis and possible LV thrombus.  Inferior wall and basal segment are hypokinetic.  EF 35 to 40%.  LVEDP 26 mmHg after ventriculography.  13 to 15 mmHg before ventriculography. RECOMMENDATIONS:  Complete 1 year of dual antiplatelet therapy.  Aggressive risk factor modification.  Further management per treating team  Diagnostic      Intervention      Post MI TTE pending  -------------------------------------------------------------------------  For questions or updates, please contact [facility] Please consult [email or web address] for contact info under        Signed, Bryan Lemma, MD  07/21/2020, 9:16 AM

## 2020-07-21 NOTE — Progress Notes (Signed)
Echocardiogram 2D Echocardiogram has been performed.  Warren Lacy Miyo Aina RDCS 07/21/2020, 9:19 AM  Notified Dr. Shirlee Latch at 9:20 to look at images.

## 2020-07-21 NOTE — Progress Notes (Signed)
Pt ambulated earlier this morning with RN and now with possible clot. Assisted pt to recliner. Discussed MI, stent, Plavix/coumadin. He is upset about the possible thrombus. Also upset as his wife has dementia and gets lost without him. Left materials and will f/u tomorrow. Assisted wife in getting to [place] for pickup. [id number] Ethelda Chick CES, ACSM 2:25 PM 07/21/2020

## 2020-07-21 NOTE — Progress Notes (Signed)
ANTICOAGULATION CONSULT NOTE - Initial Consult  Pharmacy Consult for warfarin Indication: LV thrombus  No Known Allergies  Patient Measurements: Height: 5\' 7"  (170.2 cm) Weight: 85 kg (187 lb 6.3 oz) IBW/kg (Calculated) : 66.1  Vital Signs: Temp: 96.7 F (35.9 C) (07/13 1100) Temp Source: Oral (07/13 0700) BP: 115/70 (07/13 1300) Pulse Rate: 56 (07/13 1300)  Labs: Recent Labs    07/21/20 0437 07/21/20 0711  CREATININE 1.16  --   TROPONINIHS >24,000* >24,000*    Estimated Creatinine Clearance: 60.9 mL/min (by C-G formula based on SCr of 1.16 mg/dL).   Medical History: Past Medical History:  Diagnosis Date   Coronary artery disease    Diabetes mellitus without complication (HCC)    GERD (gastroesophageal reflux disease)    Glaucoma    Hypercholesteremia    Hypertension    Lesion of lung    [facility] reported - [facility]   Myocardial infarction (HCC)    x3   Postablative hypothyroidism 03/18/2020   Sleep apnea    unable to tolerate CPAP    Medications:  Medications Prior to Admission  Medication Sig Dispense Refill Last Dose   amLODipine (NORVASC) 2.5 MG tablet TAKE 1 TABLET BY MOUTH EVERY DAY (Patient taking differently: Take 5 mg by mouth daily.) 90 tablet 0 07/20/2020   aspirin EC 81 MG tablet Take 81 mg by mouth daily. Evening 1600   07/20/2020   atorvastatin (LIPITOR) 80 MG tablet Take 1 tablet (80 mg total) by mouth daily at 6 PM. 30 tablet 0 07/19/2020   diclofenac Sodium (VOLTAREN) 1 % GEL Apply 2 g topically 4 (four) times daily. (Patient taking differently: Apply 4 g topically 4 (four) times daily as needed (knee pain).) 350 g 2 Past Week   guaiFENesin (ROBITUSSIN) 100 MG/5ML liquid Take 200 mg by mouth 3 (three) times daily as needed for cough.   Past Week   insulin aspart protamine - aspart (NOVOLOG MIX 70/30 FLEXPEN) (70-30) 100 UNIT/ML FlexPen Inject 36 Units into the skin daily. Before dinner.   07/19/2020   isosorbide mononitrate (IMDUR) 120 MG 24  hr tablet Take 120 mg by mouth daily.   07/20/2020   levothyroxine (SYNTHROID, LEVOTHROID) 88 MCG tablet Take 88 mcg by mouth daily before breakfast.   07/20/2020   losartan (COZAAR) 100 MG tablet Take 100 mg by mouth daily. Evening 1600   07/20/2020   metFORMIN (GLUCOPHAGE) 850 MG tablet Take 850 mg by mouth 2 (two) times daily with a meal.   07/20/2020   metoprolol tartrate (LOPRESSOR) 25 MG tablet Take 1 tablet (25 mg total) by mouth 2 (two) times daily. 60 tablet 0 07/20/2020 at 0800   nitroGLYCERIN (NITROSTAT) 0.4 MG SL tablet Place under the tongue.   07/20/2020   Omega-3 Fatty Acids (FISH OIL PO) Take 1,000 mg by mouth 2 (two) times daily.   07/20/2020   omeprazole (PRILOSEC) 20 MG capsule Take 20 mg by mouth 2 (two) times daily before a meal. Evening 1600   07/20/2020   pioglitazone (ACTOS) 45 MG tablet Take 45 mg by mouth daily. Evening 1600   07/19/2020   Scheduled:   aspirin EC  81 mg Oral Q24H   atorvastatin  80 mg Oral q1800   Chlorhexidine Gluconate Cloth  6 each Topical Daily   [START ON 07/22/2020] clopidogrel  300 mg Oral Once   [START ON 07/23/2020] clopidogrel  75 mg Oral Daily   empagliflozin  10 mg Oral Daily   enoxaparin (LOVENOX) injection  1 mg/kg Subcutaneous BID   insulin aspart  0-15 Units Subcutaneous TID WC   insulin aspart  0-5 Units Subcutaneous QHS   insulin aspart protamine- aspart  36 Units Subcutaneous Daily   levothyroxine  88 mcg Oral Q0600   losartan  100 mg Oral Daily   mouth rinse  15 mL Mouth Rinse BID   metoprolol tartrate  25 mg Oral BID   omega-3 acid ethyl esters  1 g Oral BID   pantoprazole  40 mg Oral Daily   sodium chloride flush  3 mL Intravenous Q12H   ticagrelor  90 mg Oral BID    Assessment: 70 yo male s/p STEMI s/p cath with stent to RCA. He is also noted with an LV thrombus and pharmacy consulted to dose warfarin.  -INR= 1.1 -hg= 12.5  Goal of Therapy:  INR 2-3 Monitor platelets by anticoagulation protocol: Yes   Plan:  -Lovenox 85mg   [place] q12h -Warfarin 5mg  po today -Daily PT/INR -CBC every 3 days  , PharmD Clinical Pharmacist **Pharmacist phone directory can now be found on [email or web address] ([email or web address]).  Listed under [facility].

## 2020-07-21 NOTE — Plan of Care (Signed)
  Problem: Clinical Measurements: Goal: Will remain free from infection Outcome: Progressing   Problem: Activity: Goal: Risk for activity intolerance will decrease Outcome: Progressing   Problem: Pain Managment: Goal: General experience of comfort will improve Outcome: Progressing   Problem: Safety: Goal: Ability to remain free from injury will improve Outcome: Progressing   

## 2020-07-22 ENCOUNTER — Inpatient Hospital Stay (HOSPITAL_COMMUNITY): Payer: Medicare PPO

## 2020-07-22 ENCOUNTER — Other Ambulatory Visit (HOSPITAL_COMMUNITY): Payer: Self-pay

## 2020-07-22 ENCOUNTER — Encounter (HOSPITAL_COMMUNITY): Payer: Self-pay | Admitting: Interventional Cardiology

## 2020-07-22 DIAGNOSIS — I25110 Atherosclerotic heart disease of native coronary artery with unstable angina pectoris: Secondary | ICD-10-CM | POA: Diagnosis not present

## 2020-07-22 DIAGNOSIS — Z955 Presence of coronary angioplasty implant and graft: Secondary | ICD-10-CM | POA: Diagnosis not present

## 2020-07-22 DIAGNOSIS — I2119 ST elevation (STEMI) myocardial infarction involving other coronary artery of inferior wall: Secondary | ICD-10-CM | POA: Diagnosis not present

## 2020-07-22 DIAGNOSIS — I236 Thrombosis of atrium, auricular appendage, and ventricle as current complications following acute myocardial infarction: Secondary | ICD-10-CM

## 2020-07-22 DIAGNOSIS — I10 Essential (primary) hypertension: Secondary | ICD-10-CM | POA: Diagnosis not present

## 2020-07-22 DIAGNOSIS — I255 Ischemic cardiomyopathy: Secondary | ICD-10-CM

## 2020-07-22 LAB — COMPREHENSIVE METABOLIC PANEL
ALT: 33 U/L (ref 0–44)
AST: 145 U/L — ABNORMAL HIGH (ref 15–41)
Albumin: 3.2 g/dL — ABNORMAL LOW (ref 3.5–5.0)
Alkaline Phosphatase: 56 U/L (ref 38–126)
Anion gap: 8 (ref 5–15)
BUN: 20 mg/dL (ref 8–23)
CO2: 24 mmol/L (ref 22–32)
Calcium: 9.1 mg/dL (ref 8.9–10.3)
Chloride: 108 mmol/L (ref 98–111)
Creatinine, Ser: 1.56 mg/dL — ABNORMAL HIGH (ref 0.61–1.24)
GFR, Estimated: 47 mL/min — ABNORMAL LOW (ref >60)
Glucose, Bld: 100 mg/dL — ABNORMAL HIGH (ref 70–99)
Potassium: 4.1 mmol/L (ref 3.5–5.1)
Sodium: 140 mmol/L (ref 135–145)
Total Bilirubin: 0.5 mg/dL (ref 0.3–1.2)
Total Protein: 6.2 g/dL — ABNORMAL LOW (ref 6.5–8.1)

## 2020-07-22 LAB — CBC
HCT: 34.2 % — ABNORMAL LOW (ref 39.0–52.0)
Hemoglobin: 10.8 g/dL — ABNORMAL LOW (ref 13.0–17.0)
MCH: 28.1 pg (ref 26.0–34.0)
MCHC: 31.6 g/dL (ref 30.0–36.0)
MCV: 89.1 fL (ref 80.0–100.0)
Platelets: 164 10*3/uL (ref 150–400)
RBC: 3.84 MIL/uL — ABNORMAL LOW (ref 4.22–5.81)
RDW: 15.2 % (ref 11.5–15.5)
WBC: 9 10*3/uL (ref 4.0–10.5)
nRBC: 0 % (ref 0.0–0.2)

## 2020-07-22 LAB — GLUCOSE, CAPILLARY
Glucose-Capillary: 91 mg/dL (ref 70–99)
Glucose-Capillary: 105 mg/dL — ABNORMAL HIGH (ref 70–99)
Glucose-Capillary: 88 mg/dL (ref 70–99)
Glucose-Capillary: 200 mg/dL — ABNORMAL HIGH (ref 70–99)

## 2020-07-22 LAB — PROTIME-INR
INR: 1.2 (ref 0.8–1.2)
Prothrombin Time: 15 seconds (ref 11.4–15.2)

## 2020-07-22 LAB — HEMOGLOBIN A1C
Hgb A1c MFr Bld: 7.2 % — ABNORMAL HIGH (ref 4.8–5.6)
Mean Plasma Glucose: 159.94 mg/dL

## 2020-07-22 MED ORDER — CARVEDILOL 3.125 MG PO TABS
3.1250 mg | ORAL_TABLET | Freq: Two times a day (BID) | ORAL | Status: DC
  Administered 2020-07-22 – 2020-07-23 (×2): 3.125 mg via ORAL
  Filled 2020-07-22 (×2): qty 1

## 2020-07-22 MED ORDER — ENOXAPARIN SODIUM 80 MG/0.8ML IJ SOSY
80.0000 mg | PREFILLED_SYRINGE | Freq: Two times a day (BID) | INTRAMUSCULAR | 0 refills | Status: DC
  Filled 2020-07-22: qty 8, 5d supply, fill #0

## 2020-07-22 MED ORDER — GADOBUTROL 1 MMOL/ML IV SOLN
10.0000 mL | Freq: Once | INTRAVENOUS | Status: AC | PRN
  Administered 2020-07-22: 10 mL via INTRAVENOUS

## 2020-07-22 MED ORDER — DAPAGLIFLOZIN PROPANEDIOL 10 MG PO TABS
10.0000 mg | ORAL_TABLET | Freq: Every day | ORAL | Status: DC
  Administered 2020-07-23: 10 mg via ORAL
  Filled 2020-07-22: qty 1

## 2020-07-22 MED ORDER — ENOXAPARIN (LOVENOX) PATIENT EDUCATION KIT
PACK | Freq: Once | Status: AC
  Filled 2020-07-22: qty 1

## 2020-07-22 MED ORDER — WARFARIN SODIUM 5 MG PO TABS
5.0000 mg | ORAL_TABLET | Freq: Once | ORAL | Status: AC
  Administered 2020-07-22: 5 mg via ORAL
  Filled 2020-07-22: qty 1

## 2020-07-22 NOTE — Progress Notes (Signed)
CARDIAC REHAB PHASE I   PRE:  Rate/Rhythm: 58 SB  BP:  Sitting: 127/71      SaO2: 96 RA  MODE:  Ambulation: 470 ft   POST:  Rate/Rhythm: 86 SR PVCs  BP:  Sitting: 142/79      SaO2: 99 RA  Pt tolerated exercise well and amb 470 ft independently. Pt to recliner w/ call bell in reach. Discussed NTG use and exercise guidelines. Stressed importance of ASA and Plavix. Pt still worried about his wife. Encouraged pt to take time to care about himself. Discussed CPRHII. Will refer for CRPHII AP.  [id number]  Joya San, MS, ACM-CEP 07/22/2020 9:50 AM

## 2020-07-22 NOTE — Progress Notes (Signed)
ANTICOAGULATION CONSULT NOTE - Follow Up Consult  Pharmacy Consult for warfarin Indication:  LV thrombus  No Known Allergies  Patient Measurements: Height: 5\' 7"  (170.2 cm) Weight: 85 kg (187 lb 6.3 oz) IBW/kg (Calculated) : 66.1  Vital Signs: Temp: 98.8 F (37.1 C) (07/14 0418) Temp Source: Oral (07/14 0418) BP: 130/67 (07/14 0418) Pulse Rate: 60 (07/14 0418)  Labs: Recent Labs    07/21/20 0437 07/21/20 0711 07/21/20 1432 07/22/20 0028  HGB  --   --  12.5* 10.8*  HCT  --   --  39.4 34.2*  PLT  --   --  180 164  LABPROT  --   --  14.0 15.0  INR  --   --  1.1 1.2  CREATININE 1.16  --   --  1.56*  TROPONINIHS >24,000* >24,000*  --   --     Estimated Creatinine Clearance: 45.3 mL/min (A) (by C-G formula based on SCr of 1.56 mg/dL (H)).  Assessment: 70 yo male s/p STEMI s/p cath with stent to RCA. He is also noted with an LV thrombus and pharmacy consulted to dose warfarin while bridging with enoxaparin 85 mg q12h until INR >2. Baseline INR 1.1 and hemoglobin 12.5. Given warfarin 5mg  x 1 on 7/13. INR 1.2 and H/H 10.8/34.2 today. Plts stable.   Patient was educated on warfarin today.  Goal of Therapy:  INR 2-3 Monitor platelets by anticoagulation protocol: Yes   Plan:  -Lovenox 85mg  [place] q12h -Warfarin 5mg  po x1 today -Daily PT/INR -CBC daily  8/13, PharmD Pharmacy Resident  07/22/2020,11:19 AM

## 2020-07-22 NOTE — Progress Notes (Addendum)
Progress Note  Patient Name: Gregory Cole Date of Encounter: 07/22/2020  [facility] Cardiologist: Nicki Guadalajara, MD   Subjective   Walked in the hallway, no chest pain. Long discussion regarding medications.   Inpatient Medications    Scheduled Meds:  aspirin EC  81 mg Oral Q24H   atorvastatin  80 mg Oral q1800   clopidogrel  300 mg Oral Once   [START ON 07/23/2020] clopidogrel  75 mg Oral Daily   empagliflozin  10 mg Oral Daily   enoxaparin (LOVENOX) injection  1 mg/kg Subcutaneous BID   enoxaparin   Does not apply Once   insulin aspart  0-15 Units Subcutaneous TID WC   insulin aspart  0-5 Units Subcutaneous QHS   insulin aspart protamine- aspart  36 Units Subcutaneous Daily   levothyroxine  88 mcg Oral Q0600   losartan  100 mg Oral Daily   mouth rinse  15 mL Mouth Rinse BID   metoprolol tartrate  25 mg Oral BID   omega-3 acid ethyl esters  1 g Oral BID   pantoprazole  40 mg Oral Daily   sodium chloride flush  3 mL Intravenous Q12H   warfarin  5 mg Oral ONCE-1600   Warfarin - Pharmacist Dosing Inpatient   Does not apply q1600   Continuous Infusions:  sodium chloride     PRN Meds: sodium chloride, acetaminophen, nitroGLYCERIN, ondansetron (ZOFRAN) IV, oxyCODONE, sodium chloride flush   Vital Signs    Vitals:   07/21/20 1647 07/21/20 2020 07/22/20 0034 07/22/20 0418  BP: 110/72 114/67 120/62 130/67  Pulse: (!) 59 (!) 57 60 60  Resp:  (!) 21 20 14   Temp:  98.6 F (37 C) 98.6 F (37 C) 98.8 F (37.1 C)  TempSrc:  Oral Oral Oral  SpO2:  99% 99% 99%  Weight:      Height:        Intake/Output Summary (Last 24 hours) at 07/22/2020 1003 Last data filed at 07/22/2020 0402 Gross per 24 hour  Intake 320 ml  Output 1020 ml  Net -700 ml   Last 3 Weights 07/20/2020 07/20/2020 05/28/2020  Weight (lbs) 187 lb 6.3 oz 182 lb 5.1 oz 182 lb  Weight (kg) 85 kg 82.7 kg 82.555 kg      Telemetry    SR - Personally Reviewed  ECG    SR with continued TWI in  inferolateral leads - Personally Reviewed  Physical Exam   GEN: No acute distress.  Sitting up in the bedside chair Neck: No JVD Cardiac: RRR, no murmurs, rubs, or gallops.  Normal S1 and S2 Respiratory: Clear to auscultation bilaterally.  No W/R/R GI: Soft, nontender, non-distended  MS: No edema; No deformity. Right radial cath site stable.  Neuro:  Nonfocal  Psych: Normal affect   Labs    High Sensitivity Troponin:   Recent Labs  Lab 07/21/20 0437 07/21/20 0711  TROPONINIHS >24,000* >24,000*      Chemistry Recent Labs  Lab 07/21/20 0437 07/22/20 0028  NA 140 140  K 4.2 4.1  CL 113* 108  CO2 20* 24  GLUCOSE 126* 100*  BUN 20 20  CREATININE 1.16 1.56*  CALCIUM 8.6* 9.1  PROT  --  6.2*  ALBUMIN  --  3.2*  AST  --  145*  ALT  --  33  ALKPHOS  --  56  BILITOT  --  0.5  GFRNONAA >60 47*  ANIONGAP 7 8     Hematology Recent Labs  Lab  07/21/20 1432 07/22/20 0028  WBC 10.2 9.0  RBC 4.37 3.84*  HGB 12.5* 10.8*  HCT 39.4 34.2*  MCV 90.2 89.1  MCH 28.6 28.1  MCHC 31.7 31.6  RDW 15.1 15.2  PLT 180 164    BNPNo results for input(s): BNP, PROBNP in the last 168 hours.   DDimer No results for input(s): DDIMER in the last 168 hours.   Radiology    CARDIAC CATHETERIZATION  Result Date: 07/20/2020  Acute inferior ST elevation MI secondary to plaque rupture with acute occlusion.  Total occlusion of the proximal RCA is reduced to 0% with TIMI grade III flow after stenting with an 18 x 3.5 mm stent postdilated to 3.75 mm.  The stent slightly overlaps the stent already present in the mid right coronary.  Widely patent left main  LAD is totally occluded in the mid to distal segment with left to right and right to left collaterals that are faint.  Proximal to mid LAD contain moderate atherosclerosis and calcification.  Large ramus branch is widely patent including the proximal to mid stent.  Circumflex gives origin to 3 obtuse marginal branches and has mild  generalized atherosclerosis but no obstructive disease.  Left ventriculography demonstrates apical dyskinesis and possible LV thrombus.  Inferior wall and basal segment are hypokinetic.  EF 35 to 40%.  LVEDP 26 mmHg after ventriculography.  13 to 15 mmHg before ventriculography. RECOMMENDATIONS:  Complete 1 year of dual antiplatelet therapy.  Aggressive risk factor modification.  Further management per treating team  ECHOCARDIOGRAM COMPLETE  Result Date: 07/21/2020    ECHOCARDIOGRAM REPORT   Patient Name:   Gregory Cole [place] Date of Exam: 07/21/2020 Medical Rec #:  [id number]       Height:       67.0 in Accession #:    [id number]      Weight:       187.4 lb Date of Birth:  06/11/1950      BSA:          1.967 m Patient Age:    69 years        BP:           132/77 mmHg Patient Gender: M               HR:           68 bpm. Exam Location:  Inpatient Procedure: 2D Echo, Color Doppler and Cardiac Doppler Indications:    I50.21 Acute systolic (congestive) heart failure  History:        Patient has prior history of Echocardiogram examinations, most                 recent 04/06/2019. CAD; Risk Factors:Hypertension, Diabetes and                 Dyslipidemia.  Sonographer:    Irving Burton Senior RDCS Referring Phys: [id number] Barry Dienes [facility] IMPRESSIONS  1. Left ventricular ejection fraction, by estimation, is 30 to 35%. The left ventricle has moderately decreased function. The left ventricle demonstrates regional wall motion abnormalities the apical aneurysm, inferior akinesis, and inferolateral/inferoseptal severe hypokinesis. Left ventricular diastolic parameters are consistent with Grade I diastolic dysfunction (impaired relaxation). 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  2. Right ventricular systolic function is normal. The right ventricular size is normal. Tricuspid regurgitation signal is inadequate for assessing PA pressure.  3. The mitral valve is normal in structure. No evidence of mitral valve  regurgitation. No evidence  of mitral stenosis.  4. The aortic valve is tricuspid. Aortic valve regurgitation is not visualized. Mild aortic valve sclerosis is present, with no evidence of aortic valve stenosis.  5. The inferior vena cava is dilated in size with <50% respiratory variability, suggesting right atrial pressure of 15 mmHg.  6. Suspect apical thrombus but not completely clear with Definity. Would consider cardiac MRI to make firm diagnosis. FINDINGS  Left Ventricle: Left ventricular ejection fraction, by estimation, is 30 to 35%. The left ventricle has moderately decreased function. The left ventricle demonstrates regional wall motion abnormalities. The left ventricular internal cavity size was normal in size. There is mild left ventricular hypertrophy. Left ventricular diastolic parameters are consistent with Grade I diastolic dysfunction (impaired relaxation). Right Ventricle: The right ventricular size is normal. No increase in right ventricular wall thickness. Right ventricular systolic function is normal. Tricuspid regurgitation signal is inadequate for assessing PA pressure. Left Atrium: Left atrial size was normal in size. Right Atrium: Right atrial size was normal in size. Pericardium: There is no evidence of pericardial effusion. Mitral Valve: The mitral valve is normal in structure. Mild mitral annular calcification. No evidence of mitral valve regurgitation. No evidence of mitral valve stenosis. Tricuspid Valve: The tricuspid valve is normal in structure. Tricuspid valve regurgitation is not demonstrated. Aortic Valve: The aortic valve is tricuspid. Aortic valve regurgitation is not visualized. Mild aortic valve sclerosis is present, with no evidence of aortic valve stenosis. Pulmonic Valve: The pulmonic valve was normal in structure. Pulmonic valve regurgitation is not visualized. Aorta: The aortic root is normal in size and structure. Venous: The inferior vena cava is dilated in size with  less than 50% respiratory variability, suggesting right atrial pressure of 15 mmHg. IAS/Shunts: No atrial level shunt detected by color flow Doppler.  LEFT VENTRICLE PLAX 2D LVIDd:         5.00 cm  Diastology LVIDs:         4.00 cm  LV e' medial:    5.11 cm/s LV PW:         1.20 cm  LV E/e' medial:  10.7 LV IVS:        1.10 cm  LV e' lateral:   6.74 cm/s LVOT diam:     2.00 cm  LV E/e' lateral: 8.1 LV SV:         61 LV SV Index:   31 LVOT Area:     3.14 cm  RIGHT VENTRICLE RV S prime:     10.30 cm/s TAPSE (M-mode): 2.0 cm LEFT ATRIUM             Index       RIGHT ATRIUM           Index LA diam:        3.30 cm 1.68 cm/m  RA Area:     15.20 cm LA Vol (A2C):   44.3 ml 22.52 ml/m RA Volume:   37.80 ml  19.22 ml/m LA Vol (A4C):   41.5 ml 21.10 ml/m LA Biplane Vol: 44.6 ml 22.67 ml/m  AORTIC VALVE LVOT Vmax:   89.60 cm/s LVOT Vmean:  60.800 cm/s LVOT VTI:    0.194 m  AORTA Ao Root diam: 3.00 cm Ao Asc diam:  3.50 cm MITRAL VALVE MV Area (PHT): 2.37 cm    SHUNTS MV Decel Time: 320 msec    Systemic VTI:  0.19 m MV E velocity: 54.70 cm/s  Systemic Diam: 2.00 cm MV A velocity: 92.90 cm/s MV  E/A ratio:  0.59 Marca Ancona MD Electronically signed by Marca Ancona MD Signature Date/Time: 07/21/2020/12:02:47 PM    Final     Cardiac Studies   Cath: 07/20/20:  Cultprit Lesion: 100% Thrombotic proximal RCA occlusion.  There is a downstream stent in the mid vessel. (DES PCI-3.5 mm x18 mm -3.75 mm, overlapping mid RCA stent).  Tandem 70% then 50% PDA lesions, 50% PL 1.  Collaterals from the distal RCA perfuse the diffusely diseased distal LAD.Patent stent in the RI.  20% ostial LAD, 20 stent mid LAD followed by extensive disease then CTO of the mid LAD with bridging, left to left and right left collaterals.  Normal LCx with 3 OM branches - minimal disease..  EF estimated 35 to 40%.  LVEDP 26 mmHg.  Diagnostic                                                              Intervention         Echo: 07/21/20 Moderately  reduced LVEF 30 to 35%.  Apical aneurysm with inferior akinesis and inferolateral/inferoseptal/severe HK.  GR 1 DD.  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  Normal RV, unable to assess pressures.  Mild aortic sclerosis but no stenosis.  Elevated RAP/CVP of estimated 15 mmHg with dilated IVC.  Patient Profile     71 y.o. male with 70 y.o. male with a hx of CAD, HTN, HLD and DM II. He had PCI x3 in 2010 who is being seen 07/20/2020 for the evaluation of CP/STEMI.  Assessment & Plan    Acute Inf STEMI: hsTn peaked at 24000 x2. Underwent cardiac catheterization noted above with total occlusion of proximal RCA treated with PCI/DES x1 which slightly overlapped previous stent.  LAD noted to be totally occluded in the mid to distal segment with collaterals, along with patent stent in the ramus intermediate branch.  Initially placed on aspirin/Brilinta with recommendations for 1 year.  New finding of LV thrombus on echo. --Now transitioned to DAPT with aspirin/Plavix, loaded with 300 mg x 1 this morning with plans for 75 mg daily afterwards.  ICM: Echo noted EF of 30 to 35% with apical aneurysm, anterior akinesis and inferior lateral/septal hypokinesis, grade 1 diastolic dysfunction. ->Mild crackles on exam yesterday, given low-dose Lasix.  We will hold off on for now.  No active combined systolic and diastolic heart failure symptoms. --Transition from metoprolol to Coreg 3.125mg  BID -- on losartan with plans to transition to [facility] prior to dc. Cr elevated this morning. Will hold ARB today -- started on jardiance   LV thrombus: concern for thrombus noted on echo -- recommendations for follow up MRI to confirm -- has been started on coumadin/lovenox bridge -- PharmD dosing  DM: Hgb A1c 7.2 -- on SSI -- started jardiance   HTN: stable with current regimen -- transitioned from metoprolol to coreg -- holding losartan this morning with  elevated Cr  HLD: LDL 60 -- on high dose statin -- AST 145, check CMET in am  Tobacco use: cessation advised  AKI: Cr 1.16>>1.56 -- holding ARB today -- BMET in am  For questions or updates, please contact [facility] Please consult [email or web address] for contact info under  Signed, Laverda Page, NP  07/22/2020, 10:03 AM     ATTENDING ATTESTATION  I have seen, examined and evaluated the patient this AM along with Laverda Page, NP-C.  After reviewing all the available data and chart, we discussed the patients laboratory, study & physical findings as well as symptoms in detail. I agree with her findings, examination as well as impression recommendations as per our discussion.    Attending adjustments noted in italics.   Gerrit seems to be doing fine clinically, he is somewhat depressed.  I will think he fully understood the concept of LV thrombus.  I spent quite a bit of time talking to him about what this means.  I explained why we went ahead and prophylactically started on [facility] with the concern of apical thrombus.  Since we are using LAC, we have switched from Brilinta to Plavix.  Titrate medications as able.  I do think that he would benefit from [facility] based on his EF does appear to be reduced.Medication review being performed.    Bryan Lemma, M.D., M.S. Interventional Cardiologist   Pager # [phone] Phone # [phone] [address] [place], [place] [address]

## 2020-07-22 NOTE — Progress Notes (Addendum)
Heart Failure Stewardship Pharmacist Progress Note   PCP: Gwenlyn Fudge, FNP PCP-Cardiologist: Nicki Guadalajara, MD   HPI:  89 YOM with PMH of CAD, HTN, HLD, DMT2 who presented to the ED on 07/20/20 with inferior STEMI. Patient had stopped taking Brilinta due to cost. Patient reports symptoms beginning the night before admission after dinner. Symptoms included nausea and vomiting following with chest tightness and dyspnea. Patient became diaphoretic and called 911 less than one hour after onset of symptoms. ECHO on 7/13 shows LVEF 30-35%. L/RHC on 07/20/20 showed total occlusion of proximal RCA treated with PCI/DES. LAD noted to be totally occluded in the mid to distal segment with collaterals. New finding of LV thrombus on ECHO.  Current HF Medications: Carvedilol 3.125 mg BID Losartan 100 mg daily Farxiga 10 mg daily  Prior to admission HF Medications: Losartan 100 mg daily Metoprolol tartrate 25 mg BID Imdur 120 mg daily  Pertinent Lab Values: Serum creatinine 1.56 (up from 1.16 yesterday), BUN 20, Potassium 4.1, Sodium 140  Vital Signs: Weight: 187 lbs (admission weight: 187 lbs) Blood pressure: 130/70s  Heart rate: 60s   Medication Assistance / Insurance Benefits Check: Does the patient have prescription insurance?  Yes Type of insurance plan: [employer]  Does the patient qualify for medication assistance through manufacturers or grants?   Pending Eligible grants and/or patient assistance programs: pending Medication assistance applications in progress: none  Medication assistance applications approved: none Approved medication assistance renewals will be completed by: pending  Outpatient Pharmacy:  Prior to admission outpatient pharmacy: [facility] [place] [place]) Is the patient willing to use [facility] pharmacy at discharge? Yes Is the patient willing to transition their outpatient pharmacy to utilize a [facility] outpatient pharmacy?   Pending   Assessment: 1. Acute systolic  CHF (EF 30-35%), due to ICM. NYHA class II symptoms. - Agree with transitioning from metoprolol tartrate to carvedilol 3.125 mg BID - Continue losartan 100 mg daily - consider optimizing further to Entresto 24/26 mg BID prior to discharge - Consider starting spironolactone 12.5 mg daily prior to discharge - Agree with starting Farxiga 10 mg daily tomorrow - He was taking pioglitazone prior to admission - recommend discontinuing at discharge as it can worsen heart failure   Plan: 1) Medication changes recommended at this time: - agree with changes as above  2) Patient assistance: - Can help enroll him in patient assistance for Brilinta, Farxiga, and Entresto to help minimize cost/adherence issues - HF TOC appt made for 7/21  3)  Education  - To be completed prior to discharge  Asencion Gowda PharmD/MBA Candidate [employer], Class of 2023

## 2020-07-23 ENCOUNTER — Other Ambulatory Visit (HOSPITAL_COMMUNITY): Payer: Self-pay

## 2020-07-23 DIAGNOSIS — I10 Essential (primary) hypertension: Secondary | ICD-10-CM | POA: Diagnosis not present

## 2020-07-23 DIAGNOSIS — I2119 ST elevation (STEMI) myocardial infarction involving other coronary artery of inferior wall: Secondary | ICD-10-CM | POA: Diagnosis not present

## 2020-07-23 DIAGNOSIS — I25110 Atherosclerotic heart disease of native coronary artery with unstable angina pectoris: Secondary | ICD-10-CM | POA: Diagnosis not present

## 2020-07-23 DIAGNOSIS — Z955 Presence of coronary angioplasty implant and graft: Secondary | ICD-10-CM | POA: Diagnosis not present

## 2020-07-23 LAB — CBC
HCT: 33.7 % — ABNORMAL LOW (ref 39.0–52.0)
Hemoglobin: 11.2 g/dL — ABNORMAL LOW (ref 13.0–17.0)
MCH: 29.2 pg (ref 26.0–34.0)
MCHC: 33.2 g/dL (ref 30.0–36.0)
MCV: 87.8 fL (ref 80.0–100.0)
Platelets: 162 10*3/uL (ref 150–400)
RBC: 3.84 MIL/uL — ABNORMAL LOW (ref 4.22–5.81)
RDW: 14.8 % (ref 11.5–15.5)
WBC: 7.4 10*3/uL (ref 4.0–10.5)
nRBC: 0 % (ref 0.0–0.2)

## 2020-07-23 LAB — COMPREHENSIVE METABOLIC PANEL
ALT: 24 U/L (ref 0–44)
AST: 69 U/L — ABNORMAL HIGH (ref 15–41)
Albumin: 3.1 g/dL — ABNORMAL LOW (ref 3.5–5.0)
Alkaline Phosphatase: 56 U/L (ref 38–126)
Anion gap: 5 (ref 5–15)
BUN: 20 mg/dL (ref 8–23)
CO2: 24 mmol/L (ref 22–32)
Calcium: 8.8 mg/dL — ABNORMAL LOW (ref 8.9–10.3)
Chloride: 108 mmol/L (ref 98–111)
Creatinine, Ser: 1.43 mg/dL — ABNORMAL HIGH (ref 0.61–1.24)
GFR, Estimated: 52 mL/min — ABNORMAL LOW (ref >60)
Glucose, Bld: 161 mg/dL — ABNORMAL HIGH (ref 70–99)
Potassium: 3.4 mmol/L — ABNORMAL LOW (ref 3.5–5.1)
Sodium: 137 mmol/L (ref 135–145)
Total Bilirubin: 0.6 mg/dL (ref 0.3–1.2)
Total Protein: 6.4 g/dL — ABNORMAL LOW (ref 6.5–8.1)

## 2020-07-23 LAB — PROTIME-INR
INR: 1.3 — ABNORMAL HIGH (ref 0.8–1.2)
Prothrombin Time: 15.7 seconds — ABNORMAL HIGH (ref 11.4–15.2)

## 2020-07-23 LAB — GLUCOSE, CAPILLARY: Glucose-Capillary: 105 mg/dL — ABNORMAL HIGH (ref 70–99)

## 2020-07-23 MED ORDER — CLOPIDOGREL BISULFATE 75 MG PO TABS
75.0000 mg | ORAL_TABLET | Freq: Every day | ORAL | 3 refills | Status: DC
  Filled 2020-07-23: qty 90, 90d supply, fill #0

## 2020-07-23 MED ORDER — SACUBITRIL-VALSARTAN 49-51 MG PO TABS
1.0000 | ORAL_TABLET | Freq: Two times a day (BID) | ORAL | Status: DC
  Filled 2020-07-23: qty 1

## 2020-07-23 MED ORDER — DICLOFENAC SODIUM 1 % EX GEL: 4.0000 g | Freq: Four times a day (QID) | CUTANEOUS | Status: DC | PRN

## 2020-07-23 MED ORDER — DAPAGLIFLOZIN PROPANEDIOL 10 MG PO TABS
10.0000 mg | ORAL_TABLET | Freq: Every day | ORAL | 11 refills | Status: DC
  Filled 2020-07-23: qty 30, 30d supply, fill #0

## 2020-07-23 MED ORDER — SACUBITRIL-VALSARTAN 49-51 MG PO TABS
1.0000 | ORAL_TABLET | Freq: Two times a day (BID) | ORAL | 11 refills | Status: DC
  Filled 2020-07-23: qty 60, 30d supply, fill #0

## 2020-07-23 MED ORDER — PANTOPRAZOLE SODIUM 40 MG PO TBEC
40.0000 mg | DELAYED_RELEASE_TABLET | Freq: Every day | ORAL | 11 refills | Status: AC
  Filled 2020-07-23: qty 30, 30d supply, fill #0

## 2020-07-23 MED ORDER — POTASSIUM CHLORIDE CRYS ER 20 MEQ PO TBCR
40.0000 meq | EXTENDED_RELEASE_TABLET | Freq: Once | ORAL | Status: AC
  Administered 2020-07-23: 40 meq via ORAL
  Filled 2020-07-23: qty 2

## 2020-07-23 MED ORDER — CARVEDILOL 3.125 MG PO TABS
3.1250 mg | ORAL_TABLET | Freq: Two times a day (BID) | ORAL | 11 refills | Status: DC
  Filled 2020-07-23: qty 60, 30d supply, fill #0

## 2020-07-23 MED ORDER — FUROSEMIDE 40 MG PO TABS
40.0000 mg | ORAL_TABLET | Freq: Every day | ORAL | 1 refills | Status: DC | PRN
  Filled 2020-07-23: qty 30, 30d supply, fill #0

## 2020-07-23 NOTE — Progress Notes (Signed)
Pt is alert and oriented. Discharge instructions/ AVS given to pt. 

## 2020-07-23 NOTE — Discharge Summary (Addendum)
Discharge Summary    Patient ID: Gregory Cole MRN: [id number]; DOB: Oct 13, 1950  Admit date: 07/20/2020 Discharge date: 07/23/2020  PCP:  Gregory Fudge, FNP   [facility] Providers Cardiologist:  Gregory Guadalajara, MD  Cardiology APP:  Gregory Cole, [place]  {  Discharge Diagnoses    Principal Problem:   Acute ST elevation myocardial infarction (STEMI) of inferior wall involving right ventricle (HCC) Active Problems:   Type 2 diabetes mellitus with complication, without long-term current use of insulin (HCC)   Essential hypertension   Coronary artery disease involving native heart with unstable angina pectoris (HCC)   Tobacco use   Hyperlipidemia associated with type 2 diabetes mellitus (HCC)   Ischemic cardiomyopathy -> in setting of inferior STEMI, LV gram EF of 35 to 40% inferior wall hypokinesis.  LVEDP 26.   Presence of drug coated stent in right coronary artery -> second overlapping DES stent proximal to previous stent    Diagnostic Studies/Procedures    Cath: 07/20/20:  Cultprit Lesion: 100% Thrombotic proximal RCA occlusion.  There is a downstream stent in the mid vessel. (DES PCI-3.5 mm x18 mm -3.75 mm, overlapping mid RCA stent).       Tandem 70% then 50% PDA lesions, 50% PL 1.  Collaterals from the distal RCA perfuse the diffusely diseased distal LAD.Patent stent in the RI.  20% ostial LAD, 20 stent mid LAD followed by extensive disease then CTO of the mid LAD with bridging, left to left and right left collaterals.  Normal LCx with 3 OM branches - minimal disease..  EF estimated 35 to 40%.  LVEDP 26 mmHg.   Diagnostic                                                              Intervention        _______________________________________   Echo: 07/21/20 1. Left ventricular ejection fraction, by estimation, is 30 to 35%. The  left ventricle has moderately decreased function. The left ventricle  demonstrates regional wall motion abnormalities the apical aneurysm,   inferior akinesis, and  inferolateral/inferoseptal severe hypokinesis. Left ventricular diastolic  parameters are consistent with Grade I diastolic dysfunction (impaired  relaxation). 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   2. Right ventricular systolic function is normal. The right ventricular  size is normal. Tricuspid regurgitation signal is inadequate for assessing  PA pressure.   3. The mitral valve is normal in structure. No evidence of mitral valve  regurgitation. No evidence of mitral stenosis.   4. The aortic valve is tricuspid. Aortic valve regurgitation is not  visualized. Mild aortic valve sclerosis is present, with no evidence of  aortic valve stenosis.   5. The inferior vena cava is dilated in size with <50% respiratory  variability, suggesting right atrial pressure of 15 mmHg.   6. Suspect apical thrombus but not completely clear with Definity. Would  consider cardiac MRI to make firm diagnosis.  _____________   Cardiac MRI 07/22/20: Mildly decreased LV function, LVEF 43%.   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   Parametric mapping suggestive of myocardial infarction.   Apex does not appear viable. Inferior territory may be viable (less so for inferior mid).    History of Present Illness  Gregory Cole is a 70 y.o. male with a hx of CAD, HTN, HLD and DM II. He had PCI x3 in 2010 who is being seen 07/20/2020 for the evaluation of CP/STEMI.  Gregory Cole has a hx of CAD, HTN, HLD and DM II. He had PCI x3 in 2010 and was admitted to [facility] in March 2021 w/ NSTEMI. High-sensitivity troponin peaked at 1079.  TTE performed on 04/06/2019 showed EF 50%, akinetic and aneurysmal LV apex with no LV thrombus.  Cardiac catheterization performed on 04/07/2019 showed multivessel disease with 35% proximal RCA, 70% followed by 50% RPDA lesion, 100% mid to distal LAD occlusion, 20% proximal and mid LAD, 80% mid RCA lesion treated with DES, widely  patent ramus stent.  Since the distal LAD was being fed by faint bridging collaterals from the RCA, stenting of the mid RCA lesion resulted in improved collateralization to the distal LAD. It sounds like he has been doing fairly well up until about 1-2h prior to presentation, at which time he had onset of significant midsternal pain/pressure associated with nausea and diaphoresis, similar to prior angina. EMS was called, and pt was found to have ST elevation in the inferior leads on EKG. STEMI activated. From what patient states, he is no longer on brilinta due to cost. It does not sound like anything was given to substitute, although cardiology notes do mention possibly starting effient to cont the DAPT past the 12 mos post-PCI.  Of note: arrival by ambulance took longer than usual because of two rear flat tires requiring switching vehicles. Upon arrival, pt reported CP for 2 hrs prior to arrival.   He was taken urgently to cath lab with suspected stent thrombosis vs de novo proximal lesion.   [facility]     Consultants: none  STEMI CAD Angiography revealed 100% occlusion proximal RCA felt to be the culprit lesion. He also had downstream previously placed DES. Culprit lesion was treated with DES that overlapped mid RCA stent. Residual 70% --> 50% in the PDA with known CTO of mid LAD with collaterals. He tolerated the procedure well. Echocardiogram concerning for apical aneurysm with possible LV thrombus. He underwent definitive cardiac MR which ruled out LV thrombus (lovenox and coumadin were stopped).   Despite discontinuation of triple therapy, we will leave him on ASA and plavix due to significant side effects of briltina.   He has ambulated in the hall with cardiac rehab, no further chest pain.   Ischemic cardiomyopathy Systolic heart failure Hypertension Echo with EF 30-35%, grade 1 DD, apical anuerysm, anterior akinesis and inferior lateral/septal hypokinesis. Pt appears  euvolemic. Will transition 100 mg losartan to 49-51 mg entresto BID. Continue coreg 3.125 mg BID. Unable to titrate due to HR in the low 60s. He was also started on 10 mg farxiga - will likely transition to jardiance at [place].   I did review low sodium diet and daily weights with him. Will provide 40 mg lasix PRN for weight gain greater than 3 lbs overnight.  D/C'e home amlodipine, losartan, and imdur.   DM Actos switched to SGLT2i - discharged on farxiga due to availability of 30 day card at discharge vs jardiance 14 day card. He is aware that [facility] may [facility] to [place] for coverage.  A1c 7.2% improved form 8.8% 1 year ago.   Hyperlipidemia with LDL goal < 70 07/21/2020: Cholesterol 108; HDL 30; LDL Cholesterol 62; Triglycerides 81; VLDL 16 LDL is at goal on 80 mg lipitor. Given progression  of disease, he needs to consider PCSK9i.  Drug selection determined by [facility] coverage.   AKI sCr improving today at 1.43 from 1.56 yesterday, Admission sCr was 1.16. Repeat a BMP at follow up given new entresto.   Pt seen and examined with Dr. Herbie Baltimore and deemed stable for discharge.   Did the patient have an acute coronary syndrome (MI, NSTEMI, STEMI, etc) this admission?:  Yes                               AHA/ACC Clinical Performance & Quality Measures: Aspirin prescribed? - Yes ADP Receptor Inhibitor (Plavix/Clopidogrel, Brilinta/Ticagrelor or Effient/Prasugrel) prescribed (includes medically managed patients)? - Yes Beta Blocker prescribed? - Yes High Intensity Statin (Lipitor 40-80mg  or Crestor 20-40mg ) prescribed? - Yes EF assessed during THIS hospitalization? - Yes For EF <40%, was ACEI/ARB prescribed? - Yes For EF <40%, Aldosterone Antagonist (Spironolactone or Eplerenone) prescribed? - No - Reason:  BP Cardiac Rehab Phase II ordered (including medically managed patients)? - Yes      _____________  Discharge Vitals Blood pressure (!) 141/77, pulse 68, temperature 97.8 F (36.6 C),  temperature source Oral, resp. rate 20, height  (1.702 m), weight 85 kg, SpO2 97 %.  Filed Weights   07/20/20 2241 07/20/20 2330  Weight: 82.7 kg 85 kg   Physical Exam Constitutional:      Appearance: Normal appearance.  Eyes:     Extraocular Movements: Extraocular movements intact.  Neck:     Comments: + JVD Cardiovascular:     Rate and Rhythm: Normal rate and regular rhythm.     Heart sounds: No murmur heard. Pulmonary:     Effort: Pulmonary effort is normal.     Breath sounds: Normal breath sounds.  Abdominal:     General: Bowel sounds are normal.     Palpations: Abdomen is soft.  Neurological:     General: No focal deficit present.     Mental Status: He is alert and oriented to person, place, and time.  Psychiatric:        Behavior: Behavior normal.   Right radial cath site C/D/I   Labs & Radiologic Studies    CBC Recent Labs    07/22/20 0028 07/23/20 0158  WBC 9.0 7.4  HGB 10.8* 11.2*  HCT 34.2* 33.7*  MCV 89.1 87.8  PLT 164 162   Basic Metabolic Panel Recent Labs    16/10/96 0028 07/23/20 0158  NA 140 137  K 4.1 3.4*  CL 108 108  CO2 24 24  GLUCOSE 100* 161*  BUN 20 20  CREATININE 1.56* 1.43*  CALCIUM 9.1 8.8*   Liver Function Tests Recent Labs    07/22/20 0028 07/23/20 0158  AST 145* 69*  ALT 33 24  ALKPHOS 56 56  BILITOT 0.5 0.6  PROT 6.2* 6.4*  ALBUMIN 3.2* 3.1*   No results for input(s): LIPASE, AMYLASE in the last 72 hours. High Sensitivity Troponin:   Recent Labs  Lab 07/21/20 0437 07/21/20 0711  TROPONINIHS >24,000* >24,000*    BNP Invalid input(s): POCBNP D-Dimer No results for input(s): DDIMER in the last 72 hours. Hemoglobin A1C Recent Labs    07/22/20 0028  HGBA1C 7.2*   Fasting Lipid Panel Recent Labs    07/21/20 0437  CHOL 108  HDL 30*  LDLCALC 62  TRIG 81  CHOLHDL 3.6   Thyroid Function Tests Recent Labs    07/21/20 0437  TSH 1.003  _____________  CARDIAC CATHETERIZATION  Result Date:  07/20/2020  Acute inferior ST elevation MI secondary to plaque rupture with acute occlusion.  Total occlusion of the proximal RCA is reduced to 0% with TIMI grade III flow after stenting with an 18 x 3.5 mm stent postdilated to 3.75 mm.  The stent slightly overlaps the stent already present in the mid right coronary.  Widely patent left main  LAD is totally occluded in the mid to distal segment with left to right and right to left collaterals that are faint.  Proximal to mid LAD contain moderate atherosclerosis and calcification.  Large ramus branch is widely patent including the proximal to mid stent.  Circumflex gives origin to 3 obtuse marginal branches and has mild generalized atherosclerosis but no obstructive disease.  Left ventriculography demonstrates apical dyskinesis and possible LV thrombus.  Inferior wall and basal segment are hypokinetic.  EF 35 to 40%.  LVEDP 26 mmHg after ventriculography.  13 to 15 mmHg before ventriculography. RECOMMENDATIONS:  Complete 1 year of dual antiplatelet therapy.  Aggressive risk factor modification.  Further management per treating team  MR CARDIAC MORPHOLOGY W WO CONTRAST  Result Date: 07/22/2020 CLINICAL DATA:  Clinical question of ventricular function Study assumes HCT of 34 EXAM: CARDIAC MRI TECHNIQUE: The patient was scanned on a 1.5 Tesla GE magnet. A dedicated cardiac coil was used. Functional imaging was done using Fiesta sequences. 2,3, and 4 chamber views were done to assess for RWMA's. Modified Simpson's rule using a short axis stack was used to calculate an ejection fraction on a dedicated work [employer]. The patient received 10 cc of Gadavist. After 10 minutes inversion recovery sequences were used to assess for infiltration and scar tissue. CONTRAST:  10 cc  of Gadavist FINDINGS: 1. Normal left ventricular size, with LVEDD 53 mm, and LVEDVi 83 mL/m2. Normal left ventricular thickness, with intraventricular septal thickness  of 9 mm, posterior wall thickness of 7 mm, and myocardial mass 63 g/m2. Normal left ventricular systolic function (LVEF =43%). There is basal, mid, and apical inferior septal and inferior hypokinesis; there is a notable apical aneurysm. There is no thrombus. Left ventricular parametric mapping notable for significant increase in ECV signal in the apex (60% at highest), with moderate increase in ECV in the mid and basal inferoseptal, inferior, inferolateral (35-45%), and elevated T2 signal in the apex. There is late gadolinium enhancement in the left ventricular myocardium: Inferior, basal, endomyocardial (up to 50% myocardium), Inferior, mid, endomyocardial (up to 75% myocardium), apical, transmural in all segments. 2. Normal right ventricular size with RVEDVI 62 mL/m2. Normal right ventricular thickness. Normal right ventricular systolic function (RVEF =52%). There are no regional wall motion abnormalities or aneurysms. 3.  Normal left and right atrial size. 4. Normal size of the aortic root, ascending aorta and pulmonary artery for age and gender. 5.  No significant valvular abnormalities. 6.  Normal pericardium.  No pericardial effusion. 7. Grossly, no extracardiac findings. Recommended dedicated study if concerned for non-cardiac pathology. IMPRESSION: Mildly decreased LV function, LVEF 43%. 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 Parametric mapping suggestive of myocardial infarction. Apex does not appear viable. Inferior territory may be viable (less so for inferior mid). Riley Lam MD Electronically Signed   By: Riley Lam MD   On: 07/22/2020 16:04   ECHOCARDIOGRAM COMPLETE  Result Date: 07/21/2020    ECHOCARDIOGRAM REPORT   Patient Name:   Gregory Cole Date of Exam: 07/21/2020 Medical Rec #:  [id number]       Height:       67.0 in Accession #:    [id number]      Weight:       187.4 lb Date of Birth:  [birth year]      BSA:          1.967 m Patient Age:    69 years         BP:           132/77 mmHg Patient Gender: M               HR:           68 bpm. Exam Location:  Inpatient Procedure: 2D Echo, Color Doppler and Cardiac Doppler Indications:    I50.21 Acute systolic (congestive) heart failure  History:        Patient has prior history of Echocardiogram examinations, most                 recent 04/06/2019. CAD; Risk Factors:Hypertension, Diabetes and                 Dyslipidemia.  Sonographer:    Irving Burton Senior RDCS Referring Phys: [phone] Barry Dienes [facility] IMPRESSIONS  1. Left ventricular ejection fraction, by estimation, is 30 to 35%. The left ventricle has moderately decreased function. The left ventricle demonstrates regional wall motion abnormalities the apical aneurysm, inferior akinesis, and inferolateral/inferoseptal severe hypokinesis. Left ventricular diastolic parameters are consistent with Grade I diastolic dysfunction (impaired relaxation). 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  2. Right ventricular systolic function is normal. The right ventricular size is normal. Tricuspid regurgitation signal is inadequate for assessing PA pressure.  3. The mitral valve is normal in structure. No evidence of mitral valve regurgitation. No evidence of mitral stenosis.  4. The aortic valve is tricuspid. Aortic valve regurgitation is not visualized. Mild aortic valve sclerosis is present, with no evidence of aortic valve stenosis.  5. The inferior vena cava is dilated in size with <50% respiratory variability, suggesting right atrial pressure of 15 mmHg.  6. Suspect apical thrombus but not completely clear with Definity. Would consider cardiac MRI to make firm diagnosis. FINDINGS  Left Ventricle: Left ventricular ejection fraction, by estimation, is 30 to 35%. The left ventricle has moderately decreased function. The left ventricle demonstrates regional wall motion abnormalities. The left ventricular internal cavity size was normal in size. There is mild left ventricular  hypertrophy. Left ventricular diastolic parameters are consistent with Grade I diastolic dysfunction (impaired relaxation). Right Ventricle: The right ventricular size is normal. No increase in right ventricular wall thickness. Right ventricular systolic function is normal. Tricuspid regurgitation signal is inadequate for assessing PA pressure. Left Atrium: Left atrial size was normal in size. Right Atrium: Right atrial size was normal in size. Pericardium: There is no evidence of pericardial effusion. Mitral Valve: The mitral valve is normal in structure. Mild mitral annular calcification. No evidence of mitral valve regurgitation. No evidence of mitral valve stenosis. Tricuspid Valve: The tricuspid valve is normal in structure. Tricuspid valve regurgitation is not demonstrated. Aortic Valve: The aortic valve is tricuspid. Aortic valve regurgitation is not visualized. Mild aortic valve sclerosis is present, with no evidence of aortic valve stenosis. Pulmonic Valve: The pulmonic valve was normal in structure. Pulmonic valve regurgitation is not visualized. Aorta: The aortic root is normal in size and structure. Venous: The inferior vena cava is dilated in size with less than 50% respiratory variability,  suggesting right atrial pressure of 15 mmHg. IAS/Shunts: No atrial level shunt detected by color flow Doppler.  LEFT VENTRICLE PLAX 2D LVIDd:         5.00 cm  Diastology LVIDs:         4.00 cm  LV e' medial:    5.11 cm/s LV PW:         1.20 cm  LV E/e' medial:  10.7 LV IVS:        1.10 cm  LV e' lateral:   6.74 cm/s LVOT diam:     2.00 cm  LV E/e' lateral: 8.1 LV SV:         61 LV SV Index:   31 LVOT Area:     3.14 cm  RIGHT VENTRICLE RV S prime:     10.30 cm/s TAPSE (M-mode): 2.0 cm LEFT ATRIUM             Index       RIGHT ATRIUM           Index LA diam:        3.30 cm 1.68 cm/m  RA Area:     15.20 cm LA Vol (A2C):   44.3 ml 22.52 ml/m RA Volume:   37.80 ml  19.22 ml/m LA Vol (A4C):   41.5 ml 21.10 ml/m LA  Biplane Vol: 44.6 ml 22.67 ml/m  AORTIC VALVE LVOT Vmax:   89.60 cm/s LVOT Vmean:  60.800 cm/s LVOT VTI:    0.194 m  AORTA Ao Root diam: 3.00 cm Ao Asc diam:  3.50 cm MITRAL VALVE MV Area (PHT): 2.37 cm    SHUNTS MV Decel Time: 320 msec    Systemic VTI:  0.19 m MV E velocity: 54.70 cm/s  Systemic Diam: 2.00 cm MV A velocity: 92.90 cm/s MV E/A ratio:  0.59 Marca Ancona MD Electronically signed by Marca Ancona MD Signature Date/Time: 07/21/2020/12:02:47 PM    Final    Disposition   Pt is being discharged home today in good condition.  Follow-up Plans & Appointments     Follow-up Information     Lennette Bihari, MD Follow up on 08/02/2020.   Specialty: Cardiology Why: 4:00 pm Contact information: [address] [place] [address] [phone]                Discharge Instructions     Amb Referral to Cardiac Rehabilitation   Complete by: As directed    Diagnosis:  Coronary Stents PTCA NSTEMI     After initial evaluation and assessments completed: Virtual Based Care may be provided alone or in conjunction with Phase 2 Cardiac Rehab based on patient barriers.: Yes       Discharge Medications   Allergies as of 07/23/2020   No Known Allergies      Medication List     STOP taking these medications    amLODipine 2.5 MG tablet Commonly known as: NORVASC   isosorbide mononitrate 120 MG 24 hr tablet Commonly known as: IMDUR   losartan 100 MG tablet Commonly known as: COZAAR   metoprolol tartrate 25 MG tablet Commonly known as: LOPRESSOR   omeprazole 20 MG capsule Commonly known as: PRILOSEC Replaced by: pantoprazole 40 MG tablet   pioglitazone 45 MG tablet Commonly known as: ACTOS       TAKE these medications    aspirin EC 81 MG tablet Take 81 mg by mouth daily. Evening 1600   atorvastatin 80 MG tablet Commonly known as: LIPITOR Take  1 tablet (80 mg total) by mouth daily at 6 PM.   carvedilol 3.125 MG tablet Commonly known as:  COREG Take 1 tablet (3.125 mg total) by mouth 2 (two) times daily with a meal.   clopidogrel 75 MG tablet Commonly known as: PLAVIX Take 1 tablet (75 mg total) by mouth daily.   dapagliflozin propanediol 10 MG Tabs tablet Commonly known as: FARXIGA Take 1 tablet (10 mg total) by mouth daily.   diclofenac Sodium 1 % Gel Commonly known as: Voltaren Apply 4 g topically 4 (four) times daily as needed (knee pain).   FISH OIL PO Take 1,000 mg by mouth 2 (two) times daily.   furosemide 40 MG tablet Commonly known as: Lasix Take 1 tablet (40 mg total) by mouth daily as needed (For weight gain > 3 lbs overnight.).   guaiFENesin 100 MG/5ML liquid Commonly known as: ROBITUSSIN Take 200 mg by mouth 3 (three) times daily as needed for cough.   levothyroxine 88 MCG tablet Commonly known as: SYNTHROID Take 88 mcg by mouth daily before breakfast.   metFORMIN 850 MG tablet Commonly known as: GLUCOPHAGE Take 850 mg by mouth 2 (two) times daily with a meal.   nitroGLYCERIN 0.4 MG SL tablet Commonly known as: NITROSTAT Place under the tongue.   NovoLOG Mix 70/30 FlexPen (70-30) 100 UNIT/ML FlexPen Generic drug: insulin aspart protamine - aspart Inject 36 Units into the skin daily. Before dinner.   pantoprazole 40 MG tablet Commonly known as: PROTONIX Take 1 tablet (40 mg total) by mouth daily. Replaces: omeprazole 20 MG capsule   sacubitril-valsartan 49-51 MG Commonly known as: ENTRESTO Take 1 tablet by mouth 2 (two) times daily.           Outstanding Labs/Studies   BMP at follow up  Duration of Discharge Encounter   Greater than 30 minutes including physician time.  Signed, Roe Rutherfordngela Nicole Duke, PA 07/23/2020, 9:28 AM   ATTENDING ATTESTATION  I have seen, examined and evaluated the patient this AM on rounds along with [facility] Duke,PA-C.  After reviewing all the available data and chart, we discussed the patients laboratory, study & physical findings as well as symptoms  in detail. I agree with her findings, examination, impression & recommendations in her Summary as per our discussion.    Leg looks much better today.  Very happy to hear the results of his cardiac MRI that showed EF of roughly 43% (40 to 45%).  The inferior wall is still hypokinetic but notably improved from echo.  THERE IS STILL A PROMINENT APICAL ANEURYSM, BUT NO THROMBUS.  We can therefore discontinue warfarin and go back to aspirin plus Plavix.  We are using Plavix over Brilinta because of his pretty significant breathing issues on Plavix both in the past and during his hospitalization.  He will be discharged on a beta-blocker, calcium channel blocker and ARB along with Imdur. High-dose statin.   He is feeling much better.  Very happy to hear the results.  Ready for discharge today.  Agree with summary.    Bryan Lemmaavid Milica Gully, M.D., M.S. Interventional Cardiologist   Pager # [phone] Phone # [phone] 349 2684 [address] [place], [place] [address]

## 2020-07-23 NOTE — Progress Notes (Signed)
CARDIAC REHAB PHASE I   PRE:  Rate/Rhythm: 68 SR  BP:  Sitting: 133/76      SaO2: 96 RA  MODE:  Ambulation: 470 ft   POST:  Rate/Rhythm: 106 ST PVCs  BP:  Sitting: 141/77      SaO2: 99 RA  Pt tolerated exercise well and amb 470 ft independently. Pt to bed after walk w/ call bell in reach. PA discussed EF and HF.  [id number]  Joya San, MS, ACM-CEP 07/23/2020 9:12 AM

## 2020-07-23 NOTE — Progress Notes (Signed)
Heart Failure Stewardship Pharmacist Progress Note   PCP: Gwenlyn Fudge, FNP PCP-Cardiologist: Nicki Guadalajara, MD   HPI:  1 YOM with PMH of CAD, HTN, HLD, DMT2 who presented to the ED on 07/20/20 with inferior STEMI. Patient had stopped taking Brilinta due to cost. Patient reports symptoms beginning the night before admission after dinner. Symptoms included nausea and vomiting following with chest tightness and dyspnea. Patient became diaphoretic and called 911 less than one hour after onset of symptoms. ECHO on 7/13 shows LVEF 30-35%. L/RHC on 07/20/20 showed total occlusion of proximal RCA treated with PCI/DES. LAD noted to be totally occluded in the mid to distal segment with collaterals. New finding of LV thrombus on ECHO.  Discharge HF Medications: Furosemide 40 mg daily PRN Carvedilol 3.125 mg BID Entresto 49/51 mg BID Farxiga 10 mg daily  Prior to admission HF Medications: Losartan 100 mg daily Metoprolol tartrate 25 mg BID Imdur 120 mg daily  Pertinent Lab Values: Serum creatinine 1.43, BUN 20, Potassium 3.4, Sodium 137  Vital Signs: Weight: 187 lbs (admission weight: 187 lbs) Blood pressure: 120/70s  Heart rate: 60s   Medication Assistance / Insurance Benefits Check: Does the patient have prescription insurance?  Yes Type of insurance plan: Hum[employer] Medicare  Does the patient qualify for medication assistance through manufacturers or grants?   Pending Eligible grants and/or patient assistance programs: pending Medication assistance applications in progress: none  Medication assistance applications approved: none Approved medication assistance renewals will be completed by: pending  Outpatient Pharmacy:  Prior to admission outpatient pharmacy: [facility] [place]) Is the patient willing to use [facility] TOC pharmacy at discharge? Yes Is the patient willing to transition their outpatient pharmacy to utilize a [facility] outpatient pharmacy?   Pending   Assessment: 1. Acute  systolic CHF (EF 73-41%), due to ICM. NYHA class II symptoms. - Continue carvedilol 3.125 mg BID - Agree with transitioning losartan to Entresto 49/51 mg BID  - Consider starting spironolactone at follow up - Continue Farxiga 10 mg daily  - He was taking pioglitazone prior to admission - agree with discontinuing at discharge as it can worsen heart failure   Plan: 1) Medication changes recommended at this time: - agree with changes as above  2) Patient assistance: - Can help enroll him in patient assistance for Farxiga, and Entresto to help minimize cost/adherence issues during follow up appt - Free 30-day cards to be provided from [facility] discharge pharmacy  - HF TOC appt made for 7/21  Sharen Hones, PharmD, BCPS Heart Failure Stewardship Pharmacist Phone [phone]  Please check [email or web address] for unit-specific pharmacist phone numbers

## 2020-07-23 NOTE — Care Management Important Message (Signed)
Important Message  Patient Details  Name: Gregory Cole MRN: [id number] Date of Birth: 11-11-1950   Medicare Important Message Given:  Yes     Renie Ora 07/23/2020, 9:18 AM

## 2020-07-28 ENCOUNTER — Telehealth (HOSPITAL_COMMUNITY): Payer: Self-pay | Admitting: Surgery

## 2020-07-28 NOTE — Progress Notes (Addendum)
[facility] Transitions of Care Clinic  POE:UMPNT, Britney Primary Cardiologist:Kelly, Maisie Fus  HPI:  Gregory Cole is a 70 y.o.  male  with a PMH significant for CAD, HTN, HLD and DM II, ischemic cardiomyopathy, recent STEMI  PCI x3 in 2010  04/06/2019 NSTEMI.  ECHO 04/06/2019 showed EF 50%, akinetic and aneurysmal LV apex with no LV thrombus.  Cardiac catheterization performed on 04/07/2019 showed multivessel disease with 35% proximal RCA, 70% followed by 50% RPDA lesion, 100% mid to distal LAD occlusion, 20% proximal and mid LAD, 80% mid RCA lesion treated with DES, widely patent ramus stent.  Since the distal LAD was being fed by faint bridging collaterals from the RCA, stenting of the mid RCA lesion resulted in improved collateralization to the distal LAD.  07/20/20 patient developed chest pain, EMS was called and pt was found to have ST elevation in the inferior leads on EKG. STEMI activated. Apparently plan was to have DAPT for > 1 yr but not on brilinta due to cost.  Patient took >2 hours to arrive due to flat tires in ambulance.  Went for emergent repeath LHC with results below:  Acute inferior ST elevation MI secondary to plaque rupture with acute occlusion. Total occlusion of the proximal RCA is reduced to 0% with TIMI grade III flow after stenting with an 18 x 3.5 mm stent postdilated to 3.75 mm.  The stent slightly overlaps the stent already present in the mid right coronary. Widely patent left main LAD is totally occluded in the mid to distal segment with left to right and right to left collaterals that are faint.  Proximal to mid LAD contain moderate atherosclerosis and calcification. Large ramus branch is widely patent including the proximal to mid stent. Circumflex gives origin to 3 obtuse marginal branches and has mild generalized atherosclerosis but no obstructive disease. Left ventriculography demonstrates apical dyskinesis and possible LV thrombus.  Inferior  wall and basal segment are hypokinetic.  EF 35 to 40%.  LVEDP 26 mmHg after ventriculography.  13 to 15 mmHg before ventriculography.  Repeat ECHO 07/21/20: EF 30-35%, RWMA and aneurysmal apex, G1DD, suspected  LV thrombus at the apex.  Had cardiac MRI 07/22/20:  Mildly decreased LV function, LVEF 43%.   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   Parametric mapping suggestive of myocardial infarction.   Apex does not appear viable. Inferior territory may be viable (less so for inferior mid).  Rec'd one dose IV lasix 20mg  during admission.  GDMT titrated and he was switched form losartan to entresto 49/51, farxiga 10, coreg 3/125 BID, lasix 40 PRN.  Weights taken during hospitalization inaccurate.    Here for hospital FU.  Feeling weak since hospitalization, having acid reflux when laying down and trying to sleep at night.  No chest pain with exertion but does have some shortness of breath especially when walking his dog in the heat of the day. Walking from the parking lot to here no chest pain or dyspnea.  Walking up and down stairs without dyspnea or chest pain.   BP at home has been about 110-120 systolic and 60-70 diastolic.  Some lightheadedness and dizziness with standing.  Has only taken his thyroid pill today.  Has not needed any lasix, no LE edema or weight gain. Weight has been stable at home at about 179lbs.     ROS: All systems negative except as listed in HPI, PMH and Problem List.  SH:  Social History  Socioeconomic History   Marital status: Married    Spouse name: Not on file   Number of children: 1   Years of education: Not on file   Highest education level: Not on file  Occupational History   Not on file  Tobacco Use   Smoking status: Former    Packs/day: 2.00    Years: 40.00    Pack years: 80.00    Types: Cigarettes   Smokeless tobacco: Never  Vaping Use   Vaping Use: Never used  Substance and Sexual Activity   Alcohol use: Yes    Comment:  occasional glass of wine   Drug use: No   Sexual activity: Not Currently  Other Topics Concern   Not on file  Social History Narrative   Lives with his wife - first floor of apartment complex   Social Determinants of Health   Financial Resource Strain: Low Risk    Difficulty of Paying Living Expenses: Not hard at all  Food Insecurity: No Food Insecurity   Worried About Programme researcher, broadcasting/film/video in the Last Year: Never true   Ran Out of Food in the Last Year: Never true  Transportation Needs: No Transportation Needs   Lack of Transportation (Medical): No   Lack of Transportation (Non-Medical): No  Physical Activity: Sufficiently Active   Days of Exercise per Week: 7 days   Minutes of Exercise per Session: 30 min  Stress: No Stress Concern Present   Feeling of Stress : Not at all  Social Connections: Moderately Integrated   Frequency of Communication with Friends and Family: Once a week   Frequency of Social Gatherings with Friends and Family: Once a week   Attends Religious Services: More than 4 times per year   Active Member of [employer] or Organizations: Yes   Attends Engineer, structural: More than 4 times per year   Marital Status: Married  Catering manager Violence: Not At Risk   Fear of Current or Ex-Partner: No   Emotionally Abused: No   Physically Abused: No   Sexually Abused: No    FH:  Family History  Problem Relation Age of Onset   Stroke Mother    Heart disease Father     Past Medical History:  Diagnosis Date   Coronary artery disease    Diabetes mellitus without complication (HCC)    GERD (gastroesophageal reflux disease)    Glaucoma    Hypercholesteremia    Hypertension    Lesion of lung    [facility]   Myocardial infarction (HCC)    x3   Postablative hypothyroidism 03/18/2020   Sleep apnea    unable to tolerate CPAP    Current Outpatient Medications  Medication Sig Dispense Refill   aspirin EC 81 MG tablet Take 81 mg by  mouth daily. Evening 1600     atorvastatin (LIPITOR) 80 MG tablet Take 1 tablet (80 mg total) by mouth daily at 6 PM. 30 tablet 0   carvedilol (COREG) 3.125 MG tablet Take 1 tablet (3.125 mg total) by mouth 2 (two) times daily with a meal. 60 tablet 11   clopidogrel (PLAVIX) 75 MG tablet Take 1 tablet (75 mg total) by mouth daily. 90 tablet 3   dapagliflozin propanediol (FARXIGA) 10 MG TABS tablet Take 1 tablet (10 mg total) by mouth daily. 30 tablet 11   diclofenac Sodium (VOLTAREN) 1 % GEL Apply 4 g topically 4 (four) times daily as needed (knee pain).  furosemide (LASIX) 40 MG tablet Take 1 tablet (40 mg total) by mouth daily as needed (For weight gain > 3 lbs overnight.). 30 tablet 1   guaiFENesin (ROBITUSSIN) 100 MG/5ML liquid Take 200 mg by mouth 3 (three) times daily as needed for cough.     insulin aspart protamine - aspart (NOVOLOG MIX 70/30 FLEXPEN) (70-30) 100 UNIT/ML FlexPen Inject 36 Units into the skin daily. Before dinner.     levothyroxine (SYNTHROID, LEVOTHROID) 88 MCG tablet Take 88 mcg by mouth daily before breakfast.     metFORMIN (GLUCOPHAGE) 850 MG tablet Take 850 mg by mouth 2 (two) times daily with a meal.     nitroGLYCERIN (NITROSTAT) 0.4 MG SL tablet Place under the tongue.     Omega-3 Fatty Acids (FISH OIL PO) Take 1,000 mg by mouth 2 (two) times daily.     pantoprazole (PROTONIX) 40 MG tablet Take 1 tablet (40 mg total) by mouth daily. 30 tablet 11   sacubitril-valsartan (ENTRESTO) 49-51 MG Take 1 tablet by mouth 2 (two) times daily. 60 tablet 11   No current facility-administered medications for this encounter.    Vitals:   07/29/20 0847  BP: 118/64  Pulse: 97  SpO2: 96%  Weight: 83.2 kg (183 lb 6.4 oz)    PHYSICAL EXAM: Cardiac: JVD flat, normal rate and rhythm, clear s1 and s2, no murmurs, rubs or gallops, no LE edema Pulmonary: CTAB, not in distress Abdominal: non distended abdomen, soft and nontender Psych: Alert, conversant, in good spirits  ECG    NSR rate 91, old inferior infarct  ASSESSMENT & PLAN: Chronic Systolic CHF: -Ischemic cardiomyopathy -ECHO 07/21/20: EF 30-35%, RWMA and aneurysmal apex, G1DD, suspected  LV thrombus at the apex -Cardiac MRI 07/22/20 estimated EF 43%, Apex does not appear viable. Inferior territory may be viable (less so for inferior mid). No LV thrombus in aneurysmal apex -NYHA Class II-III symptoms, dry on exam -Not on diuretic and dry on exam, initial plan was to liberalize fluid intake but with hyperkalemia and increased BUN and cr similar to discharge cr I called and had patient hold entresto, likely could start back losartan at FU visit with Dr. Tresa Endo in a few days. -continue carvedilol 3.125 BID, switch farxiga 10 to jardiance 10 due to [place] formulary (gets meds free from [facility], also refilled all other cardiac meds there)  CAD: -long CAD history as outlined above most recently inferior STEMI s/p PCI to RCA 07/2020 -continue DAPT, statin  T2DM: -Continue farxiga  AKI: -AKI during hospitalization 1.16>1.43 day of discharge -repeat bmp    Follow up in 4 days with Dr. Tresa Endo

## 2020-07-28 NOTE — Telephone Encounter (Signed)
I called patient to provide a reminder of his upcoming appt in the [facility] scheduled in the morning.  He clarified directions and says that he plans to make the appt.

## 2020-07-29 ENCOUNTER — Telehealth (HOSPITAL_COMMUNITY): Payer: Self-pay

## 2020-07-29 ENCOUNTER — Ambulatory Visit (HOSPITAL_COMMUNITY)
Admit: 2020-07-29 | Discharge: 2020-07-29 | Disposition: A | Discharge status: Home / Self Care | Payer: Medicare PPO | Attending: Internal Medicine | Admitting: Internal Medicine

## 2020-07-29 ENCOUNTER — Other Ambulatory Visit: Payer: Self-pay

## 2020-07-29 ENCOUNTER — Encounter (HOSPITAL_COMMUNITY): Payer: Self-pay

## 2020-07-29 ENCOUNTER — Other Ambulatory Visit (HOSPITAL_COMMUNITY): Payer: Self-pay

## 2020-07-29 VITALS — BP 118/64 | HR 97 | Wt 183.4 lb

## 2020-07-29 DIAGNOSIS — Z794 Long term (current) use of insulin: Secondary | ICD-10-CM | POA: Diagnosis not present

## 2020-07-29 DIAGNOSIS — Z79899 Other long term (current) drug therapy: Secondary | ICD-10-CM | POA: Diagnosis not present

## 2020-07-29 DIAGNOSIS — I110 Hypertensive heart disease with heart failure: Secondary | ICD-10-CM | POA: Insufficient documentation

## 2020-07-29 DIAGNOSIS — Z7984 Long term (current) use of oral hypoglycemic drugs: Secondary | ICD-10-CM | POA: Diagnosis not present

## 2020-07-29 DIAGNOSIS — Z791 Long term (current) use of non-steroidal anti-inflammatories (NSAID): Secondary | ICD-10-CM | POA: Diagnosis not present

## 2020-07-29 DIAGNOSIS — I2510 Atherosclerotic heart disease of native coronary artery without angina pectoris: Secondary | ICD-10-CM | POA: Diagnosis not present

## 2020-07-29 DIAGNOSIS — I252 Old myocardial infarction: Secondary | ICD-10-CM | POA: Insufficient documentation

## 2020-07-29 DIAGNOSIS — Z87891 Personal history of nicotine dependence: Secondary | ICD-10-CM | POA: Insufficient documentation

## 2020-07-29 DIAGNOSIS — K219 Gastro-esophageal reflux disease without esophagitis: Secondary | ICD-10-CM | POA: Insufficient documentation

## 2020-07-29 DIAGNOSIS — E119 Type 2 diabetes mellitus without complications: Secondary | ICD-10-CM | POA: Diagnosis not present

## 2020-07-29 DIAGNOSIS — I255 Ischemic cardiomyopathy: Secondary | ICD-10-CM | POA: Insufficient documentation

## 2020-07-29 DIAGNOSIS — I25110 Atherosclerotic heart disease of native coronary artery with unstable angina pectoris: Secondary | ICD-10-CM

## 2020-07-29 DIAGNOSIS — Z8249 Family history of ischemic heart disease and other diseases of the circulatory system: Secondary | ICD-10-CM | POA: Insufficient documentation

## 2020-07-29 DIAGNOSIS — I5022 Chronic systolic (congestive) heart failure: Secondary | ICD-10-CM | POA: Diagnosis not present

## 2020-07-29 DIAGNOSIS — Z7982 Long term (current) use of aspirin: Secondary | ICD-10-CM | POA: Diagnosis not present

## 2020-07-29 DIAGNOSIS — N179 Acute kidney failure, unspecified: Secondary | ICD-10-CM | POA: Diagnosis not present

## 2020-07-29 DIAGNOSIS — Z955 Presence of coronary angioplasty implant and graft: Secondary | ICD-10-CM | POA: Insufficient documentation

## 2020-07-29 DIAGNOSIS — E7800 Pure hypercholesterolemia, unspecified: Secondary | ICD-10-CM | POA: Diagnosis not present

## 2020-07-29 DIAGNOSIS — E118 Type 2 diabetes mellitus with unspecified complications: Secondary | ICD-10-CM

## 2020-07-29 LAB — BASIC METABOLIC PANEL
Anion gap: 7 (ref 5–15)
BUN: 30 mg/dL — ABNORMAL HIGH (ref 8–23)
CO2: 25 mmol/L (ref 22–32)
Calcium: 9.6 mg/dL (ref 8.9–10.3)
Chloride: 108 mmol/L (ref 98–111)
Creatinine, Ser: 1.47 mg/dL — ABNORMAL HIGH (ref 0.61–1.24)
GFR, Estimated: 51 mL/min — ABNORMAL LOW (ref >60)
Glucose, Bld: 123 mg/dL — ABNORMAL HIGH (ref 70–99)
Potassium: 5.6 mmol/L — ABNORMAL HIGH (ref 3.5–5.1)
Sodium: 140 mmol/L (ref 135–145)

## 2020-07-29 MED ORDER — CARVEDILOL 3.125 MG PO TABS: 3.1250 mg | ORAL_TABLET | Freq: Two times a day (BID) | ORAL | 1 refills | Status: DC

## 2020-07-29 MED ORDER — CLOPIDOGREL BISULFATE 75 MG PO TABS: 75.0000 mg | ORAL_TABLET | Freq: Every day | ORAL | 1 refills | Status: DC

## 2020-07-29 MED ORDER — SACUBITRIL-VALSARTAN 49-51 MG PO TABS: 1.0000 | ORAL_TABLET | Freq: Two times a day (BID) | ORAL | 1 refills | Status: DC

## 2020-07-29 MED ORDER — FUROSEMIDE 40 MG PO TABS: 40.0000 mg | ORAL_TABLET | Freq: Every day | ORAL | 1 refills | Status: AC | PRN

## 2020-07-29 MED ORDER — EMPAGLIFLOZIN 10 MG PO TABS
10.0000 mg | ORAL_TABLET | Freq: Every day | ORAL | 1 refills | Status: DC
Start: 2020-07-29 — End: 2020-08-02

## 2020-07-29 NOTE — Telephone Encounter (Signed)
Pharmacy Transitions of Care Follow-up Telephone Call  Date of discharge: 07/23/20  Discharge Diagnosis: Stent Placement  How have you been since you were released from the hospital?  Had appointment this morning. Has been feeling weak and experiencing some acid reflux since discharge. Encouraged patient to contact MD if weakness gets any worse.   Medication changes made at discharge:      START taking: pantoprazole (PROTONIX)   STOP taking: amLODipine 2.5 MG tablet (NORVASC)  isosorbide mononitrate 120 MG 24 hr tablet (IMDUR)  losartan 100 MG tablet (COZAAR)  metoprolol tartrate 25 MG tablet (LOPRESSOR)  omeprazole 20 MG capsule (PRILOSEC)  Replaced by a similar medication. pioglitazone 45 MG tablet (ACTOS)    Medication changes verified by the patient? Yes    Medication Accessibility:  Home Pharmacy:  uses [place] pharmacy  Was the patient provided with refills on discharged medications? Rx's in [facility] have been discontinued for reorder to home pharmacy.   Have all prescriptions been transferred from [facility] to home pharmacy? N/a   Is the patient able to afford medications? Patient has [employer] Gold Plus    Medication Review:  CLOPIDOGREL (PLAVIX) Clopidogrel 75 mg once daily.  - Educated patient on expected duration of therapy of ASA 81mg  with clopidogrel.  - Advised patient of medications to avoid (NSAIDs, ASA)  - Educated that Tylenol (acetaminophen) will be the preferred analgesic to prevent risk of bleeding  - Emphasized importance of monitoring for signs and symptoms of bleeding (abnormal bruising, prolonged bleeding, nose bleeds, bleeding from gums, discolored urine, black tarry stools)  - Advised patient to alert all providers of anticoagulation therapy prior to starting a new medication or having a procedure    Follow-up Appointments:  PCP Hospital f/u appt confirmed? None currently scheduled  Specialist Hospital f/u appt confirmed? Yes Scheduled to see Dr. on  07/29/20 @ [facility]   If their condition worsens, is the pt aware to call PCP or go to the Emergency Dept.? Yes  Final Patient Assessment: Patient has refills at home pharmacy and follow up scheduled.

## 2020-07-29 NOTE — Patient Instructions (Signed)
When you run out of Farxiga STOP taking it  Start Jardiance 10 mg Daily, in place of the [place]  Refills have been sent to the [place] in [place]  Thank you for allowing [place] to provider your heart failure care after your recent hospitalization. Please follow-up with [facility] as scheduled  If you have any questions, issues, or concerns before your next appointment please call our office at [phone], opt. 2 and leave a message for the triage nurse.  Do the following things EVERYDAY: Weigh yourself in the morning before breakfast. Write it down and keep it in a log. Take your medicines as prescribed Eat low salt foods--Limit salt (sodium) to 2000 mg per day.  Stay as active as you can everyday Limit all fluids for the day to less than 2 liters

## 2020-07-30 ENCOUNTER — Telehealth (HOSPITAL_COMMUNITY): Payer: Self-pay | Admitting: Internal Medicine

## 2020-07-30 NOTE — Telephone Encounter (Signed)
Discussed with patient bmp results. BUN 30 and potassium 5.6.   He will hold entresto for a few days.  I will discuss with Dr. Tresa Endo potentially switching him back to losartan at follow up.  He does not have problems with fluid retention and I suspect the entresto and farxiga together are providing too much diuretic effect.

## 2020-08-02 ENCOUNTER — Other Ambulatory Visit: Payer: Self-pay

## 2020-08-02 ENCOUNTER — Ambulatory Visit: Payer: Medicare PPO | Admitting: Cardiovascular Disease

## 2020-08-02 ENCOUNTER — Encounter: Payer: Self-pay | Admitting: Cardiovascular Disease

## 2020-08-02 VITALS — BP 118/72 | HR 69 | Ht 67.0 in | Wt 183.4 lb

## 2020-08-02 DIAGNOSIS — I255 Ischemic cardiomyopathy: Secondary | ICD-10-CM | POA: Diagnosis not present

## 2020-08-02 DIAGNOSIS — I25110 Atherosclerotic heart disease of native coronary artery with unstable angina pectoris: Secondary | ICD-10-CM

## 2020-08-02 DIAGNOSIS — Z794 Long term (current) use of insulin: Secondary | ICD-10-CM | POA: Diagnosis not present

## 2020-08-02 DIAGNOSIS — E118 Type 2 diabetes mellitus with unspecified complications: Secondary | ICD-10-CM | POA: Diagnosis not present

## 2020-08-02 DIAGNOSIS — E785 Hyperlipidemia, unspecified: Secondary | ICD-10-CM | POA: Diagnosis not present

## 2020-08-02 MED ORDER — CARVEDILOL 6.25 MG PO TABS: 6.2500 mg | ORAL_TABLET | Freq: Two times a day (BID) | ORAL | 3 refills | Status: DC

## 2020-08-02 NOTE — Patient Instructions (Signed)
Medication Instructions:  INCREASE carvedilol to 6.25mg  (1 tablet) twice daily.  *If you need a refill on your cardiac medications before your next appointment, please call your pharmacy*   Lab Work: CMET, CBC, BNP  TOMORROW.  If you have labs (blood work) drawn today and your tests are completely normal, you will receive your results only by: MyChart Message (if you have MyChart) OR A paper copy in the mail If you have any lab test that is abnormal or we need to change your treatment, we will call you to review the results.   Testing/Procedures: None ordered.    Follow-Up: At [facility], you and your health needs are our priority.  As part of our continuing mission to provide you with exceptional heart care, we have created designated Provider Care Teams.  These Care Teams include your primary Cardiologist (physician) and Advanced Practice Providers (APPs -  Physician Assistants and Nurse Practitioners) who all work together to provide you with the care you need, when you need it.  We recommend signing up for the patient portal called "MyChart".  Sign up information is provided on this After Visit Summary.  MyChart is used to connect with patients for Virtual Visits (Telemedicine).  Patients are able to view lab/test results, encounter notes, upcoming appointments, etc.  Non-urgent messages can be sent to your provider as well.   To learn more about what you can do with MyChart, go to [email or web address].    Your next appointment:   2 week(s)  The format for your next appointment:   In Person  Provider:   Dr. Tresa Endo or PA.

## 2020-08-02 NOTE — Progress Notes (Signed)
Cardiology Office Note    Date:  08/02/2020   ID:  Gregory Cole, DOB December 14, 1950, MRN [id number]  PCP:  Loman Brooklyn, FNP  Cardiologist:  Shelva Majestic, MD   Initial office visit with me following his recent STEMI.   History of Present Illness:  Gregory Cole is a 70 y.o. male who has a history of CAD, hypertension, hyperlipidemia and type 2 diabetes mellitus.  In November 2010 he underwent cardiac catheterization by me which showed old total occlusion of his mid LAD with collateralization both were larger he was treated vessel and dominant RCA.  The ramus vessel had 99% stenosis and he underwent successful stenting of his ramus branch.  He was followed at the [facility] and had not seen a cardiologist in years but presented in March 2021 with chest pain symptomatology worrisome for unstable angina.  He ruled in for non-STEMI with peak troponin 1079.  Catheterization was performed by me on April 06, 2019 and he was found to have three-vessel CAD with his old total mid LAD occlusion proximal to mid narrowing of 20 to 30%.  The stent to the proximal ramus intermediate vessel was widely patent.  The circumflex was normal.  The RCA had proximal irregularity of 30% with diffuse 90% eccentric stenosis in the midportion of the RCA as well as focal 70% PDA stenosis followed by 50% mid stenosis with 50% stenosis in mid inferior LV branch.  He underwent successful stenting to his mid RCA with insertion of a 3.5 x 22 mm Resolute Onyx stent postdilated to 3.6 mm.  Patient subsequently did very well and represented to [facility] on July 20, 2020 in the setting of an acute inferior ST segment elevation myocardial infarction.  There was some delay in transport since the ambulance had flat tires and he needed to wait for another ambulance to arrive.  He was taken emergently to the catheterization laboratory by Dr. Tamala Julian and was found to have proximal RCA occlusion which was successfully stented with a 3.5 x 18 mm  Resolute stent postdilated to 3.75 mm.  The stent was placed in overlapping fashion to the previously placed RCA stent which was widely patent.  There was no change in the occluded mid LAD and the proximal to mid LAD had moderate atherosclerosis and calcification.  The stent in the large ramus branch was widely patent.  Circumflex vessel had mild generalized atherosclerosis without obstructive disease.  Left ventriculography showed an EF reduced at 35 to 40% and subsequent MRI showed mildly decreased LV function with EF at 43% with probable nonviable apex with possible some viability in the inferior wall.  During his hospitalization, he received a dose of IV Lasix during admission.  He was started on guideline directed medical therapy and was switched from losartan to Entresto 49/51 mg twice a day, Farxiga 10 mg daily, carvedilol 3.125 mg twice a day and as needed Lasix.  He has not used Lasix therapy.  He was seen by Dr. Shan Levans in heart and vascular transition of care clinic on July 29, 2020.  Laboratory was drawn, his potassium was elevated at 5.7 for which she was notified and he has held his Delene Loll since July 23.  Upon questioning he admits that he was eating a fair amount of tomatoes.  He denies recurrent chest pain symptoms.  He is unaware of palpitations.  He presents for his initial office evaluation with me.    Past Medical History:  Diagnosis Date   Coronary  artery disease    Diabetes mellitus without complication (HCC)    GERD (gastroesophageal reflux disease)    Glaucoma    Hypercholesteremia    Hypertension    Lesion of lung    [facility]   Myocardial infarction ([place])    x3   Postablative hypothyroidism 03/18/2020   Sleep apnea    unable to tolerate CPAP    Past Surgical History:  Procedure Laterality Date   BOWEL RESECTION     CARDIAC CATHETERIZATION     2 stents in heart   CATARACT EXTRACTION W/PHACO Left 07/01/2013   Procedure: CATARACT EXTRACTION  PHACO AND INTRAOCULAR LENS PLACEMENT ([place]);  Surgeon: Elta Guadeloupe T. Gershon Crane, MD;  Location: [facility];  Service: Ophthalmology;  Laterality: Left;  CDE 6.84   CORONARY STENT INTERVENTION N/A 04/07/2019   Procedure: CORONARY STENT INTERVENTION;  Surgeon: Troy Sine, MD;  Location: [facility]; mRCA 80% -> STENT RESOLUTE ONYX 3.5X22 (3.6 mm)   CORONARY/GRAFT ACUTE MI REVASCULARIZATION N/A 07/20/2020   Procedure: Coronary/Graft Acute MI Revascularization - Coronary Stent Intervention;  Surgeon: Belva Crome, MD;  Location: [facility];  Service: Cardiovascular;; proximal RCA 100% occlusion (proximal to previous stent)-3.5 mm 18 mm Onyx Frontier DES (3.75 mm)-overlaps previous stent.   HAND SURGERY Left    LEFT HEART CATH AND CORONARY ANGIOGRAPHY N/A 04/07/2019   Procedure: LEFT HEART CATH AND CORONARY ANGIOGRAPHY;  Surgeon: Troy Sine, MD;  Location: [facility];  Service: Cardiovascular;  Laterality: N/A;   LEFT HEART CATH AND CORONARY ANGIOGRAPHY N/A 07/20/2020   Procedure: LEFT HEART CATH AND CORONARY ANGIOGRAPHY;  Surgeon: Belva Crome, MD;;  Cultprit Lesion: 100% Thrombotic proximal RCA occlusion, mid RCA stent.  (DES PCI-3.5 mm x18 mm -3.75 mm, overlapping mid RCA stent). tandem 70% & 50% PDA lesions, 50% PL 1.  Collaterals from distal RCA-> LAD. Patent stent in the RI.  20% ostial & imd LAD -> mid LAD severe diffuse disease followed by CTO.   NASAL SEPTUM SURGERY     SHOULDER ARTHROSCOPY Right    TRANSTHORACIC ECHOCARDIOGRAM  07/21/2020   (Post inferior STEMI) moderately reduced LVEF 30 to 35%.  Apical aneurysm with inferior akinesis and inferolateral/inferoseptal/severe HK.  GR 1 DD.  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  Normal RV, unable to assess pressures.  Mild aortic sclerosis but no stenosis.  Elevated RAP/CVP of estimated 15 mmHg with dilated IVC.    Current Medications: Outpatient Medications  Prior to Visit  Medication Sig Dispense Refill   aspirin EC 81 MG tablet Take 81 mg by mouth daily. Evening 1600     atorvastatin (LIPITOR) 80 MG tablet Take 1 tablet (80 mg total) by mouth daily at 6 PM. 30 tablet 0   clopidogrel (PLAVIX) 75 MG tablet Take 1 tablet (75 mg total) by mouth daily. 30 tablet 1   dapagliflozin propanediol (FARXIGA) 10 MG TABS tablet Take by mouth daily.     furosemide (LASIX) 40 MG tablet Take 1 tablet (40 mg total) by mouth daily as needed (For weight gain > 3 lbs overnight.). 30 tablet 1   insulin aspart protamine - aspart (NOVOLOG MIX 70/30 FLEXPEN) (70-30) 100 UNIT/ML FlexPen Inject 36 Units into the skin daily. Before dinner.     levothyroxine (SYNTHROID, LEVOTHROID) 88 MCG tablet Take 88 mcg by mouth daily before breakfast.     metFORMIN (GLUCOPHAGE) 850 MG  tablet Take 850 mg by mouth 2 (two) times daily with a meal.     Omega-3 Fatty Acids (FISH OIL PO) Take 1,000 mg by mouth 2 (two) times daily.     pantoprazole (PROTONIX) 40 MG tablet Take 1 tablet (40 mg total) by mouth daily. 30 tablet 11   sacubitril-valsartan (ENTRESTO) 49-51 MG Take 1 tablet by mouth 2 (two) times daily. 60 tablet 1   carvedilol (COREG) 3.125 MG tablet Take 1 tablet (3.125 mg total) by mouth 2 (two) times daily with a meal. 60 tablet 1   empagliflozin (JARDIANCE) 10 MG TABS tablet Take 1 tablet (10 mg total) by mouth daily before breakfast. 30 tablet 1   diclofenac Sodium (VOLTAREN) 1 % GEL Apply 4 g topically 4 (four) times daily as needed (knee pain). (Patient not taking: Reported on 08/02/2020)     guaiFENesin (ROBITUSSIN) 100 MG/5ML liquid Take 200 mg by mouth 3 (three) times daily as needed for cough. (Patient not taking: Reported on 08/02/2020)     nitroGLYCERIN (NITROSTAT) 0.4 MG SL tablet Place under the tongue. (Patient not taking: Reported on 08/02/2020)     No facility-administered medications prior to visit.     Allergies:   Patient has no known allergies.   Social History    Socioeconomic History   Marital status: Married    Spouse name: Not on file   Number of children: 1   Years of education: Not on file   Highest education level: Not on file  Occupational History   Not on file  Tobacco Use   Smoking status: Former    Packs/day: 2.00    Years: 40.00    Pack years: 80.00    Types: Cigarettes   Smokeless tobacco: Never  Vaping Use   Vaping Use: Never used  Substance and Sexual Activity   Alcohol use: Yes    Comment: occasional glass of wine   Drug use: No   Sexual activity: Not Currently  Other Topics Concern   Not on file  Social History Narrative   Lives with his wife - first floor of apartment complex   Social Determinants of Health   Financial Resource Strain: Low Risk    Difficulty of Paying Living Expenses: Not hard at all  Food Insecurity: No Food Insecurity   Worried About Charity fundraiser in the Last Year: Never true   [place] in the Last Year: Never true  Transportation Needs: No Transportation Needs   Lack of Transportation (Medical): No   Lack of Transportation (Non-Medical): No  Physical Activity: Sufficiently Active   Days of Exercise per Week: 7 days   Minutes of Exercise per Session: 30 min  Stress: No Stress Concern Present   Feeling of Stress : Not at all  Social Connections: Moderately Integrated   Frequency of Communication with Friends and Family: Once a week   Frequency of Social Gatherings with Friends and Family: Once a week   Attends Religious Services: More than 4 times per year   Active Member of [employer] or Organizations: Yes   Attends Music therapist: More than 4 times per year   Marital Status: Married     Family History:  The patient's family history includes Heart disease in his father; Stroke in his mother.   ROS General: Negative; No fevers, chills, or night sweats;  HEENT: Negative; No changes in vision or hearing, sinus congestion, difficulty swallowing Pulmonary:  Negative; No cough, wheezing, shortness of breath,  hemoptysis Cardiovascular: Negative; No chest pain, presyncope, syncope, palpitations GI: Negative; No nausea, vomiting, diarrhea, or abdominal pain GU: Negative; No dysuria, hematuria, or difficulty voiding Musculoskeletal: Negative; no myalgias, joint pain, or weakness Hematologic/Oncology: Negative; no easy bruising, bleeding Endocrine: Negative; no heat/cold intolerance; no diabetes Neuro: Negative; no changes in balance, headaches Skin: Negative; No rashes or skin lesions Psychiatric: Negative; No behavioral problems, depression Sleep: Negative; No snoring, daytime sleepiness, hypersomnolence, bruxism, restless legs, hypnogognic hallucinations, no cataplexy Other comprehensive 14 point system review is negative.   PHYSICAL EXAM:   VS:  BP 118/72 (BP Location: Left Arm, Patient Position: Sitting, Cuff Size: Normal)   Pulse 69   Ht _0  (1.702 m)   Wt 183 lb 6.4 oz (83.2 kg)   SpO2 99%   BMI 28.72 kg/m     Repeat blood pressure by me was 110/70  Wt Readings from Last 3 Encounters:  08/02/20 183 lb 6.4 oz (83.2 kg)  07/29/20 183 lb 6.4 oz (83.2 kg)  07/20/20 187 lb 6.3 oz (85 kg)    General: Alert, oriented, no distress.  Skin: normal turgor, no rashes, warm and dry HEENT: Normocephalic, atraumatic. Pupils equal round and reactive to light; sclera anicteric; extraocular muscles intact;  Nose without nasal septal hypertrophy Mouth/Parynx benign; Mallinpatti scale  Neck: No JVD, no carotid bruits; normal carotid upstroke Lungs: clear to ausculatation and percussion; no wheezing or rales Chest wall: without tenderness to palpitation Heart: PMI not displaced, RRR, s1 s2 normal, 1/6 systolic murmur, no diastolic murmur, no rubs, gallops, thrills, or heaves Abdomen: soft, nontender; no hepatosplenomehaly, BS+; abdominal aorta nontender and not dilated by palpation. Back: no CVA tenderness Pulses 2+ Musculoskeletal: full range  of motion, normal strength, no joint deformities Extremities: no clubbing cyanosis or edema, Homan's sign negative  Neurologic: grossly nonfocal; Cranial nerves grossly wnl Psychologic: Normal mood and affect   Studies/Labs Reviewed:   EKG:  EKG is ordered today.  ECG (independently read by me): Normal sinus rhythm at 69 bpm, left axis deviation, inferior Q waves with Q waves across the precordium system with his recent inferior MI and mid LAD occlusion.  Recent Labs: BMP Latest Ref Rng & Units 07/29/2020 07/23/2020 07/22/2020  Glucose 70 - 99 mg/dL 123(H) 161(H) 100(H)  BUN 8 - 23 mg/dL 30(H) 20 20  Creatinine 0.61 - 1.24 mg/dL 1.47(H) 1.43(H) 1.56(H)  BUN/Creat Ratio 10 - 24 - - -  Sodium 135 - 145 mmol/L 140 137 140  Potassium 3.5 - 5.1 mmol/L 5.6(H) 3.4(L) 4.1  Chloride 98 - 111 mmol/L 108 108 108  CO2 22 - 32 mmol/L _1 Calcium 8.9 - 10.3 mg/dL 9.6 8.8(L) 9.1     Hepatic Function Latest Ref Rng & Units 07/23/2020 07/22/2020 03/18/2020  Total Protein 6.5 - 8.1 g/dL 6.4(L) 6.2(L) 7.5  Albumin 3.5 - 5.0 g/dL 3.1(L) 3.2(L) 4.2  AST 15 - 41 U/L 69(H) 145(H) 19  ALT 0 - 44 U/L 24 33 14  Alk Phosphatase 38 - 126 U/L 56 56 91  Total Bilirubin 0.3 - 1.2 mg/dL 0.6 0.5 0.6  Bilirubin, Direct 0.00 - 0.40 mg/dL - - -    CBC Latest Ref Rng & Units 07/23/2020 07/22/2020 07/21/2020  WBC 4.0 - 10.5 K/uL 7.4 9.0 10.2  Hemoglobin 13.0 - 17.0 g/dL 11.2(L) 10.8(L) 12.5(L)  Hematocrit 39.0 - 52.0 % 33.7(L) 34.2(L) 39.4  Platelets 150 - 400 K/uL 162 164 180   Lab Results  Component Value Date   MCV  87.8 07/23/2020   MCV 89.1 07/22/2020   MCV 90.2 07/21/2020   Lab Results  Component Value Date   TSH 1.003 07/21/2020   Lab Results  Component Value Date   HGBA1C 7.2 (H) 07/22/2020     BNP    Component Value Date/Time   BNP 55.0 04/05/2019 1139    ProBNP    Component Value Date/Time   PROBNP 97.0 11/13/2008 0341     Lipid Panel     Component Value Date/Time   CHOL 108  07/21/2020 0437   CHOL 122 03/18/2020 1002   TRIG 81 07/21/2020 0437   HDL 30 (L) 07/21/2020 0437   HDL 32 (L) 03/18/2020 1002   CHOLHDL 3.6 07/21/2020 0437   VLDL 16 07/21/2020 0437   LDLCALC 62 07/21/2020 0437   LDLCALC 68 03/18/2020 1002   LABVLDL 22 03/18/2020 1002     RADIOLOGY: CARDIAC CATHETERIZATION  Result Date: 07/20/2020  Acute inferior ST elevation MI secondary to plaque rupture with acute occlusion.  Total occlusion of the proximal RCA is reduced to 0% with TIMI grade III flow after stenting with an 18 x 3.5 mm stent postdilated to 3.75 mm.  The stent slightly overlaps the stent already present in the mid right coronary.  Widely patent left main  LAD is totally occluded in the mid to distal segment with left to right and right to left collaterals that are faint.  Proximal to mid LAD contain moderate atherosclerosis and calcification.  Large ramus branch is widely patent including the proximal to mid stent.  Circumflex gives origin to 3 obtuse marginal branches and has mild generalized atherosclerosis but no obstructive disease.  Left ventriculography demonstrates apical dyskinesis and possible LV thrombus.  Inferior wall and basal segment are hypokinetic.  EF 35 to 40%.  LVEDP 26 mmHg after ventriculography.  13 to 15 mmHg before ventriculography. RECOMMENDATIONS:  Complete 1 year of dual antiplatelet therapy.  Aggressive risk factor modification.  Further management per treating team  MR CARDIAC MORPHOLOGY W WO CONTRAST  Result Date: 07/22/2020 CLINICAL DATA:  Clinical question of ventricular function Study assumes HCT of 34 EXAM: CARDIAC MRI TECHNIQUE: The patient was scanned on a 1.5 Tesla GE magnet. A dedicated cardiac coil was used. Functional imaging was done using Fiesta sequences. 2,3, and 4 chamber views were done to assess for RWMA's. Modified Simpson's rule using a short axis stack was used to calculate an ejection fraction on a dedicated work Brewing technologist. The patient received 10 cc of Gadavist. After 10 minutes inversion recovery sequences were used to assess for infiltration and scar tissue. CONTRAST:  10 cc  of Gadavist FINDINGS: 1. Normal left ventricular size, with LVEDD 53 mm, and LVEDVi 83 mL/m2. Normal left ventricular thickness, with intraventricular septal thickness of 9 mm, posterior wall thickness of 7 mm, and myocardial mass 63 g/m2. Normal left ventricular systolic function (LVEF =07%). There is basal, mid, and apical inferior septal and inferior hypokinesis; there is a notable apical aneurysm. There is no thrombus. Left ventricular parametric mapping notable for significant increase in ECV signal in the apex (60% at highest), with moderate increase in ECV in the mid and basal inferoseptal, inferior, inferolateral (35-45%), and elevated T2 signal in the apex. There is late gadolinium enhancement in the left ventricular myocardium: Inferior, basal, endomyocardial (up to 50% myocardium), Inferior, mid, endomyocardial (up to 75% myocardium), apical, transmural in all segments. 2. Normal right ventricular size with RVEDVI 62 mL/m2. Normal right ventricular thickness. Normal  right ventricular systolic function (RVEF =33%). There are no regional wall motion abnormalities or aneurysms. 3.  Normal left and right atrial size. 4. Normal size of the aortic root, ascending aorta and pulmonary artery for age and gender. 5.  No significant valvular abnormalities. 6.  Normal pericardium.  No pericardial effusion. 7. Grossly, no extracardiac findings. Recommended dedicated study if concerned for non-cardiac pathology. IMPRESSION: Mildly decreased LV function, LVEF 43%. 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 Parametric mapping suggestive of myocardial infarction. Apex does not appear viable. Inferior territory may be viable (less so for inferior mid). Rudean Haskell MD Electronically Signed   By: Rudean Haskell MD   On:  07/22/2020 16:04   ECHOCARDIOGRAM COMPLETE  Result Date: 07/21/2020    ECHOCARDIOGRAM REPORT   Patient Name:   Gregory Cole Date of Exam: 07/21/2020 Medical Rec #:  [id number]       Height:       67.0 in Accession #:    [id number]      Weight:       187.4 lb Date of Birth:  1950/05/08      BSA:          1.967 m Patient Age:    54 years        BP:           132/77 mmHg Patient Gender: M               HR:           68 bpm. Exam Location:  Inpatient Procedure: 2D Echo, Color Doppler and Cardiac Doppler Indications:    O11.57 Acute systolic (congestive) heart failure  History:        Patient has prior history of Echocardiogram examinations, most                 recent 04/06/2019. CAD; Risk Factors:Hypertension, Diabetes and                 Dyslipidemia.  Sonographer:    Raquel Sarna Senior RDCS Referring Phys: [place]  1. Left ventricular ejection fraction, by estimation, is 30 to 35%. The left ventricle has moderately decreased function. The left ventricle demonstrates regional wall motion abnormalities the apical aneurysm, inferior akinesis, and inferolateral/inferoseptal severe hypokinesis. Left ventricular diastolic parameters are consistent with Grade I diastolic dysfunction (impaired relaxation). 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  2. Right ventricular systolic function is normal. The right ventricular size is normal. Tricuspid regurgitation signal is inadequate for assessing PA pressure.  3. The mitral valve is normal in structure. No evidence of mitral valve regurgitation. No evidence of mitral stenosis.  4. The aortic valve is tricuspid. Aortic valve regurgitation is not visualized. Mild aortic valve sclerosis is present, with no evidence of aortic valve stenosis.  5. The inferior vena cava is dilated in size with <50% respiratory variability, suggesting right atrial pressure of 15 mmHg.  6. Suspect apical thrombus but not completely clear with Definity. Would  consider cardiac MRI to make firm diagnosis. FINDINGS  Left Ventricle: Left ventricular ejection fraction, by estimation, is 30 to 35%. The left ventricle has moderately decreased function. The left ventricle demonstrates regional wall motion abnormalities. The left ventricular internal cavity size was normal in size. There is mild left ventricular hypertrophy. Left ventricular diastolic parameters are consistent with Grade I diastolic dysfunction (impaired relaxation). Right Ventricle: The right ventricular size is normal. No increase in  right ventricular wall thickness. Right ventricular systolic function is normal. Tricuspid regurgitation signal is inadequate for assessing PA pressure. Left Atrium: Left atrial size was normal in size. Right Atrium: Right atrial size was normal in size. Pericardium: There is no evidence of pericardial effusion. Mitral Valve: The mitral valve is normal in structure. Mild mitral annular calcification. No evidence of mitral valve regurgitation. No evidence of mitral valve stenosis. Tricuspid Valve: The tricuspid valve is normal in structure. Tricuspid valve regurgitation is not demonstrated. Aortic Valve: The aortic valve is tricuspid. Aortic valve regurgitation is not visualized. Mild aortic valve sclerosis is present, with no evidence of aortic valve stenosis. Pulmonic Valve: The pulmonic valve was normal in structure. Pulmonic valve regurgitation is not visualized. Aorta: The aortic root is normal in size and structure. Venous: The inferior vena cava is dilated in size with less than 50% respiratory variability, suggesting right atrial pressure of 15 mmHg. IAS/Shunts: No atrial level shunt detected by color flow Doppler.  LEFT VENTRICLE PLAX 2D LVIDd:         5.00 cm  Diastology LVIDs:         4.00 cm  LV e' medial:    5.11 cm/s LV PW:         1.20 cm  LV E/e' medial:  10.7 LV IVS:        1.10 cm  LV e' lateral:   6.74 cm/s LVOT diam:     2.00 cm  LV E/e' lateral: 8.1 LV SV:          61 LV SV Index:   31 LVOT Area:     3.14 cm  RIGHT VENTRICLE RV S prime:     10.30 cm/s TAPSE (M-mode): 2.0 cm LEFT ATRIUM             Index       RIGHT ATRIUM           Index LA diam:        3.30 cm 1.68 cm/m  RA Area:     15.20 cm LA Vol (A2C):   44.3 ml 22.52 ml/m RA Volume:   37.80 ml  19.22 ml/m LA Vol (A4C):   41.5 ml 21.10 ml/m LA Biplane Vol: 44.6 ml 22.67 ml/m  AORTIC VALVE LVOT Vmax:   89.60 cm/s LVOT Vmean:  60.800 cm/s LVOT VTI:    0.194 m  AORTA Ao Root diam: 3.00 cm Ao Asc diam:  3.50 cm MITRAL VALVE MV Area (PHT): 2.37 cm    SHUNTS MV Decel Time: 320 msec    Systemic VTI:  0.19 m MV E velocity: 54.70 cm/s  Systemic Diam: 2.00 cm MV A velocity: 92.90 cm/s MV E/A ratio:  0.59 Loralie Champagne MD Electronically signed by Loralie Champagne MD Signature Date/Time: 07/21/2020/12:02:47 PM    Final      Additional studies/ records that were reviewed today include:   CARDIAC CATH/PCI: 07/20/2020 Acute inferior ST elevation MI secondary to plaque rupture with acute occlusion. Total occlusion of the proximal RCA is reduced to 0% with TIMI grade III flow after stenting with an 18 x 3.5 mm stent postdilated to 3.75 mm.  The stent slightly overlaps the stent already present in the mid right coronary. Widely patent left main LAD is totally occluded in the mid to distal segment with left to right and right to left collaterals that are faint.  Proximal to mid LAD contain moderate atherosclerosis and calcification. Large ramus branch is widely patent including the proximal  to mid stent. Circumflex gives origin to 3 obtuse marginal branches and has mild generalized atherosclerosis but no obstructive disease. Left ventriculography demonstrates apical dyskinesis and possible LV thrombus.  Inferior wall and basal segment are hypokinetic.  EF 35 to 40%.  LVEDP 26 mmHg after ventriculography.  13 to 15 mmHg before ventriculography.   RECOMMENDATIONS:   Complete 1 year of dual antiplatelet  therapy. Aggressive risk factor modification. Further management per treating team    Intervention   ASSESSMENT:    1. Ischemic cardiomyopathy   2. Coronary artery disease involving native coronary artery of native heart with unstable angina pectoris ([place])   3. Type 2 diabetes mellitus with complication, with long-term current use of insulin ([place])   4. Hyperlipidemia with target LDL less than 70     PLAN:  Gregory Cole is a 70 year old gentleman with known CAD status postinitial intervention in 2010 involving his  ramus intermediate vessel.  He had total occlusion of his mid LAD.  He subsequently developed a non-ST segment elevation myocardial infarction in March 2021 and underwent stenting of his mid RCA and his stent to the ramus vessel was widely patent.  He has been followed at the [place] and not been seen in our office.  He recently was admitted in the setting of an acute inferior myocardial infarction with some delay in presentation due to flat tires in the ambulance.  He was found to have total proximal RCA occlusion which was successfully stented with overlapping of his previous mid RCA stent.  He has an ischemic cardiomyopathy and on MRI imaging EF was 43% with akinetic nonviable apex without thrombus.  There was some suggestion of possible viability and a portion of the inferior wall.  During his hospitalization he was transitioned from losartan to [facility] and was started on SGLT2 inhibition with [place].  Apparently the patient has been eating a lot of tomatoes.  Laboratory on July 21 showed a potassium of 5.7.  He was notified following day and he is not taking Entresto since his last dose on July 22.  Presently, our laboratory is closed to recheck laboratory today.  I have recommended tomorrow he obtain laboratory this can be done in [place] at his primary care facility.  He continues to be on SGLT2 inhibition with Wilder Glade but apparently this may ultimately be switched to Jardiance  if he is to be followed at the [place] since this is on their formulary.  He is on DAPT and my recommendation would be that he continue this indefinitely.  He is on insulin, metformin in addition to [place] for his diabetes mellitus.  He has been on carvedilol 3.125 mg twice a day, and with him holding Entresto, I have suggested slight titration of carvedilol to 6.25 mg twice a day.  He is on atorvastatin 80 mg daily for hyperlipidemia with target LDL less than 70.  He is on Protonix for GERD.   He will be notified regarding the results of his laboratory.  If potassium normalizes he may be able to reinitiate low-dose ARB with improved diet.  I have recommended follow-up in 2 weeks for reevaluation or sooner as needed.   Medication Adjustments/Labs and Tests Ordered: Current medicines are reviewed at length with the patient today.  Concerns regarding medicines are outlined above.  Medication changes, Labs and Tests ordered today are listed in the Patient Instructions below. Patient Instructions  Medication Instructions:  INCREASE carvedilol to 6.57m (1 tablet) twice daily.  *If you need a  refill on your cardiac medications before your next appointment, please call your pharmacy*   Lab Work: CMET, CBC, BNP  TOMORROW.  If you have labs (blood work) drawn today and your tests are completely normal, you will receive your results only by: [place] (if you have MyChart) OR A paper copy in the mail If you have any lab test that is abnormal or we need to change your treatment, we will call you to review the results.   Testing/Procedures: None ordered.    Follow-Up: At [facility], you and your health needs are our priority.  As part of our continuing mission to provide you with exceptional heart care, we have created designated Provider Care Teams.  These Care Teams include your primary Cardiologist (physician) and Advanced Practice Providers (APPs -  Physician Assistants and Nurse  Practitioners) who all work together to provide you with the care you need, when you need it.  We recommend signing up for the patient portal called "MyChart".  Sign up information is provided on this After Visit Summary.  MyChart is used to connect with patients for Virtual Visits (Telemedicine).  Patients are able to view lab/test results, encounter notes, upcoming appointments, etc.  Non-urgent messages can be sent to your provider as well.   To learn more about what you can do with MyChart, go to [email or web address].    Your next appointment:   2 week(s)  The format for your next appointment:   In Person  Provider:   Dr. Claiborne Billings or PA.     Signed, Shelva Majestic, MD  08/02/2020 6:50 PM    [facility] [address] [place], [place],   [address] Phone: [phone]

## 2020-08-02 NOTE — Telephone Encounter (Signed)
I saw Gregory Cole today in the office.  He has been off Entresto on Saturday and Sunday.  I have recommended follow-up laboratory tomorrow with a comprehensive metabolic panel, CBC and BMP.  I have recommended he stay off Entresto for now most likely need to be off of losartan.  Apparently he had been eating a fair amount of tomatoes and other potassium to containing foods.

## 2020-08-03 ENCOUNTER — Other Ambulatory Visit: Payer: Medicare PPO

## 2020-08-03 DIAGNOSIS — I255 Ischemic cardiomyopathy: Secondary | ICD-10-CM | POA: Diagnosis not present

## 2020-08-04 LAB — COMPREHENSIVE METABOLIC PANEL
ALT: 16 IU/L (ref 0–44)
AST: 20 IU/L (ref 0–40)
Albumin/Globulin Ratio: 1.5 (ref 1.2–2.2)
Albumin: 4.4 g/dL (ref 3.7–4.7)
Alkaline Phosphatase: 70 IU/L (ref 44–121)
BUN/Creatinine Ratio: 14 (ref 10–24)
BUN: 19 mg/dL (ref 8–27)
Bilirubin Total: 0.3 mg/dL (ref 0.0–1.2)
CO2: 24 mmol/L (ref 20–29)
Calcium: 9.7 mg/dL (ref 8.6–10.2)
Chloride: 104 mmol/L (ref 96–106)
Creatinine, Ser: 1.32 mg/dL — ABNORMAL HIGH (ref 0.76–1.27)
Globulin, Total: 2.9 g/dL (ref 1.5–4.5)
Glucose: 124 mg/dL — ABNORMAL HIGH (ref 65–99)
Potassium: 5 mmol/L (ref 3.5–5.2)
Sodium: 141 mmol/L (ref 134–144)
Total Protein: 7.3 g/dL (ref 6.0–8.5)
eGFR: 58 mL/min/{1.73_m2} — ABNORMAL LOW (ref >59)

## 2020-08-04 LAB — BRAIN NATRIURETIC PEPTIDE: BNP: 217.9 pg/mL — ABNORMAL HIGH (ref 0.0–100.0)

## 2020-08-04 LAB — CBC
Hematocrit: 37.9 % (ref 37.5–51.0)
Hemoglobin: 12.1 g/dL — ABNORMAL LOW (ref 13.0–17.7)
MCH: 27.9 pg (ref 26.6–33.0)
MCHC: 31.9 g/dL (ref 31.5–35.7)
MCV: 88 fL (ref 79–97)
Platelets: 318 10*3/uL (ref 150–450)
RBC: 4.33 x10E6/uL (ref 4.14–5.80)
RDW: 14.5 % (ref 11.6–15.4)
WBC: 7.6 10*3/uL (ref 3.4–10.8)

## 2020-08-20 ENCOUNTER — Ambulatory Visit: Payer: Medicare PPO | Admitting: Cardiovascular Disease

## 2020-09-03 ENCOUNTER — Other Ambulatory Visit (HOSPITAL_COMMUNITY): Payer: Self-pay

## 2020-12-30 ENCOUNTER — Ambulatory Visit: Payer: Medicare PPO | Admitting: Cardiovascular Disease

## 2021-04-28 DIAGNOSIS — E079 Disorder of thyroid, unspecified: Secondary | ICD-10-CM | POA: Diagnosis not present

## 2021-04-28 DIAGNOSIS — R531 Weakness: Secondary | ICD-10-CM | POA: Diagnosis not present

## 2021-04-28 DIAGNOSIS — R9431 Abnormal electrocardiogram [ECG] [EKG]: Secondary | ICD-10-CM | POA: Diagnosis not present

## 2021-04-28 DIAGNOSIS — R1110 Vomiting, unspecified: Secondary | ICD-10-CM | POA: Diagnosis not present

## 2021-04-28 DIAGNOSIS — I10 Essential (primary) hypertension: Secondary | ICD-10-CM | POA: Diagnosis not present

## 2021-04-28 DIAGNOSIS — E119 Type 2 diabetes mellitus without complications: Secondary | ICD-10-CM | POA: Diagnosis not present

## 2021-04-28 DIAGNOSIS — W19XXXA Unspecified fall, initial encounter: Secondary | ICD-10-CM | POA: Diagnosis not present

## 2021-04-28 DIAGNOSIS — Z955 Presence of coronary angioplasty implant and graft: Secondary | ICD-10-CM | POA: Diagnosis not present

## 2021-04-28 DIAGNOSIS — R61 Generalized hyperhidrosis: Secondary | ICD-10-CM | POA: Diagnosis not present

## 2021-04-28 DIAGNOSIS — I959 Hypotension, unspecified: Secondary | ICD-10-CM | POA: Diagnosis not present

## 2021-04-28 DIAGNOSIS — E785 Hyperlipidemia, unspecified: Secondary | ICD-10-CM | POA: Diagnosis not present

## 2021-04-28 DIAGNOSIS — R55 Syncope and collapse: Secondary | ICD-10-CM | POA: Diagnosis not present

## 2021-04-28 DIAGNOSIS — R159 Full incontinence of feces: Secondary | ICD-10-CM | POA: Diagnosis not present

## 2021-04-28 DIAGNOSIS — I2510 Atherosclerotic heart disease of native coronary artery without angina pectoris: Secondary | ICD-10-CM | POA: Diagnosis not present

## 2021-05-02 ENCOUNTER — Encounter: Payer: Self-pay | Admitting: Family Medicine

## 2021-05-02 ENCOUNTER — Ambulatory Visit (INDEPENDENT_AMBULATORY_CARE_PROVIDER_SITE_OTHER): Payer: Medicare HMO | Admitting: Family Medicine

## 2021-05-02 DIAGNOSIS — U071 COVID-19: Secondary | ICD-10-CM

## 2021-05-02 MED ORDER — MOLNUPIRAVIR EUA 200MG CAPSULE: 4.0000 | ORAL_CAPSULE | Freq: Two times a day (BID) | ORAL | 0 refills | Status: AC

## 2021-05-02 NOTE — Progress Notes (Signed)
? ?Virtual Visit via telephone Note ? ?I connected with Gregory Cole on 05/02/21 at 0944 by telephone and verified that I am speaking with the correct person using two identifiers. Gregory Cole is currently located at home and  wife Gregory Cole  are currently with her during visit. The provider, Elige Radon Kaydyn Sayas, MD is located in their office at time of visit. ? ?Call ended at [phone] ? ?I discussed the limitations, risks, security and privacy concerns of performing an evaluation and management service by telephone and the availability of in person appointments. I also discussed with the patient that there may be a patient responsible charge related to this service. The patient expressed understanding and agreed to proceed. ? ? ?History and Present Illness: ?Patient is calling in for 4 days of symptoms.  He got dizzy and collapsed and went to ER in [place] and had CT scan.  He is having a lot of diarrhea.  He had heart testing and he tested positive for covid on Saturday.  He is having coughing and congestion and diarrhea.  He feels like he has flu but no fevers but does feel achy.  They have been vaccinated for covid. He and his wife are both sick. He has a little SOB but no wheezing.  ? ?1. COVID-19 virus infection   ? ? ?Outpatient Encounter Medications as of 05/02/2021  ?Medication Sig  ? molnupiravir EUA (LAGEVRIO) 200 mg CAPS capsule Take 4 capsules (800 mg total) by mouth 2 (two) times daily for 5 days.  ? aspirin EC 81 MG tablet Take 81 mg by mouth daily. Evening 1600  ? atorvastatin (LIPITOR) 80 MG tablet Take 1 tablet (80 mg total) by mouth daily at 6 PM.  ? carvedilol (COREG) 6.25 MG tablet Take 1 tablet (6.25 mg total) by mouth 2 (two) times daily with a meal.  ? clopidogrel (PLAVIX) 75 MG tablet Take 1 tablet (75 mg total) by mouth daily.  ? dapagliflozin propanediol (FARXIGA) 10 MG TABS tablet Take by mouth daily.  ? diclofenac Sodium (VOLTAREN) 1 % GEL Apply 4 g topically 4 (four) times daily as needed  (knee pain). (Patient not taking: Reported on 08/02/2020)  ? furosemide (LASIX) 40 MG tablet Take 1 tablet (40 mg total) by mouth daily as needed (For weight gain > 3 lbs overnight.).  ? guaiFENesin (ROBITUSSIN) 100 MG/5ML liquid Take 200 mg by mouth 3 (three) times daily as needed for cough. (Patient not taking: Reported on 08/02/2020)  ? insulin aspart protamine - aspart (NOVOLOG MIX 70/30 FLEXPEN) (70-30) 100 UNIT/ML FlexPen Inject 36 Units into the skin daily. Before dinner.  ? levothyroxine (SYNTHROID, LEVOTHROID) 88 MCG tablet Take 88 mcg by mouth daily before breakfast.  ? metFORMIN (GLUCOPHAGE) 850 MG tablet Take 850 mg by mouth 2 (two) times daily with a meal.  ? nitroGLYCERIN (NITROSTAT) 0.4 MG SL tablet Place under the tongue. (Patient not taking: Reported on 08/02/2020)  ? Omega-3 Fatty Acids (FISH OIL PO) Take 1,000 mg by mouth 2 (two) times daily.  ? pantoprazole (PROTONIX) 40 MG tablet Take 1 tablet (40 mg total) by mouth daily.  ? sacubitril-valsartan (ENTRESTO) 49-51 MG Take 1 tablet by mouth 2 (two) times daily.  ? [DISCONTINUED] enoxaparin (LOVENOX) 80 MG/0.8ML injection Inject 0.8 mLs (80 mg total) into the skin every 12 (twelve) hours.  ? ?No facility-administered encounter medications on file as of 05/02/2021.  ? ? ?Review of Systems  ?Constitutional:  Positive for chills. Negative for fever.  ?HENT:  Positive for congestion, postnasal drip, rhinorrhea, sinus pressure, sneezing and sore throat. Negative for ear discharge, ear pain and voice change.   ?Eyes:  Negative for pain, discharge, redness and visual disturbance.  ?Respiratory:  Positive for cough and shortness of breath. Negative for wheezing.   ?Cardiovascular:  Negative for chest pain and leg swelling.  ?Gastrointestinal:  Positive for diarrhea. Negative for abdominal pain, blood in stool, nausea and vomiting.  ?Musculoskeletal:  Negative for gait problem.  ?Skin:  Negative for rash.  ?All other systems reviewed and are  negative. ? ?Observations/Objective: ?Patient sounds comfortable and in no acute distress ? ?Assessment and Plan: ?Problem List Items Addressed This Visit   ?None ?Visit Diagnoses   ? ? COVID-19 virus infection    -  Primary  ? Relevant Medications  ? molnupiravir EUA (LAGEVRIO) 200 mg CAPS capsule  ? ?  ?  ?Treat with otc mucinex and molnupiravir and call back if worsens ?Follow up plan: ?Return if symptoms worsen or fail to improve. ? ? ?  ?I discussed the assessment and treatment plan with the patient. The patient was provided an opportunity to ask questions and all were answered. The patient agreed with the plan and demonstrated an understanding of the instructions. ?  ?The patient was advised to call back or seek an in-person evaluation if the symptoms worsen or if the condition fails to improve as anticipated. ? ?The above assessment and management plan was discussed with the patient. The patient verbalized understanding of and has agreed to the management plan. Patient is aware to call the clinic if symptoms persist or worsen. Patient is aware when to return to the clinic for a follow-up visit. Patient educated on when it is appropriate to go to the emergency department.  ? ? ?I provided 7 minutes of non-face-to-face time during this encounter. ? ? ? ?Nils Pyle, MD ?  ? ?

## 2021-05-03 ENCOUNTER — Ambulatory Visit: Payer: Medicare PPO | Admitting: Physician Assistant

## 2021-05-09 ENCOUNTER — Ambulatory Visit: Payer: Medicare HMO

## 2021-05-12 ENCOUNTER — Ambulatory Visit (INDEPENDENT_AMBULATORY_CARE_PROVIDER_SITE_OTHER): Payer: No Typology Code available for payment source | Admitting: Orthopaedic Surgery

## 2021-05-12 ENCOUNTER — Ambulatory Visit (INDEPENDENT_AMBULATORY_CARE_PROVIDER_SITE_OTHER): Payer: No Typology Code available for payment source

## 2021-05-12 ENCOUNTER — Encounter: Payer: Self-pay | Admitting: Orthopaedic Surgery

## 2021-05-12 VITALS — Ht 67.5 in | Wt 165.0 lb

## 2021-05-12 DIAGNOSIS — M25561 Pain in right knee: Secondary | ICD-10-CM

## 2021-05-12 DIAGNOSIS — M1711 Unilateral primary osteoarthritis, right knee: Secondary | ICD-10-CM | POA: Diagnosis not present

## 2021-05-12 DIAGNOSIS — G8929 Other chronic pain: Secondary | ICD-10-CM

## 2021-05-17 DIAGNOSIS — M1711 Unilateral primary osteoarthritis, right knee: Secondary | ICD-10-CM | POA: Insufficient documentation

## 2021-05-17 NOTE — Progress Notes (Addendum)
? ?Office Visit Note ?  ?Patient: Gregory OleaLarry B Cole           ?Date of Birth: 18-Jan-1950           ?MRN: [id number] ?Visit Date: 05/12/2021 ?             ?Requested by: Gwenlyn FudgeJoyce, Britney F, FNP ?[address] [place],  [place] [id number] ?PCP: Gwenlyn FudgeJoyce, Britney F, FNP ? ? ?Assessment & Plan: ?Visit Diagnoses:  ?1. Chronic pain of right knee   ?2. Unilateral primary osteoarthritis, right knee   ? ? ?Plan: We discussed total knee arthroplasty.  Spinal anesthesia, abductor block, Exparel and Marcaine, home health therapy times a day usually 2 weeks versus outpatient therapy depending on [facility] coverage.  Questions elicited and answered.  He need preoperative cardiology clearance with past history of a cardiomyopathy with decreased ejection fraction.  Patient had previous stents placed in July.  Cardiologist is Dr. Daphene Jaegerom Kelly. ? ?Follow-Up Instructions: No follow-ups on file.  ? ?Orders:  ?Orders Placed This Encounter  ?Procedures  ? XR Knee 1-2 Views Right  ? ?No orders of the defined types were placed in this encounter. ? ? ? ? Procedures: ?No procedures performed ? ? ?Clinical Data: ?No additional findings. ? ? ?Subjective: ?Chief Complaint  ?Patient presents with  ? Right Knee - Pain  ? ? ?HPI [age] year old male veteran here with end-stage right knee osteoarthritis.  [facility].  Patient has pain all the time knee swells.  He is used ibuprofen, Tylenol, topical anti-inflammatories.  Sometimes he feels like the knee will make him fall.  MRI scan showed tricompartmental degenerative changes.  Pain is progressed over the last 3 years.  Patient has coronary disease hypertension and type 2 diabetes.  Patient is on Plavix also uses Voltaren gel on his knee.  Last A1c 7.2. ? ?Review of Systems ?No current chest pain all other systems are noncontributory to HPI. ? ?Objective: ?Vital Signs: Ht 5' 7.5" (1.715 m)   Wt 165 lb (74.8 kg)   BMI 25.46 kg/m?  ? ?Physical  Exam ?Constitutional:   ?   Appearance: He is well-developed.  ?HENT:  ?   Head: Normocephalic and atraumatic.  ?   Right Ear: External ear normal.  ?   Left Ear: External ear normal.  ?Eyes:  ?   Pupils: Pupils are equal, round, and reactive to light.  ?Neck:  ?   Thyroid: No thyromegaly.  ?   Trachea: No tracheal deviation.  ?Cardiovascular:  ?   Rate and Rhythm: Normal rate.  ?Pulmonary:  ?   Effort: Pulmonary effort is normal.  ?   Breath sounds: No wheezing.  ?Abdominal:  ?   General: Bowel sounds are normal.  ?   Palpations: Abdomen is soft.  ?Musculoskeletal:  ?   Cervical back: Neck supple.  ?Skin: ?   General: Skin is warm and dry.  ?   Capillary Refill: Capillary refill takes less than 2 seconds.  ?Neurological:  ?   Mental Status: He is alert and oriented to person, place, and time.  ?Psychiatric:     ?   Behavior: Behavior normal.     ?   Thought Content: Thought content normal.     ?   Judgment: Judgment normal.  ? ? ?Ortho Exam patient has right knee crepitus.  He is amatory in the right knee limp palpable pedal pulses negative logroll the hips.  Negative straight  leg raising 90 degrees. ? ?Specialty Comments:  ?No specialty comments available. ? ?Imaging: ?No results found. ? ? ?PMFS History: ?Patient Active Problem List  ? Diagnosis Date Noted  ? Unilateral primary osteoarthritis, right knee 05/17/2021  ? Hyperlipidemia associated with type 2 diabetes mellitus (HCC) 07/21/2020  ? Ischemic cardiomyopathy -> in setting of inferior STEMI, LV gram EF of 35 to 40% inferior wall hypokinesis.  LVEDP 26. 07/21/2020  ? Presence of drug coated stent in right coronary artery -> second overlapping DES stent proximal to previous stent 07/21/2020  ? Acute ST elevation myocardial infarction (STEMI) of inferior wall involving right ventricle (HCC) 07/20/2020  ? Postablative hypothyroidism 03/18/2020  ? Arthritis 03/18/2020  ? GERD (gastroesophageal reflux disease)   ? Type 2 diabetes mellitus with complication,  without long-term current use of insulin (HCC) 01/21/2010  ? Essential hypertension 01/21/2010  ? History of non-ST elevation myocardial infarction (NSTEMI) 01/21/2010  ? Coronary artery disease involving native heart with unstable angina pectoris (HCC) 01/21/2010  ? Tobacco use 01/21/2010  ? ?Past Medical History:  ?Diagnosis Date  ? Coronary artery disease   ? Diabetes mellitus without complication (HCC)   ? GERD (gastroesophageal reflux disease)   ? Glaucoma   ? Hypercholesteremia   ? Hypertension   ? Lesion of lung   ? patient [facility]  ? Myocardial infarction [facility])   ? x3  ? Postablative hypothyroidism 03/18/2020  ? Sleep apnea   ? unable to tolerate CPAP  ?  ?Family History  ?Problem Relation Age of Onset  ? Stroke Mother   ? Heart disease Father   ?  ?Past Surgical History:  ?Procedure Laterality Date  ? BOWEL RESECTION    ? CARDIAC CATHETERIZATION    ? 2 stents in heart  ? CATARACT EXTRACTION W/PHACO Left 07/01/2013  ? Procedure: CATARACT EXTRACTION PHACO AND INTRAOCULAR LENS PLACEMENT (IOC);  Surgeon: Loraine Leriche T. Nile Riggs, MD;  Location: [facility];  Service: Ophthalmology;  Laterality: Left;  CDE 6.84  ? CORONARY STENT INTERVENTION N/A 04/07/2019  ? Procedure: CORONARY STENT INTERVENTION;  Surgeon: Lennette Bihari, MD;  Location: [facility] INVASIVE CV LAB; mRCA 80% -> STENT RESOLUTE ONYX 3.5X22 (3.6 mm)  ? CORONARY/GRAFT ACUTE MI REVASCULARIZATION N/A 07/20/2020  ? Procedure: Coronary/Graft Acute MI Revascularization - Coronary Stent Intervention;  Surgeon: Lyn Records, MD;  Location: [facility] INVASIVE CV LAB;  Service: Cardiovascular;; proximal RCA 100% occlusion (proximal to previous stent)-3.5 mm 18 mm Onyx Frontier DES (3.75 mm)-overlaps previous stent.  ? HAND SURGERY Left   ? LEFT HEART CATH AND CORONARY ANGIOGRAPHY N/A 04/07/2019  ? Procedure: LEFT HEART CATH AND CORONARY ANGIOGRAPHY;  Surgeon: Lennette Bihari, MD;  Location: [facility] INVASIVE CV LAB;  Service: Cardiovascular;  Laterality: N/A;  ? LEFT HEART  CATH AND CORONARY ANGIOGRAPHY N/A 07/20/2020  ? Procedure: LEFT HEART CATH AND CORONARY ANGIOGRAPHY;  Surgeon: Lyn Records, MD;;  Cultprit Lesion: 100% Thrombotic proximal RCA occlusion, mid RCA stent.  (DES PCI-3.5 mm x18 mm -3.75 mm, overlapping mid RCA stent). tandem 70% & 50% PDA lesions, 50% PL 1.  Collaterals from distal RCA-> LAD. Patent stent in the RI.  20% ostial & imd LAD -> mid LAD severe diffuse disease followed by CTO.  ? NASAL SEPTUM SURGERY    ? SHOULDER ARTHROSCOPY Right   ? TRANSTHORACIC ECHOCARDIOGRAM  07/21/2020  ? (Post inferior STEMI) moderately reduced LVEF 30 to 35%.  Apical aneurysm with inferior akinesis and inferolateral/inferoseptal/severe HK.  GR 1 DD.  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  Normal RV, unable to assess pressures.  Mild aortic sclerosis but no stenosis.  Elevated RAP/CVP of estimated 15 mmHg with dilated IVC.  ? ?Social History  ? ?Occupational History  ? Not on file  ?Tobacco Use  ? Smoking status: Former  ?  Packs/day: 2.00  ?  Years: 40.00  ?  Pack years: 80.00  ?  Types: Cigarettes  ? Smokeless tobacco: Never  ?Vaping Use  ? Vaping Use: Never used  ?Substance and Sexual Activity  ? Alcohol use: Yes  ?  Comment: occasional glass of wine  ? Drug use: No  ? Sexual activity: Not Currently  ? ? ? ? ? ? ?

## 2021-05-19 ENCOUNTER — Telehealth: Payer: Self-pay | Admitting: *Deleted

## 2021-05-19 NOTE — Telephone Encounter (Signed)
? ?  Pre-operative Risk Assessment  ?  ?Patient Name: Gregory Cole  ?DOB: 06-Nov-1950 ?MRN: [id number]  ? ?  ? ?Request for Surgical Clearance   ? ?Procedure:   RIGHT TOTAL KNEE ARTHROPLASTY ? ?Date of Surgery:  Clearance TBD                              ?   ?Surgeon:  DR. MARK YATES ?Surgeon's Group or Practice Name:  [facility] ?Phone number:  [phone] ?Fax number:  [phone] ATTN: SHERRIE ?  ?Type of Clearance Requested:   ?- Medical  ?- Pharmacy:  Hold Aspirin and Clopidogrel (Plavix)   ?  ?Type of Anesthesia:  Spinal AND BLOCK ?  ?Additional requests/questions:   ? ?Signed, ?Danielle Rankin   ?05/19/2021, 8:13 AM  ? ?

## 2021-05-19 NOTE — Telephone Encounter (Signed)
Pt agreeable to plan of care for in office appt for pre op clearance. Pt appt 05/24/21 with Fabian Sharp, PAC. Will froward notes to [facility] for appt. Will send FYI to requesting office pt has appt ?

## 2021-05-19 NOTE — Telephone Encounter (Signed)
Primary Cardiologist:Thomas Tresa Endo, MD ? ?Chart reviewed as part of pre-operative protocol coverage. Because of Daylan Juhnke Haynes's past medical history and time since last visit, he/she will require a follow-up visit in order to better assess preoperative cardiovascular risk. ? ?Pre-op covering staff: ?- Please schedule appointment and call patient to inform them. ?- Please contact requesting surgeon's office via preferred method (i.e, phone, fax) to inform them of need for appointment prior to surgery. ? ?If applicable, this message will also be routed to pharmacy pool and/or primary cardiologist for input on holding anticoagulant/antiplatelet agent as requested below so that this information is available at time of patient's appointment.  ? ?Ronney Asters, NP  ?05/19/2021, 10:52 AM  ? ?

## 2021-05-24 ENCOUNTER — Telehealth: Payer: Self-pay

## 2021-05-24 ENCOUNTER — Ambulatory Visit: Payer: No Typology Code available for payment source | Admitting: Physician Assistant

## 2021-05-24 NOTE — Telephone Encounter (Signed)
Spoke with pt about r/s his appointment today 05/24/21 @ 1:55 PM. Pt said he didn't want to r/s. I asked pt to please wait and I put him on hold. Pt hung up after waiting a few seconds. I called pt back three times with no answer.  ?

## 2021-05-24 NOTE — Progress Notes (Deleted)
Cardiology Office Note   Date:  05/24/2021   ID:  Gregory Cole, DOB 1950-07-25, MRN [id number]  PCP:  Gwenlyn Fudge, FNP Cardiologist:  Nicki Guadalajara, MD  Electrphysiologist: None Theodore Demark, PA-C   No chief complaint on file.   History of Present Illness: Gregory Cole is a 71 y.o. male with a history of PCI x 3 2010, NSTEMI 2021 s/p DES RCA, STEMI 07/2020 s/p DES RCA (overlapping prev stents), DM, HTN,   Gregory Cole presents for ***  COVID status: un-vaccinated, had/did not have COVID Past Medical History:  Diagnosis Date   Coronary artery disease    Diabetes mellitus without complication (HCC)    GERD (gastroesophageal reflux disease)    Glaucoma    Hypercholesteremia    Hypertension    Lesion of lung    [facility] - [facility]   Myocardial infarction (HCC)    x3   Postablative hypothyroidism 03/18/2020   Sleep apnea    unable to tolerate CPAP    Past Surgical History:  Procedure Laterality Date   BOWEL RESECTION     CARDIAC CATHETERIZATION     2 stents in heart   CATARACT EXTRACTION W/PHACO Left 07/01/2013   Procedure: CATARACT EXTRACTION PHACO AND INTRAOCULAR LENS PLACEMENT (IOC);  Surgeon: Loraine Leriche T. Nile Riggs, MD;  Location: [facility];  Service: Ophthalmology;  Laterality: Left;  CDE 6.84   CORONARY STENT INTERVENTION N/A 04/07/2019   Procedure: CORONARY STENT INTERVENTION;  Surgeon: Lennette Bihari, MD;  Location: [facility]; mRCA 80% -> STENT RESOLUTE ONYX 3.5X22 (3.6 mm)   CORONARY/GRAFT ACUTE MI REVASCULARIZATION N/A 07/20/2020   Procedure: Coronary/Graft Acute MI Revascularization - Coronary Stent Intervention;  Surgeon: Lyn Records, MD;  Location: [facility];  Service: Cardiovascular;; proximal RCA 100% occlusion (proximal to previous stent)-3.5 mm 18 mm Onyx Frontier DES (3.75 mm)-overlaps previous stent.   HAND SURGERY Left    LEFT HEART CATH AND CORONARY ANGIOGRAPHY N/A 04/07/2019   Procedure: LEFT HEART CATH AND  CORONARY ANGIOGRAPHY;  Surgeon: Lennette Bihari, MD;  Location: [facility];  Service: Cardiovascular;  Laterality: N/A;   LEFT HEART CATH AND CORONARY ANGIOGRAPHY N/A 07/20/2020   Procedure: LEFT HEART CATH AND CORONARY ANGIOGRAPHY;  Surgeon: Lyn Records, MD;;  Cultprit Lesion: 100% Thrombotic proximal RCA occlusion, mid RCA stent.  (DES PCI-3.5 mm x18 mm -3.75 mm, overlapping mid RCA stent). tandem 70% & 50% PDA lesions, 50% PL 1.  Collaterals from distal RCA-> LAD. Patent stent in the RI.  20% ostial & imd LAD -> mid LAD severe diffuse disease followed by CTO.   NASAL SEPTUM SURGERY     SHOULDER ARTHROSCOPY Right    TRANSTHORACIC ECHOCARDIOGRAM  07/21/2020   (Post inferior STEMI) moderately reduced LVEF 30 to 35%.  Apical aneurysm with inferior akinesis and inferolateral/inferoseptal/severe HK.  GR 1 DD.  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  Normal RV, unable to assess pressures.  Mild aortic sclerosis but no stenosis.  Elevated RAP/CVP of estimated 15 mmHg with dilated IVC.    Current Outpatient Medications  Medication Sig Dispense Refill   aspirin EC 81 MG tablet Take 81 mg by mouth daily. Evening 1600     atorvastatin (LIPITOR) 80 MG tablet Take 1 tablet (80 mg total) by mouth daily at 6 PM. 30 tablet 0   carvedilol (COREG) 6.25 MG tablet Take 1  tablet (6.25 mg total) by mouth 2 (two) times daily with a meal. 180 tablet 3   clopidogrel (PLAVIX) 75 MG tablet Take 1 tablet (75 mg total) by mouth daily. 30 tablet 1   dapagliflozin propanediol (FARXIGA) 10 MG TABS tablet Take by mouth daily.     diclofenac Sodium (VOLTAREN) 1 % GEL Apply 4 g topically 4 (four) times daily as needed (knee pain). (Patient not taking: Reported on 08/02/2020)     furosemide (LASIX) 40 MG tablet Take 1 tablet (40 mg total) by mouth daily as needed (For weight gain > 3 lbs overnight.). 30 tablet 1   guaiFENesin (ROBITUSSIN) 100 MG/5ML liquid  Take 200 mg by mouth 3 (three) times daily as needed for cough. (Patient not taking: Reported on 08/02/2020)     insulin aspart protamine - aspart (NOVOLOG MIX 70/30 FLEXPEN) (70-30) 100 UNIT/ML FlexPen Inject 36 Units into the skin daily. Before dinner.     levothyroxine (SYNTHROID, LEVOTHROID) 88 MCG tablet Take 88 mcg by mouth daily before breakfast.     metFORMIN (GLUCOPHAGE) 850 MG tablet Take 850 mg by mouth 2 (two) times daily with a meal.     nitroGLYCERIN (NITROSTAT) 0.4 MG SL tablet Place under the tongue. (Patient not taking: Reported on 08/02/2020)     Omega-3 Fatty Acids (FISH OIL PO) Take 1,000 mg by mouth 2 (two) times daily.     pantoprazole (PROTONIX) 40 MG tablet Take 1 tablet (40 mg total) by mouth daily. 30 tablet 11   sacubitril-valsartan (ENTRESTO) 49-51 MG Take 1 tablet by mouth 2 (two) times daily. 60 tablet 1   No current facility-administered medications for this visit.    Allergies:   Patient has no known allergies.    Social History:  The patient  reports that he has quit smoking. His smoking use included cigarettes. He has a 80.00 pack-year smoking history. He has never used smokeless tobacco. He reports current alcohol use. He reports that he does not use drugs.   Family History:  The patient's family history includes Heart disease in his father; Stroke in his mother.  He indicated that his mother is deceased. He indicated that his father is deceased. He indicated that his sister is alive. He indicated that his maternal grandmother is deceased. He indicated that his maternal grandfather is deceased. He indicated that his paternal grandmother is deceased. He indicated that his paternal grandfather is deceased. He indicated that his daughter is alive.    ROS:  Please see the history of present illness. All other systems are reviewed and negative.    PHYSICAL EXAM: VS:  There were no vitals taken for this visit. , BMI There is no height or weight on file to  calculate BMI. GEN: Well nourished, well developed, male in no acute distress HEENT: normal for age  Neck: no JVD, no carotid bruit, no masses Cardiac: RRR; no murmur, no rubs, or gallops Respiratory:  clear to auscultation bilaterally, normal work of breathing GI: soft, nontender, nondistended, + BS MS: no deformity or atrophy; no edema; distal pulses are 2+ in all 4 extremities  Skin: warm and dry, no rash Neuro:  Strength and sensation are intact Psych: euthymic mood, full affect   EKG:  EKG {ACTION; IS/IS GI:[id number] ordered today. The ekg ordered today demonstrates ***  CARDIAC STUDIES Cath: 07/20/20:  Cultprit Lesion: 100% Thrombotic proximal RCA occlusion.  There is a downstream stent in the mid vessel. (DES PCI-3.5 mm x18 mm -3.75 mm, overlapping mid RCA  stent).       Tandem 70% then 50% PDA lesions, 50% PL 1.  Collaterals from the distal RCA perfuse the diffusely diseased distal LAD.Patent stent in the RI.  20% ostial LAD, 20 stent mid LAD followed by extensive disease then CTO of the mid LAD with bridging, left to left and right left collaterals.  Normal LCx with 3 OM branches - minimal disease..  EF estimated 35 to 40%.  LVEDP 26 mmHg.   Diagnostic                                                              Intervention        _______________________________________    Echo: 07/21/20 1. Left ventricular ejection fraction, by estimation, is 30 to 35%. The  left ventricle has moderately decreased function. The left ventricle  demonstrates regional wall motion abnormalities the apical aneurysm,  inferior akinesis, and  inferolateral/inferoseptal severe hypokinesis. Left ventricular diastolic  parameters are consistent with Grade I diastolic dysfunction (impaired  relaxation). 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   2. Right ventricular systolic function is normal. The right ventricular  size is normal. Tricuspid regurgitation signal is inadequate for  assessing  PA pressure.   3. The mitral valve is normal in structure. No evidence of mitral valve  regurgitation. No evidence of mitral stenosis.   4. The aortic valve is tricuspid. Aortic valve regurgitation is not  visualized. Mild aortic valve sclerosis is present, with no evidence of  aortic valve stenosis.   5. The inferior vena cava is dilated in size with <50% respiratory  variability, suggesting right atrial pressure of 15 mmHg.   6. Suspect apical thrombus but not completely clear with Definity. Would  consider cardiac MRI to make firm diagnosis.  _____________   Cardiac MRI 07/22/20: Mildly decreased LV function, LVEF 43%.   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   Parametric mapping suggestive of myocardial infarction.   Apex does not appear viable. Inferior territory may be viable (less so for inferior mid).     Recent Labs: 07/21/2020: TSH 1.003 08/03/2020: ALT 16; BNP 217.9; BUN 19; Creatinine, Ser 1.32; Hemoglobin 12.1; Platelets 318; Potassium 5.0; Sodium 141  CBC    Component Value Date/Time   WBC 7.6 08/03/2020 0805   WBC 7.4 07/23/2020 0158   RBC 4.33 08/03/2020 0805   RBC 3.84 (L) 07/23/2020 0158   HGB 12.1 (L) 08/03/2020 0805   HCT 37.9 08/03/2020 0805   PLT 318 08/03/2020 0805   MCV 88 08/03/2020 0805   MCH 27.9 08/03/2020 0805   MCH 29.2 07/23/2020 0158   MCHC 31.9 08/03/2020 0805   MCHC 33.2 07/23/2020 0158   RDW 14.5 08/03/2020 0805   LYMPHSABS 1.6 03/18/2020 1002   MONOABS 0.6 11/11/2008 1347   EOSABS 0.4 03/18/2020 1002   BASOSABS 0.1 03/18/2020 1002      Latest Ref Rng & Units 08/03/2020    8:05 AM 07/29/2020    9:35 AM 07/23/2020    1:58 AM  CMP  Glucose 65 - 99 mg/dL 124   123   161    BUN 8 - 27 mg/dL 19   30   20     Creatinine 0.76 - 1.27 mg/dL 1.32  1.47   1.43    Sodium 134 - 144 mmol/L 141   140   137    Potassium 3.5 - 5.2 mmol/L 5.0   5.6   3.4    Chloride 96 - 106 mmol/L 104   108   108    CO2 20 - 29  mmol/L 24   25   24     Calcium 8.6 - 10.2 mg/dL 9.7   9.6   8.8    Total Protein 6.0 - 8.5 g/dL 7.3    6.4    Total Bilirubin 0.0 - 1.2 mg/dL 0.3    0.6    Alkaline Phos 44 - 121 IU/L 70    56    AST 0 - 40 IU/L 20    69    ALT 0 - 44 IU/L 16    24       Lipid Panel Lab Results  Component Value Date   CHOL 108 07/21/2020   HDL 30 (L) 07/21/2020   LDLCALC 62 07/21/2020   TRIG 81 07/21/2020   CHOLHDL 3.6 07/21/2020      Wt Readings from Last 3 Encounters:  05/12/21 165 lb (74.8 kg)  08/02/20 183 lb 6.4 oz (83.2 kg)  07/29/20 183 lb 6.4 oz (83.2 kg)     Other studies Reviewed: Additional studies/ records that were reviewed today include: Office notes, hospital records and testing.  ASSESSMENT AND PLAN:  1.  ***   Current medicines are reviewed at length with the patient today.  The patient {ACTIONS; HAS/DOES NOT HAVE:19233} concerns regarding medicines.  The following changes have been made:  {PLAN; NO CHANGE:13088:s}  Labs/ tests ordered today include:  No orders of the defined types were placed in this encounter.    Disposition:   FU with Shelva Majestic, MD  Signed, Rosaria Ferries, PA-C  05/24/2021 10:55 AM    [place] Phone: [phone]; Fax: [phone]

## 2021-05-31 ENCOUNTER — Ambulatory Visit: Payer: Self-pay

## 2021-06-28 ENCOUNTER — Telehealth: Payer: Self-pay

## 2021-06-28 NOTE — Telephone Encounter (Signed)
I called patient to follow up and see if he was still wanting knee surgery.  He stated he has been following up with cardiology at [facility] in [place] not [facility].  He did not particularly care for Dr. Ophelia Charter and would most likely want another surgeon to do his TKA.  Also, his wife has dementia and would not be a good time to have surgery as he would need help for them both after surgery.  He will hold off for now.

## 2021-07-26 ENCOUNTER — Ambulatory Visit (INDEPENDENT_AMBULATORY_CARE_PROVIDER_SITE_OTHER): Payer: Medicare HMO

## 2021-07-26 VITALS — Wt 163.0 lb

## 2021-07-26 DIAGNOSIS — Z0000 Encounter for general adult medical examination without abnormal findings: Secondary | ICD-10-CM | POA: Diagnosis not present

## 2021-07-26 NOTE — Patient Instructions (Signed)
Gregory Cole , Thank you for taking time to come for your Medicare Wellness Visit. I appreciate your ongoing commitment to your health goals. Please review the following plan we discussed and let me know if I can assist you in the future.   Screening recommendations/referrals: Colonoscopy: Done 03/20/2018 - Repeat in 10 years Recommended yearly ophthalmology/optometry visit for glaucoma screening and checkup Recommended yearly dental visit for hygiene and checkup  Vaccinations: Influenza vaccine: Done 10/20/2020 - Repeat in 10 years Pneumococcal vaccine: Done 12/01/2009 & 03/18/2020 Tdap vaccine: Done 04/20/2021 - Repeat in 10 years Shingles vaccine: Done 09/09/2020 & 12/06/2020 Covid-19: Done 03/06/2019, 3/222021, 08/28/2019, 04/09/2020, & 10/07/2020  Advanced directives: Advance directive discussed with you today. I have provided a copy for you to complete at home and have notarized. Once this is complete please bring a copy in to our office so we can scan it into your chart.   Conditions/risks identified: Aim for 30 minutes of exercise or brisk walking, 6-8 glasses of water, and 5 servings of fruits and vegetables each day.   Next appointment: Follow up in one year for your annual wellness visit.   Preventive Care 45 Years and Older, Male  Preventive care refers to lifestyle choices and visits with your health care provider that can promote health and wellness. What does preventive care include? A yearly physical exam. This is also called an annual well check. Dental exams once or twice a year. Routine eye exams. Ask your health care provider how often you should have your eyes checked. Personal lifestyle choices, including: Daily care of your teeth and gums. Regular physical activity. Eating a healthy diet. Avoiding tobacco and drug use. Limiting alcohol use. Practicing safe sex. Taking low doses of aspirin every day. Taking vitamin and mineral supplements as recommended by your health  care provider. What happens during an annual well check? The services and screenings done by your health care provider during your annual well check will depend on your age, overall health, lifestyle risk factors, and family history of disease. Counseling  Your health care provider may ask you questions about your: Alcohol use. Tobacco use. Drug use. Emotional well-being. Home and relationship well-being. Sexual activity. Eating habits. History of falls. Memory and ability to understand (cognition). Work and work Astronomer. Screening  You may have the following tests or measurements: Height, weight, and BMI. Blood pressure. Lipid and cholesterol levels. These may be checked every 5 years, or more frequently if you are over 33 years old. Skin check. Lung cancer screening. You may have this screening every year starting at age 26 if you have a 30-pack-year history of smoking and currently smoke or have quit within the past 15 years. Fecal occult blood test (FOBT) of the stool. You may have this test every year starting at age 55. Flexible sigmoidoscopy or colonoscopy. You may have a sigmoidoscopy every 5 years or a colonoscopy every 10 years starting at age 87. Prostate cancer screening. Recommendations will vary depending on your family history and other risks. Hepatitis C blood test. Hepatitis B blood test. Sexually transmitted disease (STD) testing. Diabetes screening. This is done by checking your blood sugar (glucose) after you have not eaten for a while (fasting). You may have this done every 1-3 years. Abdominal aortic aneurysm (AAA) screening. You may need this if you are a current or former smoker. Osteoporosis. You may be screened starting at age 30 if you are at high risk. Talk with your health care provider about your test  results, treatment options, and if necessary, the need for more tests. Vaccines  Your health care provider may recommend certain vaccines, such  as: Influenza vaccine. This is recommended every year. Tetanus, diphtheria, and acellular pertussis (Tdap, Td) vaccine. You may need a Td booster every 10 years. Zoster vaccine. You may need this after age 42. Pneumococcal 13-valent conjugate (PCV13) vaccine. One dose is recommended after age 2. Pneumococcal polysaccharide (PPSV23) vaccine. One dose is recommended after age 38. Talk to your health care provider about which screenings and vaccines you need and how often you need them. This information is not intended to replace advice given to you by your health care provider. Make sure you discuss any questions you have with your health care provider. Document Released: 01/22/2015 Document Revised: 09/15/2015 Document Reviewed: 10/27/2014 Elsevier Interactive Patient Education  2017 [place] Prevention in the Home Falls can cause injuries. They can happen to people of all ages. There are many things you can do to make your home safe and to help prevent falls. What can I do on the outside of my home? Regularly fix the edges of walkways and driveways and fix any cracks. Remove anything that might make you trip as you walk through a door, such as a raised step or threshold. Trim any bushes or trees on the path to your home. Use bright outdoor lighting. Clear any walking paths of anything that might make someone trip, such as rocks or tools. Regularly check to see if handrails are loose or broken. Make sure that both sides of any steps have handrails. Any raised decks and porches should have guardrails on the edges. Have any leaves, snow, or ice cleared regularly. Use sand or salt on walking paths during winter. Clean up any spills in your garage right away. This includes oil or grease spills. What can I do in the bathroom? Use night lights. Install grab bars by the toilet and in the tub and shower. Do not use towel bars as grab bars. Use non-skid mats or decals in the tub or  shower. If you need to sit down in the shower, use a plastic, non-slip stool. Keep the floor dry. Clean up any water that spills on the floor as soon as it happens. Remove soap buildup in the tub or shower regularly. Attach bath mats securely with double-sided non-slip rug tape. Do not have throw rugs and other things on the floor that can make you trip. What can I do in the bedroom? Use night lights. Make sure that you have a light by your bed that is easy to reach. Do not use any sheets or blankets that are too big for your bed. They should not hang down onto the floor. Have a firm chair that has side arms. You can use this for support while you get dressed. Do not have throw rugs and other things on the floor that can make you trip. What can I do in the kitchen? Clean up any spills right away. Avoid walking on wet floors. Keep items that you use a lot in easy-to-reach places. If you need to reach something above you, use a strong step stool that has a grab bar. Keep electrical cords out of the way. Do not use floor polish or wax that makes floors slippery. If you must use wax, use non-skid floor wax. Do not have throw rugs and other things on the floor that can make you trip. What can I do with my stairs?  Do not leave any items on the stairs. Make sure that there are handrails on both sides of the stairs and use them. Fix handrails that are broken or loose. Make sure that handrails are as long as the stairways. Check any carpeting to make sure that it is firmly attached to the stairs. Fix any carpet that is loose or worn. Avoid having throw rugs at the top or bottom of the stairs. If you do have throw rugs, attach them to the floor with carpet tape. Make sure that you have a light switch at the top of the stairs and the bottom of the stairs. If you do not have them, ask someone to add them for you. What else can I do to help prevent falls? Wear shoes that: Do not have high heels. Have  rubber bottoms. Are comfortable and fit you well. Are closed at the toe. Do not wear sandals. If you use a stepladder: Make sure that it is fully opened. Do not climb a closed stepladder. Make sure that both sides of the stepladder are locked into place. Ask someone to hold it for you, if possible. Clearly mark and make sure that you can see: Any grab bars or handrails. First and last steps. Where the edge of each step is. Use tools that help you move around (mobility aids) if they are needed. These include: Canes. Walkers. Scooters. Crutches. Turn on the lights when you go into a dark area. Replace any light bulbs as soon as they burn out. Set up your furniture so you have a clear path. Avoid moving your furniture around. If any of your floors are uneven, fix them. If there are any pets around you, be aware of where they are. Review your medicines with your doctor. Some medicines can make you feel dizzy. This can increase your chance of falling. Ask your doctor what other things that you can do to help prevent falls. This information is not intended to replace advice given to you by your health care provider. Make sure you discuss any questions you have with your health care provider. Document Released: 10/22/2008 Document Revised: 06/03/2015 Document Reviewed: 01/30/2014 Elsevier Interactive Patient Education  2017 [employer].

## 2021-07-26 NOTE — Progress Notes (Signed)
Subjective:   Gregory Cole is a 71 y.o. male who presents for Medicare Annual/Subsequent preventive examination.  Virtual Visit via Telephone Note  I connected with  Gregory Cole on 07/26/21 at 11:15 AM EDT by telephone and verified that I am speaking with the correct person using two identifiers.  Location: Patient: Home Provider: [facility] Persons participating in the virtual visit: patient/Nurse Health Advisor   I discussed the limitations, risks, security and privacy concerns of performing an evaluation and management service by telephone and the availability of in person appointments. The patient expressed understanding and agreed to proceed.  Interactive audio and video telecommunications were attempted between this nurse and patient, however failed, due to patient having technical difficulties OR patient did not have access to video capability.  We continued and completed visit with audio only.  Some vital signs may be absent or patient reported.   Gregory Cole Gregory [name], LPN   Review of Systems     Cardiac Risk Factors include: advanced age (>38men, >32 women);diabetes mellitus;dyslipidemia;hypertension;male gender;sedentary lifestyle;Other (see comment);smoking/ tobacco exposure, Risk factor comments: CAD, hx of MI, cardiomyopathy     Objective:    Today's Vitals   07/26/21 1114  Weight: 163 lb (73.9 kg)   Body mass index is 25.15 kg/m.     07/26/2021   11:26 AM 07/21/2020    4:11 PM 07/21/2020   12:08 AM 05/28/2020    2:07 PM 04/05/2019   11:08 AM 06/26/2013   10:58 AM  Advanced Directives  Does Patient Have a Medical Advance Directive? No  No Yes No Patient does not have advance directive;Patient would not like information  Type of Insurance risk surveyor Power of [place];Living will    Copy of Healthcare Power of Attorney in Chart?    No - copy requested    Would patient like information on creating a medical advance directive? Yes (MAU/Ambulatory/Procedural  Areas - Information given) No - Patient declined   No - Guardian declined   Pre-existing out of facility DNR order (yellow form or pink MOST form)      No    Current Medications (verified) Outpatient Encounter Medications as of 07/26/2021  Medication Sig   aspirin EC 81 MG tablet Take 81 mg by mouth daily. Evening 1600   carvedilol (COREG) 12.5 MG tablet Take 12.5 mg by mouth 2 (two) times daily with a meal.   clopidogrel (PLAVIX) 75 MG tablet Take 1 tablet (75 mg total) by mouth daily.   Empagliflozin-Linaglip-Metform 12.5-2.05-998 MG TB24 Take by mouth.   levothyroxine (SYNTHROID, LEVOTHROID) 88 MCG tablet Take 88 mcg by mouth daily before breakfast.   Omega-3 Fatty Acids (FISH OIL PO) Take 1,000 mg by mouth 2 (two) times daily.   pantoprazole (PROTONIX) 40 MG tablet Take 1 tablet (40 mg total) by mouth daily.   sacubitril-valsartan (ENTRESTO) 49-51 MG Take 1 tablet by mouth 2 (two) times daily.   atorvastatin (LIPITOR) 80 MG tablet Take 1 tablet (80 mg total) by mouth daily at 6 PM.   furosemide (LASIX) 40 MG tablet Take 1 tablet (40 mg total) by mouth daily as needed (For weight gain > 3 lbs overnight.). (Patient not taking: Reported on 07/26/2021)   nitroGLYCERIN (NITROSTAT) 0.4 MG SL tablet Place under the tongue. (Patient not taking: Reported on 08/02/2020)   [DISCONTINUED] carvedilol (COREG) 6.25 MG tablet Take 1 tablet (6.25 mg total) by mouth 2 (two) times daily with a meal.   [DISCONTINUED] dapagliflozin propanediol (FARXIGA) 10 MG  TABS tablet Take by mouth daily.   [DISCONTINUED] diclofenac Sodium (VOLTAREN) 1 % GEL Apply 4 g topically 4 (four) times daily as needed (knee pain). (Patient not taking: Reported on 08/02/2020)   [DISCONTINUED] enoxaparin (LOVENOX) 80 MG/0.8ML injection Inject 0.8 mLs (80 mg total) into the skin every 12 (twelve) hours.   [DISCONTINUED] guaiFENesin (ROBITUSSIN) 100 MG/5ML liquid Take 200 mg by mouth 3 (three) times daily as needed for cough. (Patient not  taking: Reported on 08/02/2020)   [DISCONTINUED] insulin aspart protamine - aspart (NOVOLOG MIX 70/30 FLEXPEN) (70-30) 100 UNIT/ML FlexPen Inject 36 Units into the skin daily. Before dinner.   [DISCONTINUED] metFORMIN (GLUCOPHAGE) 850 MG tablet Take 850 mg by mouth 2 (two) times daily with a meal.   No facility-administered encounter medications on file as of 07/26/2021.    Allergies (verified) Patient has no known allergies.   History: Past Medical History:  Diagnosis Date   Coronary artery disease    Diabetes mellitus without complication (HCC)    GERD (gastroesophageal reflux disease)    Glaucoma    Hypercholesteremia    Hypertension    Lesion of lung    [facility]   Myocardial infarction (HCC)    x3   Postablative hypothyroidism 03/18/2020   Sleep apnea    unable to tolerate CPAP   Past Surgical History:  Procedure Laterality Date   BOWEL RESECTION     CARDIAC CATHETERIZATION     2 stents in heart   CATARACT EXTRACTION W/PHACO Left 07/01/2013   Procedure: CATARACT EXTRACTION PHACO AND INTRAOCULAR LENS PLACEMENT (IOC);  Surgeon: Loraine Leriche T. Nile Riggs, MD;  Location: [facility];  Service: Ophthalmology;  Laterality: Left;  CDE 6.84   CORONARY STENT INTERVENTION N/A 04/07/2019   Procedure: CORONARY STENT INTERVENTION;  Surgeon: Lennette Bihari, MD;  Location: [facility]; mRCA 80% -> STENT RESOLUTE ONYX 3.5X22 (3.6 mm)   CORONARY/GRAFT ACUTE MI REVASCULARIZATION N/A 07/20/2020   Procedure: Coronary/Graft Acute MI Revascularization - Coronary Stent Intervention;  Surgeon: Lyn Records, MD;  Location: [facility];  Service: Cardiovascular;; proximal RCA 100% occlusion (proximal to previous stent)-3.5 mm 18 mm Onyx Frontier DES (3.75 mm)-overlaps previous stent.   HAND SURGERY Left    LEFT HEART CATH AND CORONARY ANGIOGRAPHY N/A 04/07/2019   Procedure: LEFT HEART CATH AND CORONARY ANGIOGRAPHY;  Surgeon: Lennette Bihari, MD;  [facility];   Service: Cardiovascular;  Laterality: N/A;   LEFT HEART CATH AND CORONARY ANGIOGRAPHY N/A 07/20/2020   Procedure: LEFT HEART CATH AND CORONARY ANGIOGRAPHY;  Surgeon: Lyn Records, MD;;  Cultprit Lesion: 100% Thrombotic proximal RCA occlusion, mid RCA stent.  (DES PCI-3.5 mm x18 mm -3.75 mm, overlapping mid RCA stent). tandem 70% & 50% PDA lesions, 50% PL 1.  Collaterals from distal RCA-> LAD. Patent stent in the RI.  20% ostial & imd LAD -> mid LAD severe diffuse disease followed by CTO.   NASAL SEPTUM SURGERY     SHOULDER ARTHROSCOPY Right    TRANSTHORACIC ECHOCARDIOGRAM  07/21/2020   (Post inferior STEMI) moderately reduced LVEF 30 to 35%.  Apical aneurysm with inferior akinesis and inferolateral/inferoseptal/severe HK.  GR 1 DD.  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  Normal RV, unable to assess pressures.  Mild aortic sclerosis but no stenosis.  Elevated RAP/CVP of estimated 15 mmHg with dilated IVC.   Family History  Problem Relation Age  of Onset   Stroke Mother    Heart disease Father    Social History   Socioeconomic History   Marital status: Married    Spouse name: Gregory Cole   Number of children: 1   Years of education: Not on file   Highest education level: Not on file  Occupational History   Occupation: retired    Comment: [employer]  Tobacco Use   Smoking status: Former    Packs/day: 2.00    Years: 40.00    Total pack years: 80.00    Types: Cigarettes    Quit date: 01/10/2008    Years since quitting: 13.5   Smokeless tobacco: Never  Vaping Use   Vaping Use: Never used  Substance and Sexual Activity   Alcohol use: Yes    Comment: occasional glass of wine   Drug use: No   Sexual activity: Not Currently  Other Topics Concern   Not on file  Social History Narrative   Lives with his wife (who has dementia) - first floor of apartment complex   Social Determinants of Health   Financial Resource Strain: Low Risk   (07/26/2021)   Overall Financial Resource Strain (CARDIA)    Difficulty of Paying Living Expenses: Not hard at all  Food Insecurity: No Food Insecurity (07/26/2021)   Hunger Vital Sign    Worried About Running Out of Food in the Last Year: Never true    Ran Out of Food in the Last Year: Never true  Transportation Needs: No Transportation Needs (07/26/2021)   PRAPARE - Administrator, Civil Service (Medical): No    Lack of Transportation (Non-Medical): No  Physical Activity: Insufficiently Active (07/26/2021)   Exercise Vital Sign    Days of Exercise per Week: 7 days    Minutes of Exercise per Session: 20 min  Stress: No Stress Concern Present (07/26/2021)   [employer] of Occupational Health - Occupational Stress Questionnaire    Feeling of Stress : Only a little  Social Connections: Moderately Integrated (07/26/2021)   Social Connection and Isolation Panel [NHANES]    Frequency of Communication with Friends and Family: Once a week    Frequency of Social Gatherings with Friends and Family: Once a week    Attends Religious Services: More than 4 times per year    Active Member of [employer] or Organizations: Yes    Attends Engineer, structural: More than 4 times per year    Marital Status: Married    Tobacco Counseling Counseling given: Not Answered   Clinical Intake:  Pre-visit preparation completed: Yes  Pain : No/denies pain     BMI - recorded: 25.46 Nutritional Status: BMI 25 -29 Overweight Nutritional Risks: None Diabetes: Yes CBG done?: No Did pt. bring in CBG monitor from home?: No  How often do you need to have someone help you when you read instructions, pamphlets, or other written materials from your doctor or pharmacy?: 1 - Never  Diabetic? Nutrition Risk Assessment:  Has the patient had any N/V/D within the last 2 months?  No  Does the patient have any non-healing wounds?  No  Has the patient had any unintentional weight loss or weight  gain?  No   Diabetes:  Is the patient diabetic?  Yes  If diabetic, was a CBG obtained today?  No  Did the patient bring in their glucometer from home?  No  How often do you monitor your CBG's? never.   Financial Strains and Diabetes Management:  Are you having any financial strains with the device, your supplies or your medication? No .  Does the patient want to be seen by Chronic Care Management for management of their diabetes?  No  Would the patient like to be referred to a Nutritionist or for Diabetic Management?  No   Diabetic Exams:  Diabetic Eye Exam: Completed 2022 at [facility].  Diabetic Foot Exam: Completed 03/18/2020. Pt has been advised about the importance in completing this exam. Pt is scheduled for diabetic foot exam on next appt with PCP.    Interpreter Needed?: No  Information entered by :: Gohan Collister, LPN   Activities of Daily Living    07/26/2021   11:27 AM  In your present state of health, do you have any difficulty performing the following activities:  Hearing? 1  Vision? 0  Difficulty concentrating or making decisions? 0  Walking or climbing stairs? 0  Dressing or bathing? 0  Doing errands, shopping? 0  Preparing Food and eating ? N  Using the Toilet? N  In the past six months, have you accidently leaked urine? N  Do you have problems with loss of bowel control? N  Managing your Medications? N  Managing your Finances? N  Housekeeping or managing your Housekeeping? N    Patient Care Team: Gwenlyn Fudge, FNP as PCP - General (Family Medicine) Lennette Bihari, MD as PCP - Cardiology (Cardiology) Azalee Course, [place] as Physician Assistant (Cardiology)  Indicate any recent Medical Services you may have received from other than [facility] providers in the past year (date may be approximate).     Assessment:   This is a routine wellness examination for Gregory Cole.  Hearing/Vision screen Hearing Screening - Comments::  C/o moderate hearing loss - declines hearing aids  - has them, but they irritate his ears - [facility] Vision Screening - Comments:: Wears rx glasses - [facility]  Dietary issues and exercise activities discussed: Current Exercise Habits: Home exercise routine, Type of exercise: walking, Time (Minutes): 20, Frequency (Times/Week): 7, Weekly Exercise (Minutes/Week): 140, Intensity: Mild, Exercise limited by: cardiac condition(s);orthopedic condition(s)   Goals Addressed             This Visit's Progress    Patient Stated       Hopes to stay healthy and well enough to continue taking care of wife who has dementia       Depression Screen    07/26/2021   11:25 AM 05/28/2020    2:07 PM 03/18/2020   10:06 AM 08/21/2019   10:59 AM 11/20/2018   11:19 AM 04/21/2014    9:42 AM  PHQ 2/9 Scores  PHQ - 2 Score 2 0 0 0 0 0  PHQ- 9 Score 4   0      Fall Risk    07/26/2021   11:21 AM 05/28/2020    2:07 PM 03/18/2020   10:06 AM 08/21/2019   10:59 AM 11/20/2018   11:19 AM  Fall Risk   Falls in the past year? 1 1 0 0 1  Number falls in past yr: 1 0  0 0  Comment     Bruises  Injury with Fall? 0 0  0 0  Risk for fall due to : Orthopedic patient;Impaired balance/gait Orthopedic patient;Impaired vision;History of fall(s)     Follow up Falls prevention discussed;Education provided Education provided;Falls prevention discussed  Falls evaluation completed     FALL RISK  PREVENTION PERTAINING TO THE HOME:  Any stairs in or around the home? No  If so, are there any without handrails? No  Home free of loose throw rugs in walkways, pet beds, electrical cords, etc? Yes  Adequate lighting in your home to reduce risk of falls? Yes   ASSISTIVE DEVICES UTILIZED TO PREVENT FALLS:  Life alert? No  Use of a cane, walker or w/c?  Walking stick prn Grab bars in the bathroom? No  Shower chair or bench in shower? No  Elevated toilet seat or a handicapped toilet? No   TIMED UP AND GO:  Was the test  performed? No . Telephonic visit  Cognitive Function:        07/26/2021   11:28 AM 05/28/2020    1:56 PM  6CIT Screen  What Year? 0 points 0 points  What month? 0 points 0 points  What time? 0 points 0 points  Count back from 20 0 points 0 points  Months in reverse 0 points 0 points  Repeat phrase 2 points 0 points  Total Score 2 points 0 points    Immunizations Immunization History  Administered Date(s) Administered   Fluad Quad(high Dose 65+) 09/30/2018   Influenza-Unspecified 10/20/2020   Moderna Sars-Covid-2 Vaccination 03/06/2019, 03/31/2019, 08/28/2019, 04/09/2020, 10/07/2020   Pneumococcal Conjugate-13 03/18/2020   Pneumococcal Polysaccharide-23 12/01/2009   Tdap 04/20/2021   Zoster Recombinat (Shingrix) 09/09/2020, 12/06/2020    TDAP status: Up to date  Flu Vaccine status: Declined, Education has been provided regarding the importance of this vaccine but patient still declined. Advised may receive this vaccine at local pharmacy or Health Dept. Aware to provide a copy of the vaccination record if obtained from local pharmacy or Health Dept. Verbalized acceptance and understanding.  Pneumococcal vaccine status: Up to date  Covid-19 vaccine status: Completed vaccines  Qualifies for Shingles Vaccine? Yes   Zostavax completed Yes   Shingrix Completed?: Yes  Screening Tests Health Maintenance  Topic Date Due   Hepatitis C Screening  Never done   OPHTHALMOLOGY EXAM  05/09/2019   COVID-19 Vaccine (6 - Moderna series) 12/02/2020   HEMOGLOBIN A1C  01/22/2021   Pneumonia Vaccine 20+ Years old (3 - PPSV23 or PCV20) 03/18/2021   FOOT EXAM  03/18/2021   INFLUENZA VACCINE  08/09/2021   COLONOSCOPY (Pts 45-46yrs Insurance coverage will need to be confirmed)  03/19/2028   TETANUS/TDAP  04/21/2031   Zoster Vaccines- Shingrix  Completed   HPV VACCINES  Aged Out    Health Maintenance  Health Maintenance Due  Topic Date Due   Hepatitis C Screening  Never done    OPHTHALMOLOGY EXAM  05/09/2019   COVID-19 Vaccine (6 - Moderna series) 12/02/2020   HEMOGLOBIN A1C  01/22/2021   Pneumonia Vaccine 28+ Years old (3 - PPSV23 or PCV20) 03/18/2021   FOOT EXAM  03/18/2021    Colorectal cancer screening: Type of screening: Colonoscopy. Completed 03/20/2018. Repeat every 10 years  Lung Cancer Screening: (Low Dose CT Chest recommended if Age 41-80 years, 30 pack-year currently smoking OR have quit w/in 15years.) does qualify.   Lung Cancer Screening Referral: declines  Additional Screening:  Hepatitis C Screening: does qualify; done at [facility]  Vision Screening: Recommended annual ophthalmology exams for early detection of glaucoma and other disorders of the eye. Is the patient up to date with their annual eye exam?  Yes  Who is the provider or what is the name of the office in which the [facility]  If pt is not established with a provider, would they like to be referred to a provider to establish care? No .   Dental Screening: Recommended annual dental exams for proper oral hygiene  Community Resource Referral / Chronic Care Management: CRR required this visit?  No   CCM required this visit?  No      Plan:     I have personally reviewed and noted the following in the patient's chart:   Medical and social history Use of alcohol, tobacco or illicit drugs  Current medications and supplements including opioid prescriptions. Patient is not currently taking opioid prescriptions. Functional ability and status Nutritional status Physical activity Advanced directives List of other physicians Hospitalizations, surgeries, and ER visits in previous 12 months Vitals Screenings to include cognitive, depression, and falls Referrals and appointments  In addition, I have reviewed and discussed with patient certain preventive protocols, quality metrics, and best practice recommendations. A written personalized care plan for  preventive services as well as general preventive health recommendations were provided to patient.     [place] Constablemy Gregory Nycholas Rayner, LPN   9/14/78297/18/2023   Nurse Notes: NONE

## 2021-08-07 ENCOUNTER — Other Ambulatory Visit: Payer: Self-pay | Admitting: *Deleted

## 2021-08-07 NOTE — Patient Outreach (Signed)
  Care Management   Outreach Note  08/07/2021  Name: Gregory Cole MRN: [id number] DOB: 01/20/1950  Reason for Referral: Care Coordination - Assessment of Needs.   An unsuccessful telephone outreach was attempted today. The patient was referred to the case management team for assistance with care management and care coordination. A HIPAA compliant message was left on voicemail for patient, providing contact information for CSW, encouraging patient to return the call at his earliest convenience.   Follow Up Plan:  Scheduling Care Guide will reschedule initial telephone outreach call for patient with CSW.   Danford Bad, BSW, MSW, LCSW  Licensed Restaurant manager, fast food [facility]  Mailing [place] [address] [place], [place] [address] Physical Address[address], [place], [place] [address] Toll Free Main # [phone] Fax # [phone] Cell # [phone] [email or web address]

## 2021-08-15 ENCOUNTER — Other Ambulatory Visit: Payer: Self-pay | Admitting: *Deleted

## 2021-08-15 NOTE — Patient Outreach (Signed)
  Care Coordination   08/15/2021  Name: Gregory Cole MRN: [id number] DOB: [birth year]   Care Coordination Outreach Attempts:  A second unsuccessful outreach was attempted today to offer the patient with information about available care coordination services as a benefit of their health plan.   A HIPAA compliant message was left on voicemail, providing contact information for CSW, encouraging patient to return CSW's call at his earliest convenience.     Follow Up Plan:  Additional outreach attempts will be made to offer the patient care coordination information and services.    Encounter Outcome:  No Answer.    Care Coordination Interventions Activated:  No     Care Coordination Interventions:  No, not indicated.     Danford Bad, BSW, MSW, LCSW  Licensed Restaurant manager, fast food [facility] System  Mailing [place] [address] [place], [place] [address] Physical Address[address], [place], [place] [address] Toll Free Main # [phone] Fax # [phone] Cell # [phone] [email or web address]

## 2021-08-22 ENCOUNTER — Other Ambulatory Visit: Payer: Self-pay | Admitting: *Deleted

## 2021-08-22 NOTE — Patient Outreach (Signed)
  Care Coordination   08/22/2021  Name: Gregory Cole MRN: [id number] DOB: 06/20/1950   Care Coordination Outreach Attempts:  A third unsuccessful outreach was attempted today to offer the patient with information about available care coordination services as a benefit of their health plan. HIPAA compliant messages left on voicemail, providing contact information for CSW, encouraging patient to return CSW's call at his earliest convenience.  Follow Up Plan:  No further outreach attempts will be made at this time. We have been unable to contact the patient to offer or enroll patient in care coordination services  Encounter Outcome:  No Answer.   Care Coordination Interventions Activated:  No.    Care Coordination Interventions:  No, not indicated.    Danford Bad, BSW, MSW, LCSW  Licensed Restaurant manager, fast food [facility]  Mailing [place] [address] [place], [place] [address] Physical Address[address], [place], [place] [address] Toll Free Main # [phone] Fax # [phone] Cell # [phone] [email or web address]

## 2021-12-17 IMAGING — MR MR CARD MORPHOLOGY WO/W CM
45 of 48 series · 45 of 48 positions shown · IV contrast (Contrast agent)
Comparison: none

CLINICAL DATA: Clinical question of ventricular function
Study assumes HCT of 34

EXAM:
CARDIAC MRI
TECHNIQUE: The patient was scanned on a 1.5 Tesla GE magnet. A dedicated
cardiac coil was used. Functional imaging was done using Fiesta
sequences. [DATE], and 4 chamber views were done to assess for RWMA's.
Modified Moyer rule using a short axis stack was used to
calculate an ejection fraction on a dedicated work station using
Circle software. The patient received 10 cc of Gadavist. After 10
minutes inversion recovery sequences were used to assess for
infiltration and scar tissue.
CONTRAST:  10 cc  of Gadavist

[Series 4: [id number] · axial · 8.0mm · 1.41mm/px · 1 of 16 slices shown]
[im 1/16]
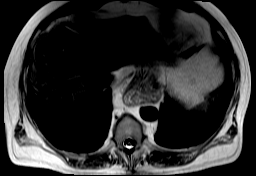

[Series 8: bSSFP · oblique · 8.0mm · 1.61mm/px · 1 of 25 slices shown (1 of 28)]
[im 1/25]
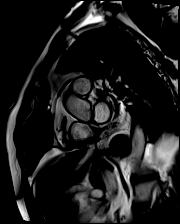

[Series 9: bSSFP · oblique · 8.0mm · 1.61mm/px · 1 of 25 slices shown (2 of 28)]
[im 1/25]
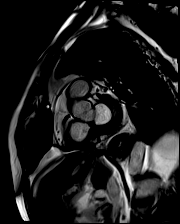

[Series 10: bSSFP · oblique · 8.0mm · 1.61mm/px · 1 of 25 slices shown (3 of 28)]
[im 1/25]
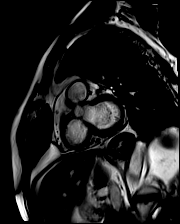

[Series 11: bSSFP · oblique · 8.0mm · 1.61mm/px · 1 of 25 slices shown (4 of 28)]
[im 1/25]
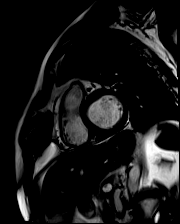

[Series 12: bSSFP · oblique · 8.0mm · 1.61mm/px · 1 of 25 slices shown (5 of 28)]
[im 1/25]
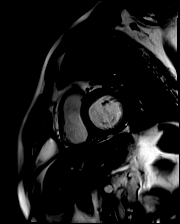

[Series 13: bSSFP · oblique · 8.0mm · 1.61mm/px · 1 of 25 slices shown (6 of 28)]
[im 1/25]
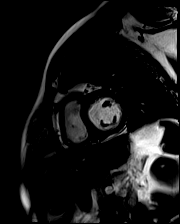

[Series 14: bSSFP · oblique · 8.0mm · 1.61mm/px · 1 of 25 slices shown (7 of 28)]
[im 1/25]
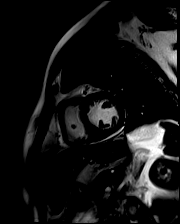

[Series 15: bSSFP · oblique · 8.0mm · 1.61mm/px · 1 of 25 slices shown (8 of 28)]
[im 1/25]
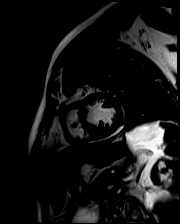

[Series 16: bSSFP · oblique · 8.0mm · 1.61mm/px · 1 of 25 slices shown (9 of 28)]
[im 1/25]
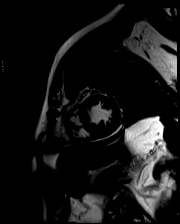

[Series 17: bSSFP · oblique · 8.0mm · 1.61mm/px · 1 of 25 slices shown (10 of 28)]
[im 1/25]
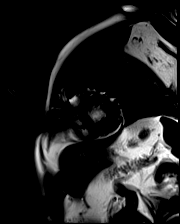

[Series 18: bSSFP · oblique · 8.0mm · 1.61mm/px · 1 of 25 slices shown (11 of 28)]
[im 1/25]
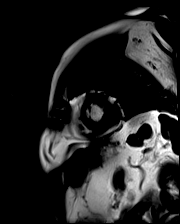

[Series 19: bSSFP · oblique · 8.0mm · 1.61mm/px · 1 of 25 slices shown (12 of 28)]
[im 1/25]
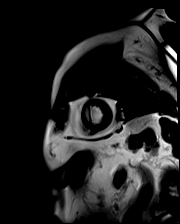

[Series 20: bSSFP · oblique · 8.0mm · 1.61mm/px · 1 of 25 slices shown (13 of 28)]
[im 1/25]
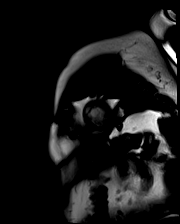

[Series 21: bSSFP · oblique · 8.0mm · 1.61mm/px · 1 of 25 slices shown (14 of 28)]
[im 1/25]
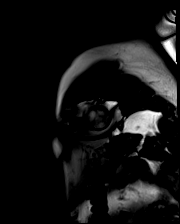

[Series 22: bSSFP · oblique · 8.0mm · 1.61mm/px · 1 of 25 slices shown (15 of 28)]
[im 1/25]
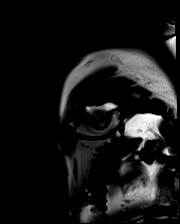

[Series 23: bSSFP · oblique · 8.0mm · 1.61mm/px · 1 of 25 slices shown (16 of 28)]
[im 1/25]
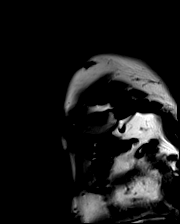

[Series 24: bSSFP · oblique · 8.0mm · 1.61mm/px · 1 of 25 slices shown (17 of 28)]
[im 1/25]
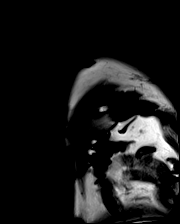

[Series 25: bSSFP · oblique · 6.0mm · 1.41mm/px · 1 of 25 slices shown (18 of 28)]
[im 1/25]
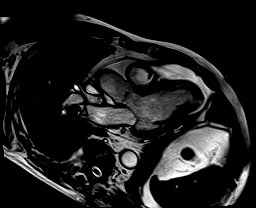

[Series 26: bSSFP · oblique · 6.0mm · 1.41mm/px · 1 of 25 slices shown (19 of 28)]
[im 1/25]
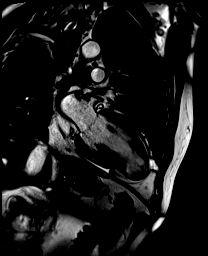

[Series 27: bSSFP · axial · 6.0mm · 1.41mm/px · 1 of 25 slices shown (20 of 28)]
[im 1/25]
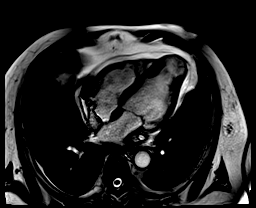

[Series 28: (id)_long_t1 · oblique · 8.0mm · 1.56mm/px · 1 of 24 slices shown]
[im 1/24]
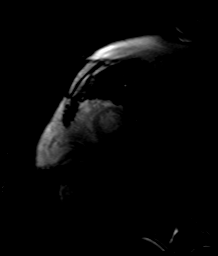

[Series 29: (id)_long_t1_moco · oblique · 8.0mm · 1.56mm/px · 1 of 24 slices shown]
[im 1/24]
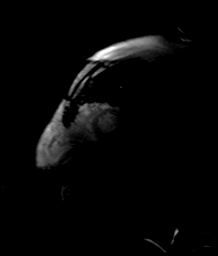

[Series 30: (id)_long_t1_moco_t1 · oblique · 8.0mm · 1.56mm/px · 1 of 6 slices shown]
[im 1/6]
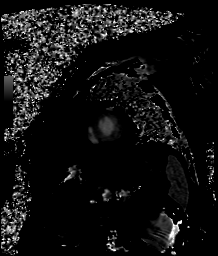

[Series 32: (id)_trufi · oblique · 8.0mm · 2.08mm/px · 1 of 9 slices shown]
[im 1/9]
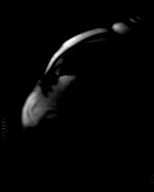

[Series 33: (id)_trufi_moco · oblique · 8.0mm · 2.08mm/px · 1 of 9 slices shown]
[im 1/9]
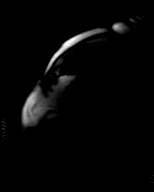

[Series 34: (id)_trufi_moco_t2 · oblique · 8.0mm · 2.08mm/px · 1 of 3 slices shown]
[im 1/3]
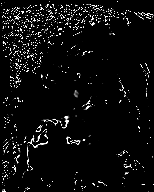

[Series 36: bSSFP · axial · 8.0mm · 1.61mm/px · 1 of 25 slices shown (21 of 28)]
[im 1/25]
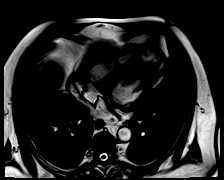

[Series 36: bSSFP · axial · 8.0mm · 1.61mm/px · 1 of 25 slices shown (22 of 28)]
[im 1/25]
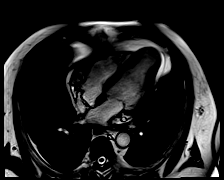

[Series 36: bSSFP · axial · 8.0mm · 1.61mm/px · 1 of 25 slices shown (23 of 28)]
[im 1/25]
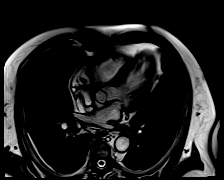

[Series 36: bSSFP · axial · 8.0mm · 1.61mm/px · 1 of 25 slices shown (24 of 28)]
[im 1/25]
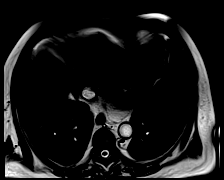

[Series 36: bSSFP · axial · 8.0mm · 1.61mm/px · 1 of 25 slices shown (25 of 28)]
[im 1/25]
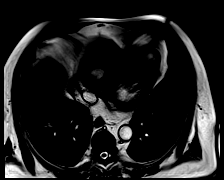

[Series 36: bSSFP · axial · 8.0mm · 1.61mm/px · 1 of 25 slices shown (26 of 28)]
[im 1/25]
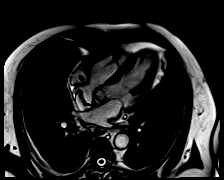

[Series 36: bSSFP · axial · 8.0mm · 1.61mm/px · 1 of 25 slices shown (27 of 28)]
[im 1/25]
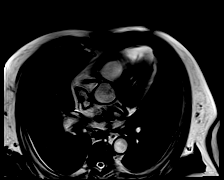

[Series 36: bSSFP · axial · 8.0mm · 1.61mm/px · 1 of 25 slices shown (28 of 28)]
[im 1/25]
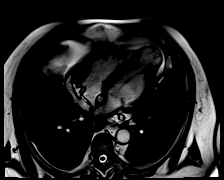

[Series 37: pre short axis · oblique · non-contrast · 8.0mm · 2.25mm/px · 1 of 10 slices shown (1 of 6)]
[im 1/10]
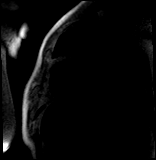

[Series 38: pre short axis · oblique · non-contrast · 8.0mm · 2.25mm/px · 1 of 10 slices shown (2 of 6)]
[im 1/10]
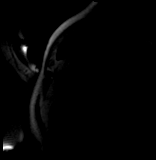

[Series 39: pre short axis · oblique · non-contrast · 8.0mm · 2.25mm/px · 1 of 10 slices shown (3 of 6)]
[im 1/10]
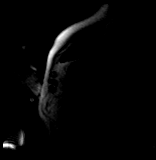

[Series 40: pre short axis · oblique · non-contrast · 8.0mm · 2.25mm/px · 1 of 10 slices shown (4 of 6)]
[im 1/10]
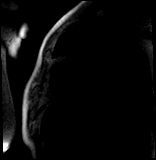

[Series 41: pre short axis · oblique · non-contrast · 8.0mm · 2.25mm/px · 1 of 10 slices shown (5 of 6)]
[im 1/10]
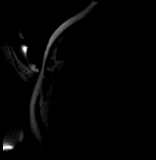

[Series 42: pre short axis · oblique · non-contrast · 8.0mm · 2.25mm/px · 1 of 10 slices shown (6 of 6)]
[im 1/10]
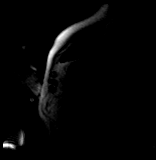

[Series 43: rest short axis · oblique · 8.0mm · 2.25mm/px · 1 of 60 slices shown (1 of 4)]
[im 1/60]
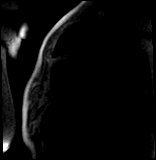

[Series 44: rest short axis · oblique · 8.0mm · 2.25mm/px · 1 of 60 slices shown (2 of 4)]
[im 1/60]
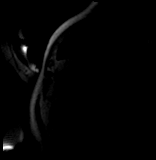

[Series 45: rest short axis · oblique · 8.0mm · 2.25mm/px · 1 of 60 slices shown (3 of 4)]
[im 1/60]
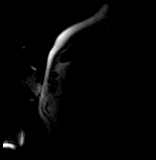

[Series 46: rest short axis · oblique · 8.0mm · 2.25mm/px · 1 of 60 slices shown (4 of 4)]
[im 1/60]
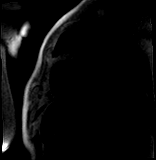

[45 of 48 positions shown; findings below may reference images not displayed]

FINDINGS: 1. Normal left ventricular size, with LVEDD 53 mm, and LVEDVi 83
mL/m2.

Normal left ventricular thickness, with intraventricular septal
thickness of 9 mm, posterior wall thickness of 7 mm, and myocardial
mass 63 g/m2.

Normal left ventricular systolic function (LVEF =43%). There is
basal, mid, and apical inferior septal and inferior hypokinesis;
there is a notable apical aneurysm. There is no thrombus.

Left ventricular parametric mapping notable for significant increase
in ECV signal in the apex (60% at highest), with moderate increase
in ECV in the mid and basal inferoseptal, inferior, inferolateral
(35-45%), and elevated T2 signal in the apex.

There is late gadolinium enhancement in the left ventricular
myocardium: Inferior, basal, endomyocardial (up to 50% myocardium),
Inferior, mid, endomyocardial (up to 75% myocardium), apical,
transmural in all segments.

2. Normal right ventricular size with RVEDVI 62 mL/m2.

Normal right ventricular thickness.

Normal right ventricular systolic function (RVEF =52%). There are no
regional wall motion abnormalities or aneurysms.

3.  Normal left and right atrial size.

4. Normal size of the aortic root, ascending aorta and pulmonary
artery for age and gender.

5.  No significant valvular abnormalities.

6.  Normal pericardium.  No pericardial effusion.

7. Grossly, no extracardiac findings. Recommended dedicated study if
concerned for non-cardiac pathology.
IMPRESSION: Mildly decreased LV function, LVEF 43%.

There is an apical aneurysm without evidence of thrombus.

Parametric mapping suggestive of myocardial infarction.

Apex does not appear viable. Inferior territory may be viable (less
so for inferior mid).

## 2022-04-19 ENCOUNTER — Ambulatory Visit (INDEPENDENT_AMBULATORY_CARE_PROVIDER_SITE_OTHER): Payer: Medicare HMO | Admitting: Family Medicine

## 2022-04-19 ENCOUNTER — Encounter: Payer: Self-pay | Admitting: Family Medicine

## 2022-04-19 VITALS — BP 111/67 | HR 60 | Temp 97.2°F | Ht 67.5 in | Wt 181.0 lb

## 2022-04-19 DIAGNOSIS — G8929 Other chronic pain: Secondary | ICD-10-CM

## 2022-04-19 DIAGNOSIS — M25512 Pain in left shoulder: Secondary | ICD-10-CM | POA: Diagnosis not present

## 2022-04-19 MED ORDER — METHYLPREDNISOLONE ACETATE 40 MG/ML IJ SUSP
40.0000 mg | Freq: Once | INTRAMUSCULAR | Status: AC
  Administered 2022-04-19: 60 mg via INTRAMUSCULAR

## 2022-04-19 NOTE — Progress Notes (Signed)
Subjective:  Patient ID: Gregory Cole, male    DOB: January 23, 1950, 72 y.o.   MRN: [id number]  Patient Care Team: Arrie Senate, FNP as PCP - General (Family Medicine) Lennette Bihari, MD as PCP - Cardiology (Cardiology) Azalee Course, [place] as Physician Assistant (Cardiology)   Chief Complaint:  Shoulder Pain (Left shoulder pain that has been going but has been getting worse over the last few months. )   HPI: Gregory Cole is a 72 y.o. male presenting on 04/19/2022 for Shoulder Pain (Left shoulder pain that has been going but has been getting worse over the last few months. )   Pt presents today for worsening left shoulder pain. He has OA and has had a shoulder injection several years ago which was very beneficial. No new injuries. Would like to get an injection today if able. [facility] and his last A1C was 7.8.   Shoulder Pain  The pain is present in the left shoulder. This is a chronic problem. The current episode started more than 1 year ago. There has been no history of extremity trauma. The problem has been gradually worsening. The quality of the pain is described as dull, pounding and aching. The pain is moderate. Associated symptoms include a limited range of motion and stiffness. Pertinent negatives include no fever. The symptoms are aggravated by activity. He has tried acetaminophen and rest for the symptoms. The treatment provided no relief. His past medical history is significant for osteoarthritis.   Relevant past medical, surgical, family, and social history reviewed and updated as indicated.  Allergies and medications reviewed and updated. Data reviewed: Chart in Epic.   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 Nile Riggs, MD;  Location: [facility];  Service: Ophthalmology;  Laterality: Left;  CDE 6.84   CORONARY STENT INTERVENTION N/A 04/07/2019   Procedure: CORONARY STENT INTERVENTION;  Surgeon: Lennette Bihari, MD;  Location: [facility]; mRCA 80% -> STENT RESOLUTE ONYX 3.5X22 (3.6 mm)   CORONARY/GRAFT ACUTE MI REVASCULARIZATION N/A 07/20/2020   Procedure: Coronary/Graft Acute MI Revascularization - Coronary Stent Intervention;  Surgeon: Lyn Records, MD;  Location: [facility];  Service: Cardiovascular;; proximal RCA 100% occlusion (proximal to previous stent)-3.5 mm 18 mm Onyx Frontier DES (3.75 mm)-overlaps previous stent.   HAND SURGERY Left    LEFT HEART CATH AND CORONARY ANGIOGRAPHY N/A 04/07/2019   Procedure: LEFT HEART CATH AND CORONARY ANGIOGRAPHY;  Surgeon: Lennette Bihari, MD;  Location: [facility];  Service: Cardiovascular;  Laterality: N/A;   LEFT HEART CATH AND CORONARY ANGIOGRAPHY N/A 07/20/2020   Procedure: LEFT HEART CATH AND CORONARY ANGIOGRAPHY;  Surgeon: Lyn Records, MD;;  Cultprit Lesion: 100% Thrombotic proximal RCA occlusion, mid  RCA stent.  (DES PCI-3.5 mm x18 mm -3.75 mm, overlapping mid RCA stent). tandem 70% & 50% PDA lesions, 50% PL 1.  Collaterals from distal RCA-> LAD. Patent stent in the RI.  20% ostial & imd LAD -> mid LAD severe diffuse disease followed by CTO.   NASAL SEPTUM SURGERY     SHOULDER ARTHROSCOPY Right    TRANSTHORACIC ECHOCARDIOGRAM  07/21/2020   (Post inferior STEMI) moderately reduced LVEF 30 to 35%.  Apical aneurysm with inferior akinesis and  inferolateral/inferoseptal/severe HK.  GR 1 DD.  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  Normal RV, unable to assess pressures.  Mild aortic sclerosis but no stenosis.  Elevated RAP/CVP of estimated 15 mmHg with dilated IVC.    Social History   Socioeconomic History   Marital status: Married    Spouse name: [name]   Number of children: 1   Years of education: Not on file   Highest education level: Not on file  Occupational History   Occupation: retired    Comment: [employer]  Tobacco Use   Smoking status: Former    Packs/day: 2.00    Years: 40.00    Additional pack years: 0.00    Total pack years: 80.00    Types: Cigarettes    Quit date: 01/10/2008    Years since quitting: 14.2   Smokeless tobacco: Never  Vaping Use   Vaping Use: Never used  Substance and Sexual Activity   Alcohol use: Yes    Comment: occasional glass of wine   Drug use: No   Sexual activity: Not Currently  Other Topics Concern   Not on file  Social History Narrative   Lives with his wife (who has dementia) - first floor of apartment complex   Social Determinants of Health   Financial Resource Strain: Low Risk  (07/26/2021)   Overall Financial Resource Strain (CARDIA)    Difficulty of Paying Living Expenses: Not hard at all  Food Insecurity: No Food Insecurity (07/26/2021)   Hunger Vital Sign    Worried About Running Out of Food in the Last Year: Never true    Ran Out of Food in the Last Year: Never true  Transportation Needs: No Transportation Needs (07/26/2021)   PRAPARE - Administrator, Civil Service (Medical): No    Lack of Transportation (Non-Medical): No  Physical Activity: Insufficiently Active (07/26/2021)   Exercise Vital Sign    Days of Exercise per Week: 7 days    Minutes of Exercise per Session: 20 min  Stress: No Stress Concern Present (07/26/2021)   [employer] of Occupational Health - Occupational Stress  Questionnaire    Feeling of Stress : Only a little  Social Connections: Moderately Integrated (07/26/2021)   Social Connection and Isolation Panel [NHANES]    Frequency of Communication with Friends and Family: Once a week    Frequency of Social Gatherings with Friends and Family: Once a week    Attends Religious Services: More than 4 times per year    Active Member of [employer] or Organizations: Yes    Attends Banker Meetings: More than 4 times per year    Marital Status: Married  Catering manager Violence: Not At Risk (07/26/2021)   Humiliation, Afraid, Rape, and Kick questionnaire    Fear of Current or Ex-Partner: No    Emotionally Abused: No    Physically Abused: No    Sexually Abused: No  Outpatient Encounter Medications as of 04/19/2022  Medication Sig   aspirin EC 81 MG tablet Take 81 mg by mouth daily. Evening 1600   carvedilol (COREG) 12.5 MG tablet Take 12.5 mg by mouth 2 (two) times daily with a meal.   clopidogrel (PLAVIX) 75 MG tablet Take 1 tablet (75 mg total) by mouth daily.   Empagliflozin-Linaglip-Metform 12.5-2.05-998 MG TB24 Take by mouth.   levothyroxine (SYNTHROID, LEVOTHROID) 88 MCG tablet Take 88 mcg by mouth daily before breakfast.   nitroGLYCERIN (NITROSTAT) 0.4 MG SL tablet Place under the tongue.   Omega-3 Fatty Acids (FISH OIL PO) Take 1,000 mg by mouth 2 (two) times daily.   pantoprazole (PROTONIX) 40 MG tablet Take 1 tablet (40 mg total) by mouth daily.   sacubitril-valsartan (ENTRESTO) 49-51 MG Take 1 tablet by mouth 2 (two) times daily.   atorvastatin (LIPITOR) 80 MG tablet Take 1 tablet (80 mg total) by mouth daily at 6 PM.   furosemide (LASIX) 40 MG tablet Take 1 tablet (40 mg total) by mouth daily as needed (For weight gain > 3 lbs overnight.). (Patient not taking: Reported on 07/26/2021)   [DISCONTINUED] enoxaparin (LOVENOX) 80 MG/0.8ML injection Inject 0.8 mLs (80 mg total) into the skin every 12 (twelve) hours.   [EXPIRED]  methylPREDNISolone acetate (DEPO-MEDROL) injection 40 mg    No facility-administered encounter medications on file as of 04/19/2022.    No Known Allergies  Review of Systems  Constitutional:  Negative for activity change, appetite change, chills, diaphoresis, fatigue, fever and unexpected weight change.  HENT: Negative.    Eyes: Negative.   Respiratory:  Negative for cough, chest tightness and shortness of breath.   Cardiovascular:  Negative for chest pain, palpitations and leg swelling.  Gastrointestinal:  Negative for blood in stool, constipation, diarrhea, nausea and vomiting.  Endocrine: Negative.   Genitourinary:  Negative for dysuria, frequency and urgency.  Musculoskeletal:  Positive for arthralgias and stiffness. Negative for myalgias.  Skin: Negative.   Allergic/Immunologic: Negative.   Neurological:  Negative for dizziness and headaches.  Hematological: Negative.  Does not bruise/bleed easily.  Psychiatric/Behavioral:  Negative for confusion, hallucinations, sleep disturbance and suicidal ideas.   All other systems reviewed and are negative.       Objective:  BP 111/67   Pulse 60   Temp (!) 97.2 F (36.2 C) (Temporal)   Ht 5' 7.5" (1.715 m)   Wt 181 lb (82.1 kg)   SpO2 96%   BMI 27.93 kg/m    Wt Readings from Last 3 Encounters:  04/19/22 181 lb (82.1 kg)  07/26/21 163 lb (73.9 kg)  05/12/21 165 lb (74.8 kg)    Physical Exam Vitals and nursing note reviewed.  Constitutional:      General: He is not in acute distress.    Appearance: Normal appearance. He is well-developed and well-groomed. He is not ill-appearing, toxic-appearing or diaphoretic.  HENT:     Head: Normocephalic and atraumatic.     Jaw: There is normal jaw occlusion.     Right Ear: Hearing normal.     Left Ear: Hearing normal.     Nose: Nose normal.     Mouth/Throat:     Lips: Pink.     Mouth: Mucous membranes are moist.     Pharynx: Uvula midline.  Eyes:     General: Lids are normal.      Conjunctiva/sclera: Conjunctivae normal.     Pupils: Pupils are equal, round, and reactive to light.  Neck:  Thyroid: No thyroid mass, thyromegaly or thyroid tenderness.     Vascular: No carotid bruit or JVD.     Trachea: Trachea and phonation normal.  Cardiovascular:     Rate and Rhythm: Normal rate and regular rhythm.     Chest Wall: PMI is not displaced.     Pulses: Normal pulses.     Heart sounds: Normal heart sounds. No murmur heard.    No friction rub. No gallop.  Pulmonary:     Effort: Pulmonary effort is normal. No respiratory distress.     Breath sounds: Normal breath sounds. No wheezing.  Abdominal:     General: There is no abdominal bruit.     Palpations: There is no hepatomegaly or splenomegaly.  Musculoskeletal:     Left shoulder: Tenderness present. No swelling, deformity, effusion, laceration, bony tenderness or crepitus. Decreased range of motion. Decreased strength. Normal pulse.     Left upper arm: Normal.     Cervical back: Normal, normal range of motion and neck supple.     Right lower leg: No edema.     Left lower leg: No edema.     Comments: Left shoulder: positive neers and hawkins sign, negative drop arm sign  Lymphadenopathy:     Cervical: No cervical adenopathy.  Skin:    General: Skin is warm and dry.     Capillary Refill: Capillary refill takes less than 2 seconds.     Coloration: Skin is not cyanotic, jaundiced or pale.     Findings: No rash.  Neurological:     General: No focal deficit present.     Mental Status: He is alert and oriented to person, place, and time.     Sensory: Sensation is intact.     Motor: Motor function is intact.     Coordination: Coordination is intact.     Gait: Gait is intact.     Deep Tendon Reflexes: Reflexes are normal and symmetric.  Psychiatric:        Attention and Perception: Attention and perception normal.        Mood and Affect: Mood and affect normal.        Speech: Speech normal.        Behavior:  Behavior normal. Behavior is cooperative.        Thought Content: Thought content normal.        Cognition and Memory: Cognition and memory normal.        Judgment: Judgment normal.     Results for orders placed or performed in visit on 08/02/20  Comprehensive metabolic panel  Result Value Ref Range   Glucose 124 (H) 65 - 99 mg/dL   BUN 19 8 - 27 mg/dL   Creatinine, Ser 1.611.32 (H) 0.76 - 1.27 mg/dL   eGFR 58 (L) >09>59 UE/AVW/0.98mL/min/1.73   BUN/Creatinine Ratio 14 10 - 24   Sodium 141 134 - 144 mmol/L   Potassium 5.0 3.5 - 5.2 mmol/L   Chloride 104 96 - 106 mmol/L   CO2 24 20 - 29 mmol/L   Calcium 9.7 8.6 - 10.2 mg/dL   Total Protein 7.3 6.0 - 8.5 g/dL   Albumin 4.4 3.7 - 4.7 g/dL   Globulin, Total 2.9 1.5 - 4.5 g/dL   Albumin/Globulin Ratio 1.5 1.2 - 2.2   Bilirubin Total 0.3 0.0 - 1.2 mg/dL   Alkaline Phosphatase 70 44 - 121 IU/L   AST 20 0 - 40 IU/L   ALT 16 0 - 44 IU/L  Brain natriuretic  peptide  Result Value Ref Range   BNP 217.9 (H) 0.0 - 100.0 pg/mL  CBC  Result Value Ref Range   WBC 7.6 3.4 - 10.8 x10E3/uL   RBC 4.33 4.14 - 5.80 x10E6/uL   Hemoglobin 12.1 (L) 13.0 - 17.7 g/dL   Hematocrit 25.6 38.9 - 51.0 %   MCV 88 79 - 97 fL   MCH 27.9 26.6 - 33.0 pg   MCHC 31.9 31.5 - 35.7 g/dL   RDW 37.3 42.8 - 76.8 %   Platelets 318 150 - 450 x10E3/uL     Joint Injection/Arthrocentesis  Date/Time: 04/19/2022 8:50 AM  Performed by: Sonny Masters, FNP Authorized by: Sonny Masters, FNP  Indications: pain and joint swelling  Body area: shoulder Joint: left shoulder Local anesthesia used: yes  Anesthesia: Local anesthesia used: yes Local Anesthetic: co-phenylcaine spray  Sedation: Patient sedated: no  Preparation: Patient was prepped and draped in the usual sterile fashion. Needle size: 22 G Ultrasound guidance: no Approach: posterior Aspirate amount: 0 mL Methylprednisolone amount: 60 mg Lidocaine 2% amount: 3.5 mL Patient tolerance: patient tolerated the procedure well  with no immediate complications      Pertinent labs & imaging results that were available during my care of the patient were reviewed by me and considered in my medical decision making.  Assessment & Plan:  Antero was seen today for shoulder pain.  Diagnoses and all orders for this visit:  Chronic left shoulder pain Tolerated well. No immediate complications of signs of bleeding. Symptomatic care discussed in detail. Report new, worsening, or persistent symptoms.  -     methylPREDNISolone acetate (DEPO-MEDROL) injection 40 mg -     Joint Injection/Arthrocentesis     Continue all other maintenance medications.  Follow up plan: Return if symptoms worsen or fail to improve.   Continue healthy lifestyle choices, including diet (rich in fruits, vegetables, and lean proteins, and low in salt and simple carbohydrates) and exercise (at least 30 minutes of moderate physical activity daily).  Educational handout given for joint injection   The above assessment and management plan was discussed with the patient. The patient verbalized understanding of and has agreed to the management plan. Patient is aware to call the clinic if they develop any new symptoms or if symptoms persist or worsen. Patient is aware when to return to the clinic for a follow-up visit. Patient educated on when it is appropriate to go to the emergency department.   Kari Baars, FNP-C [facility] [phone]

## 2022-04-28 ENCOUNTER — Telehealth: Payer: Medicare HMO

## 2022-05-04 ENCOUNTER — Encounter: Payer: Self-pay | Admitting: Family Medicine

## 2022-05-04 ENCOUNTER — Ambulatory Visit (INDEPENDENT_AMBULATORY_CARE_PROVIDER_SITE_OTHER): Payer: Medicare HMO | Admitting: Family Medicine

## 2022-05-04 VITALS — BP 112/69 | HR 62 | Temp 98.7°F | Ht 67.5 in | Wt 178.0 lb

## 2022-05-04 DIAGNOSIS — R6889 Other general symptoms and signs: Secondary | ICD-10-CM | POA: Diagnosis not present

## 2022-05-04 DIAGNOSIS — E785 Hyperlipidemia, unspecified: Secondary | ICD-10-CM

## 2022-05-04 DIAGNOSIS — Z7984 Long term (current) use of oral hypoglycemic drugs: Secondary | ICD-10-CM | POA: Diagnosis not present

## 2022-05-04 DIAGNOSIS — E079 Disorder of thyroid, unspecified: Secondary | ICD-10-CM | POA: Diagnosis not present

## 2022-05-04 DIAGNOSIS — R944 Abnormal results of kidney function studies: Secondary | ICD-10-CM | POA: Diagnosis not present

## 2022-05-04 DIAGNOSIS — L84 Corns and callosities: Secondary | ICD-10-CM | POA: Diagnosis not present

## 2022-05-04 DIAGNOSIS — E1169 Type 2 diabetes mellitus with other specified complication: Secondary | ICD-10-CM | POA: Diagnosis not present

## 2022-05-04 DIAGNOSIS — R739 Hyperglycemia, unspecified: Secondary | ICD-10-CM | POA: Diagnosis not present

## 2022-05-04 DIAGNOSIS — E118 Type 2 diabetes mellitus with unspecified complications: Secondary | ICD-10-CM

## 2022-05-04 LAB — BAYER DCA HB A1C WAIVED: HB A1C (BAYER DCA - WAIVED): 7.6 % — ABNORMAL HIGH (ref 4.8–5.6)

## 2022-05-04 NOTE — Progress Notes (Signed)
New Patient Office Visit  Subjective   Patient ID: Gregory Cole, male    DOB: July 23, 1950  Age: 72 y.o. MRN: [id number]  Chief Complaint  Patient presents with   Diabetes   HPI BRONTE KROPF presents to follow up on Diabetes, renal function, Thyroid, and HLD.   Type 2 Diabetes with CAD, HLD Glucometer: ACCUCHECK, has not used it in several years   Taking medication(s): empagliflozin, metformin,. Denies symptoms of hypoglycemia. States that he has not had any since starting Metformin.  Last eye exam: Due, connected through the [place] Last foot exam: due today  Last A1c:  Lab Results  Component Value Date   HGBA1C 7.6 (H) 05/04/2022   Nephropathy screen indicated?: yes  Last flu, zoster and/or pneumovax:  Immunization History  Administered Date(s) Administered   Fluad Quad(high Dose 65+) 09/30/2018   Influenza-Unspecified 10/20/2020   Moderna Sars-Covid-2 Vaccination 03/06/2019, 03/31/2019, 08/28/2019, 04/09/2020, 10/07/2020   Pneumococcal Conjugate-13 03/18/2020   Pneumococcal Polysaccharide-23 12/01/2009   Tdap 04/20/2021   Zoster Recombinat (Shingrix) 09/09/2020, 12/06/2020    ROS: Denies dizziness, LOC, polyuria, polydipsia, unintended weight loss/gain, foot ulcerations, numbness or tingling in extremities, shortness of breath or chest pain.  Reports that because he is hard of hearing his equilibrium is thrown off, but that it is a different kind of dizzy.   Thyroid  Denies constipation, palpitations, cold/heat intolerance Managed by the [place] . Has not had level checked in years. Takes synthroid daily.   HLD. Managed by [facility]  Outpatient Encounter Medications as of 05/04/2022  Medication Sig   Alogliptin Benzoate 25 MG TABS Take 12.5 mg by mouth daily.   aspirin EC 81 MG tablet Take 81 mg by mouth daily. Evening 1600   carvedilol (COREG) 12.5 MG tablet Take 12.5 mg by mouth 2 (two) times daily with a meal.   clopidogrel (PLAVIX) 75 MG tablet Take 1 tablet (75 mg  total) by mouth daily.   Empagliflozin-Linaglip-Metform 12.5-2.05-998 MG TB24 Take by mouth.   levothyroxine (SYNTHROID, LEVOTHROID) 88 MCG tablet Take 88 mcg by mouth daily before breakfast.   nitroGLYCERIN (NITROSTAT) 0.4 MG SL tablet Place under the tongue.   Omega-3 Fatty Acids (FISH OIL PO) Take 1,000 mg by mouth 2 (two) times daily.   pantoprazole (PROTONIX) 40 MG tablet Take 1 tablet (40 mg total) by mouth daily.   sacubitril-valsartan (ENTRESTO) 49-51 MG Take 1 tablet by mouth 2 (two) times daily.   atorvastatin (LIPITOR) 80 MG tablet Take 1 tablet (80 mg total) by mouth daily at 6 PM.   furosemide (LASIX) 40 MG tablet Take 1 tablet (40 mg total) by mouth daily as needed (For weight gain > 3 lbs overnight.). (Patient not taking: Reported on 07/26/2021)   [DISCONTINUED] enoxaparin (LOVENOX) 80 MG/0.8ML injection Inject 0.8 mLs (80 mg total) into the skin every 12 (twelve) hours.   No facility-administered encounter medications on file as of 05/04/2022.    Past Medical History:  Diagnosis Date   Coronary artery disease    Diabetes mellitus without complication    GERD (gastroesophageal reflux disease)    Glaucoma    Hypercholesteremia    Hypertension    Lesion of lung    [facility]   Myocardial infarction    x3   Postablative hypothyroidism 03/18/2020   Sleep apnea    unable to tolerate CPAP    Past Surgical History:  Procedure Laterality Date   BOWEL RESECTION     CARDIAC CATHETERIZATION  2 stents in heart   CATARACT EXTRACTION W/PHACO Left 07/01/2013   Procedure: CATARACT EXTRACTION PHACO AND INTRAOCULAR LENS PLACEMENT (IOC);  Surgeon: Loraine Leriche T. Nile Riggs, MD;  Location: [facility];  Service: Ophthalmology;  Laterality: Left;  CDE 6.84   CORONARY STENT INTERVENTION N/A 04/07/2019   Procedure: CORONARY STENT INTERVENTION;  Surgeon: Lennette Bihari, MD;  Location: [facility] IN[facility]ASIVE CV LAB; mRCA 80% -> STENT RESOLUTE ONYX 3.5X22 (3.6 mm)   CORONARY/GRAFT ACUTE MI  REVASCULARIZATION N/A 07/20/2020   Procedure: Coronary/Graft Acute MI Revascularization - Coronary Stent Intervention;  Surgeon: Lyn Records, MD;  Location: [facility] INVASIVE CV LAB;  Service: Cardiovascular;; proximal RCA 100% occlusion (proximal to previous stent)-3.5 mm 18 mm Onyx Frontier DES (3.75 mm)-overlaps previous stent.   HAND SURGERY Left    LEFT HEART CATH AND CORONARY ANGIOGRAPHY N/A 04/07/2019   Procedure: LEFT HEART CATH AND CORONARY ANGIOGRAPHY;  Surgeon: Lennette Bihari, MD;  Location: [facility];  Service: Cardiovascular;  Laterality: N/A;   LEFT HEART CATH AND CORONARY ANGIOGRAPHY N/A 07/20/2020   Procedure: LEFT HEART CATH AND CORONARY ANGIOGRAPHY;  Surgeon: Lyn Records, MD;;  Cultprit Lesion: 100% Thrombotic proximal RCA occlusion, mid RCA stent.  (DES PCI-3.5 mm x18 mm -3.75 mm, overlapping mid RCA stent). tandem 70% & 50% PDA lesions, 50% PL 1.  Collaterals from distal RCA-> LAD. Patent stent in the RI.  20% ostial & imd LAD -> mid LAD severe diffuse disease followed by CTO.   NASAL SEPTUM SURGERY     SHOULDER ARTHROSCOPY Right    TRANSTHORACIC ECHOCARDIOGRAM  07/21/2020   (Post inferior STEMI) moderately reduced LVEF 30 to 35%.  Apical aneurysm with inferior akinesis and inferolateral/inferoseptal/severe HK.  GR 1 DD.  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  Normal RV, unable to assess pressures.  Mild aortic sclerosis but no stenosis.  Elevated RAP/CVP of estimated 15 mmHg with dilated IVC.    Family History  Problem Relation Age of Onset   Stroke Mother    Heart disease Father     Social History   Socioeconomic History   Marital status: Married    Spouse name: [name]   Number of children: 1   Years of education: Not on file   Highest education level: Not on file  Occupational History   Occupation: retired    Comment: [employer]  Tobacco Use   Smoking status: Former    Packs/day: 2.00    Years:  40.00    Additional pack years: 0.00    Total pack years: 80.00    Types: Cigarettes    Quit date: 01/10/2008    Years since quitting: 14.3   Smokeless tobacco: Never  Vaping Use   Vaping Use: Never used  Substance and Sexual Activity   Alcohol use: Yes    Comment: occasional glass of wine   Drug use: No   Sexual activity: Not Currently  Other Topics Concern   Not on file  Social History Narrative   Lives with his wife (who has dementia) - first floor of apartment complex   Social Determinants of Health   Financial Resource Strain: Low Risk  (07/26/2021)   Overall Financial Resource Strain (CARDIA)    Difficulty of Paying Living Expenses: Not hard at all  Food Insecurity: No Food Insecurity (07/26/2021)   Hunger Vital Sign    Worried About Running Out of Food in the Last Year: Never true  Ran Out of Food in the Last Year: Never true  Transportation Needs: No Transportation Needs (07/26/2021)   PRAPARE - Administrator, Civil Service (Medical): No    Lack of Transportation (Non-Medical): No  Physical Activity: Insufficiently Active (07/26/2021)   Exercise Vital Sign    Days of Exercise per Week: 7 days    Minutes of Exercise per Session: 20 min  Stress: No Stress Concern Present (07/26/2021)   [employer] of Occupational Health - Occupational Stress Questionnaire    Feeling of Stress : Only a little  Social Connections: Moderately Integrated (07/26/2021)   Social Connection and Isolation Panel [NHANES]    Frequency of Communication with Friends and Family: Once a week    Frequency of Social Gatherings with Friends and Family: Once a week    Attends Religious Services: More than 4 times per year    Active Member of [employer] or Organizations: Yes    Attends Engineer, structural: More than 4 times per year    Marital Status: Married  Catering manager Violence: Not At Risk (07/26/2021)   Humiliation, Afraid, Rape, and Kick questionnaire    Fear of Current  or Ex-Partner: No    Emotionally Abused: No    Physically Abused: No    Sexually Abused: No   ROS As per HPI  Objective   BP 112/69   Pulse 62   Temp 98.7 F (37.1 C)   Ht 5' 7.5" (1.715 m)   Wt 178 lb (80.7 kg)   SpO2 96%   BMI 27.47 kg/m   Physical Exam Constitutional:      General: He is not in acute distress.    Appearance: Normal appearance. He is not ill-appearing, toxic-appearing or diaphoretic.  Cardiovascular:     Rate and Rhythm: Normal rate.     Pulses: Normal pulses.     Heart sounds: Normal heart sounds. No murmur heard.    No gallop.  Pulmonary:     Effort: Pulmonary effort is normal. No respiratory distress.     Breath sounds: Normal breath sounds. No stridor. No wheezing, rhonchi or rales.  Musculoskeletal:     Right foot: Normal range of motion. No deformity, bunion, Charcot foot, foot drop or prominent metatarsal heads.     Left foot: Normal range of motion. No deformity, bunion, Charcot foot, foot drop or prominent metatarsal heads.  Feet:     Right foot:     Protective Sensation: 10 sites tested.  10 sites sensed.     Skin integrity: Blister, skin breakdown, callus, dry skin and fissure present.     Toenail Condition: Right toenails are normal.     Left foot:     Protective Sensation: 10 sites tested.  10 sites sensed.     Skin integrity: Blister, skin breakdown, callus, dry skin and fissure present.     Toenail Condition: Left toenails are normal.     Comments: Bilateral skin breakdown on medial bony prominence of great toe  Skin:    General: Skin is warm.     Capillary Refill: Capillary refill takes less than 2 seconds.  Neurological:     General: No focal deficit present.     Mental Status: He is alert and oriented to person, place, and time. Mental status is at baseline.     Motor: No weakness.  Psychiatric:        Mood and Affect: Mood normal.        Behavior: Behavior normal.  Thought Content: Thought content normal.         Judgment: Judgment normal.    Assessment & Plan:  1. Type 2 diabetes mellitus with complication, without long-term current use of insulin Labs as below. A1C almost at goal. Currently at 7.6%. Patient is following at the [place], who is driving his care, but requested labs today. Will communicate results to patient once available. Per patient, he is connected with ophthalmology and [facility]  - Bayer DCA Hb A1c Waived - Microalbumin / creatinine urine ratio - CBC with Differential/Platelet - CMP14+EGFR  2. Hyperlipidemia associated with type 2 diabetes mellitus Labs as below. Will communicate results to patient once available.  Fasting  - Lipid panel  3. Thyroid disease [facility]. However, per patient report they have not monitored his thyroid function in several years. Labs as below. Will communicate results to patient once available. Well controlled symptomatically.  - Thyroid Panel With TSH  4. Callus of foot Discussed with pt proper foot care for diabetes. Provided patient handout. Declined follow up with podiatry. Encouraged him to follow up with podiatry through the [facility]   The above assessment and management plan was discussed with the patient. The patient verbalized understanding of and has agreed to the management plan using shared-decision making. Patient is aware to call the clinic if they develop any new symptoms or if symptoms fail to improve or worsen. Patient is aware when to return to the clinic for a follow-up visit. Patient educated on when it is appropriate to go to the emergency department.   [facility]  Follow up in 1-3 months for chronic condition follow up.   Neale Burly, DNP-FNP [facility] [address] [place], [place] [address] [phone]

## 2022-05-05 LAB — LIPID PANEL
Chol/HDL Ratio: 3 ratio (ref 0.0–5.0)
Cholesterol, Total: 122 mg/dL (ref 100–199)
HDL: 41 mg/dL (ref >39)
LDL Chol Calc (NIH): 63 mg/dL (ref 0–99)
Triglycerides: 95 mg/dL (ref 0–149)
VLDL Cholesterol Cal: 18 mg/dL (ref 5–40)

## 2022-05-05 LAB — CMP14+EGFR
ALT: 16 IU/L (ref 0–44)
AST: 16 IU/L (ref 0–40)
Albumin/Globulin Ratio: 1.6 (ref 1.2–2.2)
Albumin: 4.2 g/dL (ref 3.8–4.8)
Alkaline Phosphatase: 65 IU/L (ref 44–121)
BUN/Creatinine Ratio: 19 (ref 10–24)
BUN: 25 mg/dL (ref 8–27)
Bilirubin Total: 0.3 mg/dL (ref 0.0–1.2)
CO2: 23 mmol/L (ref 20–29)
Calcium: 9.3 mg/dL (ref 8.6–10.2)
Chloride: 103 mmol/L (ref 96–106)
Creatinine, Ser: 1.31 mg/dL — ABNORMAL HIGH (ref 0.76–1.27)
Globulin, Total: 2.7 g/dL (ref 1.5–4.5)
Glucose: 122 mg/dL — ABNORMAL HIGH (ref 70–99)
Potassium: 4.7 mmol/L (ref 3.5–5.2)
Sodium: 140 mmol/L (ref 134–144)
Total Protein: 6.9 g/dL (ref 6.0–8.5)
eGFR: 57 mL/min/{1.73_m2} — ABNORMAL LOW (ref >59)

## 2022-05-05 LAB — CBC WITH DIFFERENTIAL/PLATELET
Basophils Absolute: 0.1 10*3/uL (ref 0.0–0.2)
Basos: 1 %
EOS (ABSOLUTE): 0.2 10*3/uL (ref 0.0–0.4)
Eos: 2 %
Hematocrit: 36 % — ABNORMAL LOW (ref 37.5–51.0)
Hemoglobin: 11.1 g/dL — ABNORMAL LOW (ref 13.0–17.7)
Immature Grans (Abs): 0 10*3/uL (ref 0.0–0.1)
Immature Granulocytes: 0 %
Lymphocytes Absolute: 1.6 10*3/uL (ref 0.7–3.1)
Lymphs: 13 %
MCH: 23.8 pg — ABNORMAL LOW (ref 26.6–33.0)
MCHC: 30.8 g/dL — ABNORMAL LOW (ref 31.5–35.7)
MCV: 77 fL — ABNORMAL LOW (ref 79–97)
Monocytes Absolute: 0.8 10*3/uL (ref 0.1–0.9)
Monocytes: 6 %
Neutrophils Absolute: 9.3 10*3/uL — ABNORMAL HIGH (ref 1.4–7.0)
Neutrophils: 78 %
Platelets: 208 10*3/uL (ref 150–450)
RBC: 4.67 x10E6/uL (ref 4.14–5.80)
RDW: 15.6 % — ABNORMAL HIGH (ref 11.6–15.4)
WBC: 12 10*3/uL — ABNORMAL HIGH (ref 3.4–10.8)

## 2022-05-05 LAB — MICROALBUMIN / CREATININE URINE RATIO
Creatinine, Urine: 85.9 mg/dL
Microalb/Creat Ratio: 4 mg/g creat (ref 0–29)
Microalbumin, Urine: 3.8 ug/mL

## 2022-05-05 LAB — THYROID PANEL WITH TSH
Free Thyroxine Index: 2.3 (ref 1.2–4.9)
T3 Uptake Ratio: 35 % (ref 24–39)
T4, Total: 6.5 ug/dL (ref 4.5–12.0)
TSH: 0.962 u[IU]/mL (ref 0.450–4.500)

## 2022-05-05 NOTE — Addendum Note (Signed)
Addended by: Neale Burly on: 05/05/2022 04:59 PM   Modules accepted: Orders

## 2022-05-09 ENCOUNTER — Other Ambulatory Visit: Payer: Self-pay

## 2022-05-09 ENCOUNTER — Other Ambulatory Visit: Payer: Medicare HMO

## 2022-05-09 DIAGNOSIS — R6889 Other general symptoms and signs: Secondary | ICD-10-CM | POA: Diagnosis not present

## 2022-05-09 DIAGNOSIS — D582 Other hemoglobinopathies: Secondary | ICD-10-CM

## 2022-05-10 ENCOUNTER — Other Ambulatory Visit: Payer: Self-pay

## 2022-05-10 LAB — IRON,TIBC AND FERRITIN PANEL
Ferritin: 15 ng/mL — ABNORMAL LOW (ref 30–400)
Iron Saturation: 20 % (ref 15–55)
Iron: 78 ug/dL (ref 38–169)
Total Iron Binding Capacity: 399 ug/dL (ref 250–450)
UIBC: 321 ug/dL (ref 111–343)

## 2022-05-12 ENCOUNTER — Telehealth: Payer: Medicare HMO

## 2022-05-22 ENCOUNTER — Other Ambulatory Visit (HOSPITAL_COMMUNITY): Payer: Self-pay | Admitting: Nephrology

## 2022-05-22 DIAGNOSIS — I129 Hypertensive chronic kidney disease with stage 1 through stage 4 chronic kidney disease, or unspecified chronic kidney disease: Secondary | ICD-10-CM | POA: Diagnosis not present

## 2022-05-22 DIAGNOSIS — E1122 Type 2 diabetes mellitus with diabetic chronic kidney disease: Secondary | ICD-10-CM | POA: Diagnosis not present

## 2022-05-22 DIAGNOSIS — N189 Chronic kidney disease, unspecified: Secondary | ICD-10-CM

## 2022-05-22 DIAGNOSIS — N1831 Chronic kidney disease, stage 3a: Secondary | ICD-10-CM | POA: Diagnosis not present

## 2022-05-22 DIAGNOSIS — I5042 Chronic combined systolic (congestive) and diastolic (congestive) heart failure: Secondary | ICD-10-CM | POA: Diagnosis not present

## 2022-05-29 DIAGNOSIS — I2510 Atherosclerotic heart disease of native coronary artery without angina pectoris: Secondary | ICD-10-CM | POA: Diagnosis not present

## 2022-05-29 DIAGNOSIS — N200 Calculus of kidney: Secondary | ICD-10-CM | POA: Diagnosis not present

## 2022-05-29 DIAGNOSIS — E1122 Type 2 diabetes mellitus with diabetic chronic kidney disease: Secondary | ICD-10-CM | POA: Diagnosis not present

## 2022-05-29 DIAGNOSIS — I5042 Chronic combined systolic (congestive) and diastolic (congestive) heart failure: Secondary | ICD-10-CM | POA: Diagnosis not present

## 2022-05-29 DIAGNOSIS — I129 Hypertensive chronic kidney disease with stage 1 through stage 4 chronic kidney disease, or unspecified chronic kidney disease: Secondary | ICD-10-CM | POA: Diagnosis not present

## 2022-05-29 DIAGNOSIS — D638 Anemia in other chronic diseases classified elsewhere: Secondary | ICD-10-CM | POA: Diagnosis not present

## 2022-05-29 DIAGNOSIS — N189 Chronic kidney disease, unspecified: Secondary | ICD-10-CM | POA: Diagnosis not present

## 2022-05-29 DIAGNOSIS — N1831 Chronic kidney disease, stage 3a: Secondary | ICD-10-CM | POA: Diagnosis not present

## 2022-06-01 ENCOUNTER — Ambulatory Visit (INDEPENDENT_AMBULATORY_CARE_PROVIDER_SITE_OTHER): Payer: Medicare HMO | Admitting: Family Medicine

## 2022-06-01 ENCOUNTER — Encounter: Payer: Self-pay | Admitting: Family Medicine

## 2022-06-01 VITALS — BP 90/47 | HR 56 | Temp 97.1°F | Ht 67.5 in | Wt 175.4 lb

## 2022-06-01 DIAGNOSIS — I959 Hypotension, unspecified: Secondary | ICD-10-CM | POA: Diagnosis not present

## 2022-06-01 DIAGNOSIS — E785 Hyperlipidemia, unspecified: Secondary | ICD-10-CM

## 2022-06-01 DIAGNOSIS — I255 Ischemic cardiomyopathy: Secondary | ICD-10-CM

## 2022-06-01 DIAGNOSIS — I25110 Atherosclerotic heart disease of native coronary artery with unstable angina pectoris: Secondary | ICD-10-CM

## 2022-06-01 DIAGNOSIS — I252 Old myocardial infarction: Secondary | ICD-10-CM

## 2022-06-01 DIAGNOSIS — E1169 Type 2 diabetes mellitus with other specified complication: Secondary | ICD-10-CM | POA: Diagnosis not present

## 2022-06-01 DIAGNOSIS — Z1331 Encounter for screening for depression: Secondary | ICD-10-CM

## 2022-06-01 DIAGNOSIS — E118 Type 2 diabetes mellitus with unspecified complications: Secondary | ICD-10-CM

## 2022-06-01 DIAGNOSIS — I10 Essential (primary) hypertension: Secondary | ICD-10-CM | POA: Diagnosis not present

## 2022-06-01 NOTE — Progress Notes (Signed)
Acute Office Visit  Subjective:  Patient ID: Gregory Cole, male    DOB: Jun 06, 1950, 72 y.o.   MRN: [id number]  Chief Complaint  Patient presents with   Medical Management of Chronic Issues   HPI Patient is in today for follow up of chronic conditions. [facility], [facility]. States that he took all of his medications this morning, but has not eating yet today.   Type 2 Diabetes with CAD, HLD Glucometer: Accucheck .   High at home: 120; Low at home: 60, Taking medication(s): empaglozin, linaglip, metformin,.  Last eye exam: due through [place]  Last foot exam: completed last visit  Last A1c:  Lab Results  Component Value Date   HGBA1C 7.6 (H) 05/04/2022   Nephropathy screen indicated?: followed by Nephrology, [facility], records requested  Last flu, zoster and/or pneumovax:  Immunization History  Administered Date(s) Administered   Fluad Quad(high Dose 65+) 09/30/2018   Influenza-Unspecified 10/20/2020   Moderna Sars-Covid-2 Vaccination 03/06/2019, 03/31/2019, 08/28/2019, 04/09/2020, 10/07/2020   Pneumococcal Conjugate-13 03/18/2020   Pneumococcal Polysaccharide-23 12/01/2009   Tdap 04/20/2021   Zoster Recombinat (Shingrix) 09/09/2020, 12/06/2020   ROS: Denies dizziness, LOC, polyuria, polydipsia, unintended weight loss/gain, foot ulcerations, numbness or tingling in extremities, shortness of breath or chest pain.  Anxiety/Depression States that he feels drained all the time. Started "quite a while back" States that he is "under a lot of stress"  Reports that his wife has dementia, his sister passed and lawyer is moving slow. Family discord.   Care Team [facility]  Cardiology  Endocrinology  Podiatrist retired  PCP  Completes labwork there.  Had appt with Wolfgang Phoenix this week Had labs drawn there.   ROS As per HPI   Objective:  BP (!) 90/47   Pulse (!) 56   Temp (!) 97.1 F (36.2 C)   Ht 5' 7.5" (1.715 m)   Wt 175 lb 6.4 oz (79.6 kg)   SpO2 96%    BMI 27.07 kg/m    Physical Exam Constitutional:      General: He is awake. He is not in acute distress.    Appearance: Normal appearance. He is well-developed and well-groomed. He is not ill-appearing, toxic-appearing or diaphoretic.  Cardiovascular:     Rate and Rhythm: Normal rate.     Pulses: Normal pulses.          Radial pulses are 2+ on the right side and 2+ on the left side.       Posterior tibial pulses are 2+ on the right side and 2+ on the left side.     Heart sounds: Normal heart sounds. No murmur heard.    No gallop.  Pulmonary:     Effort: Pulmonary effort is normal. No respiratory distress.     Breath sounds: Normal breath sounds. No stridor. No wheezing, rhonchi or rales.  Musculoskeletal:     Cervical back: Full passive range of motion without pain and neck supple.     Right lower leg: No edema.     Left lower leg: No edema.  Skin:    General: Skin is warm.     Capillary Refill: Capillary refill takes less than 2 seconds.  Neurological:     General: No focal deficit present.     Mental Status: He is alert, oriented to person, place, and time and easily aroused. Mental status is at baseline.     GCS: GCS eye subscore is 4. GCS verbal subscore is 5. GCS  motor subscore is 6.     Motor: No weakness.  Psychiatric:        Attention and Perception: Attention and perception normal.        Mood and Affect: Mood and affect normal.        Speech: Speech normal.        Behavior: Behavior normal. Behavior is cooperative.        Thought Content: Thought content normal. Thought content does not include homicidal or suicidal ideation. Thought content does not include homicidal or suicidal plan.        Cognition and Memory: Cognition and memory normal.        Judgment: Judgment normal.       06/01/2022    9:58 AM 06/01/2022    9:50 AM 05/04/2022    9:57 AM  Depression screen PHQ 2/9  Decreased Interest 1 0 0  Down, Depressed, Hopeless 1 0 0  PHQ - 2 Score 2 0 0  Altered  sleeping 1  1  Tired, decreased energy 1  1  Change in appetite 0  0  Feeling bad or failure about yourself  1  1  Trouble concentrating 0  0  Moving slowly or fidgety/restless 0  0  Suicidal thoughts 0  0  PHQ-9 Score 5  3  Difficult doing work/chores Not difficult at all  Somewhat difficult      06/01/2022    9:59 AM 05/04/2022    9:59 AM  GAD 7 : Generalized Anxiety Score  Nervous, Anxious, on Edge 0 0  Control/stop worrying 1 1  Worry too much - different things 0 0  Trouble relaxing 0 0  Restless 0 0  Easily annoyed or irritable 0 0  Afraid - awful might happen 1 1  Total GAD 7 Score 2 2  Anxiety Difficulty Not difficult at all    Assessment & Plan:  1. Coronary artery disease involving native coronary artery of native heart with unstable angina pectoris [facility]) Patient is on GDMT. Followed by Cardiology at [facility]. Discussed diet and lifestyle changes.   2. Hypotensive episode 3. Primary hypertension Pt BP low today with symptoms of fatigue. Instructed patient to half his dose of coreg and follow up with Cardiology at [facility] to discuss dose of Entresto. Instructed patient to call with medications and dosages that he is taking at home, so that we can make adjustments as needed. Encouraged patient to keep a daily log of his BP and to follow up closely for BP.   4. Ischemic cardiomyopathy -> in setting of inferior STEMI, LV gram EF of 35 to 40% inferior wall hypokinesis.  LVEDP 26. Patient is on GDMT. Followed by Cardiology at [facility].   5. Type 2 diabetes mellitus with complication, without long-term current use of insulin (HCC) Discussed increased monitoring of BG at home. At least daily monitoring. Patient is managed by Endocrinology at the [place]. However, he feels that it is difficult to get into appts, so he would like to be managed by [facility] as well. Instructed patient to call with medications and dosages that he is taking at home, so that we can make adjustments as needed.   6.  Hyperlipidemia associated with type 2 diabetes mellitus (HCC) Will monitor labs in 2 months at follow up. Is currently on statin   7. History of non-ST elevation myocardial infarction (NSTEMI) Patient is on GDMT. Followed by Cardiology at [facility].   8. Encounter for screening for depression Pt screened positive for  depression today. Pt offered nonpharmacologic and pharmacologic therapy. Pt declined initiating treatment at this time. Safety contract established today with patient in clinic. Denies intent to harm herself or others. Pt to notify provider if they would like to initiate treatment.   The above assessment and management plan was discussed with the patient. The patient verbalized understanding of and has agreed to the management plan using shared-decision making. Patient is aware to call the clinic if they develop any new symptoms or if symptoms fail to improve or worsen. Patient is aware when to return to the clinic for a follow-up visit. Patient educated on when it is appropriate to go to the emergency department.   Return in about 3 months (around 09/01/2022).  Neale Burly, DNP-FNP [facility] [address] [place], [place] [address] [phone]

## 2022-06-02 ENCOUNTER — Ambulatory Visit (HOSPITAL_COMMUNITY)
Admission: RE | Admit: 2022-06-02 | Discharge: 2022-06-02 | Disposition: A | Discharge status: Home / Self Care | Payer: Medicare HMO | Source: Ambulatory Visit | Attending: Nephrology | Admitting: Nephrology

## 2022-06-02 DIAGNOSIS — N189 Chronic kidney disease, unspecified: Secondary | ICD-10-CM | POA: Insufficient documentation

## 2022-06-07 ENCOUNTER — Other Ambulatory Visit: Payer: Self-pay | Admitting: *Deleted

## 2022-06-07 NOTE — Patient Outreach (Addendum)
  Care Coordination   06/07/2022  Name: Gregory Cole MRN: [id number] DOB: 1950-07-31   Care Coordination Outreach Attempts:  An unsuccessful telephone outreach was attempted today to offer the patient information about available care coordination services.  HIPAA compliant message left on voicemail, providing contact information for CSW, encouraging patient to return CSW's call at his earliest convenience.  Follow Up Plan:  Additional outreach attempts will be made to offer the patient care coordination information and services.   Encounter Outcome:  No Answer.   Care Coordination Interventions:  No, not indicated.    Danford Bad, BSW, MSW, LCSW  Licensed Restaurant manager, fast food [facility]  Mailing [place] [address], [place], [place] [address] Physical Address-[address] [place], [place] [address] Toll Free Main # [phone] Fax # [phone] Cell # [phone] [email or web address]

## 2022-07-03 DIAGNOSIS — N2581 Secondary hyperparathyroidism of renal origin: Secondary | ICD-10-CM | POA: Diagnosis not present

## 2022-07-03 DIAGNOSIS — N1831 Chronic kidney disease, stage 3a: Secondary | ICD-10-CM | POA: Diagnosis not present

## 2022-07-03 DIAGNOSIS — I5042 Chronic combined systolic (congestive) and diastolic (congestive) heart failure: Secondary | ICD-10-CM | POA: Diagnosis not present

## 2022-07-03 DIAGNOSIS — D509 Iron deficiency anemia, unspecified: Secondary | ICD-10-CM | POA: Diagnosis not present

## 2022-07-03 DIAGNOSIS — R768 Other specified abnormal immunological findings in serum: Secondary | ICD-10-CM | POA: Diagnosis not present

## 2022-07-04 ENCOUNTER — Other Ambulatory Visit: Payer: Self-pay

## 2022-07-04 DIAGNOSIS — D500 Iron deficiency anemia secondary to blood loss (chronic): Secondary | ICD-10-CM

## 2022-07-04 DIAGNOSIS — D509 Iron deficiency anemia, unspecified: Secondary | ICD-10-CM | POA: Insufficient documentation

## 2022-07-12 ENCOUNTER — Telehealth: Payer: Self-pay | Admitting: Pharmacy Technician

## 2022-07-12 NOTE — Telephone Encounter (Addendum)
Dr. Wolfgang Phoenix,  Patient does not have a Community of Care referral/approval on file for iron infusions. Patient will need hematology referral on file from [place]. He will need his MD at the [place] to give approval to be seen out-side of [place].  VARomie Jumper Phone: [phone]  [facility]

## 2022-07-20 ENCOUNTER — Encounter (HOSPITAL_COMMUNITY): Payer: Self-pay | Admitting: Nephrology

## 2022-07-25 ENCOUNTER — Other Ambulatory Visit: Payer: Self-pay

## 2022-07-26 ENCOUNTER — Other Ambulatory Visit: Payer: Self-pay

## 2022-07-28 ENCOUNTER — Ambulatory Visit: Payer: No Typology Code available for payment source | Admitting: Gastroenterology

## 2022-07-29 NOTE — Progress Notes (Unsigned)
Referring Provider: Johnn Hai, NP Primary Care Physician:  Arrie Senate, FNP Primary Gastroenterologist:  Dr. Jena Gauss  Chief Complaint  Patient presents with   Low iron     May need an iron infusion     HPI:   Gregory Cole is a 72 y.o. male with history of CAD, MI's, HF with reduced EF, diabetes, HTN, HLD, postablative hypothyroidism, glaucoma, CKD, GERD, IDA, presenting today at the request of Johnn Hai, NP for IDA.   Hemoglobin in the 11-12 range since March 2021. Most recent Hgb 11.1 05/04/22.   05/09/22: Ferritin low at 15, iron saturation 20, iron 78.  Today:  Denies brbpr, melena, or hematuria. Occasionally, late at night he will have heartburn and will have to get up and vomit, but last occurred a couple months ago. Feels that his sinuses end up causing this as it causes a lot of coughing. No dysphagia or abdominal pain. Bowels move well.   Started oral iron daily in April.  No NSAID use aside from 81 mg aspirin.   Prior EGD/colonoscopy:  Has had several colonoscopies through the [place]. Last one was probably 3-4 years ago. Has had polyps removed. Had bowel perforation after last colonoscopy. Reports having 1 foot of intestine removed. Doesn't want to have another colonoscopy.   [facility], but didn't have a driver.  Has had 1 EGD in the past. Many years ago. Was told he has barretts esophagus.    Reviewed care everywhere.  Appears that patient underwent exploratory laparotomy and ileocectomy 2/2 colonic perforation during colonoscopy on 03/20/2018.   Past Medical History:  Diagnosis Date   Chronic systolic heart failure (HCC)    CKD (chronic kidney disease)    Coronary artery disease    Diabetes mellitus without complication (HCC)    GERD (gastroesophageal reflux disease)    Glaucoma    Hypercholesteremia    Hypertension    Lesion of lung    [facility]   Myocardial  infarction (HCC)    x3   Postablative hypothyroidism 03/18/2020   Sleep apnea    unable to tolerate CPAP    Past Surgical History:  Procedure Laterality Date   BOWEL RESECTION     CARDIAC CATHETERIZATION     2 stents in heart   CATARACT EXTRACTION W/PHACO Left 07/01/2013   Procedure: CATARACT EXTRACTION PHACO AND INTRAOCULAR LENS PLACEMENT (IOC);  Surgeon: Loraine Leriche T. Nile Riggs, MD;  Location: [facility];  Service: Ophthalmology;  Laterality: Left;  CDE 6.84   CORONARY STENT INTERVENTION N/A 04/07/2019   Procedure: CORONARY STENT INTERVENTION;  Surgeon: Lennette Bihari, MD;  Location: [facility]; mRCA 80% -> STENT RESOLUTE ONYX 3.5X22 (3.6 mm)   CORONARY/GRAFT ACUTE MI REVASCULARIZATION N/A 07/20/2020   Procedure: Coronary/Graft Acute MI Revascularization - Coronary Stent Intervention;  Surgeon: Lyn Records, MD;  Location: [facility];  Service: Cardiovascular;; proximal RCA 100% occlusion (proximal to previous stent)-3.5 mm 18 mm Onyx Frontier DES (3.75 mm)-overlaps previous stent.   HAND SURGERY Left    LEFT HEART CATH AND CORONARY ANGIOGRAPHY N/A 04/07/2019   Procedure: LEFT HEART CATH AND CORONARY ANGIOGRAPHY;  Surgeon: Lennette Bihari, MD;  Location: [facility];  Service: Cardiovascular;  Laterality: N/A;   LEFT HEART CATH AND CORONARY ANGIOGRAPHY N/A 07/20/2020   Procedure: LEFT HEART CATH AND CORONARY ANGIOGRAPHY;  Surgeon: Lyn Records, MD;;  Cultprit Lesion: 100% Thrombotic proximal RCA occlusion, mid RCA stent.  (DES PCI-3.5 mm x18 mm -3.75 mm, overlapping mid RCA stent). tandem 70% & 50% PDA lesions, 50% PL 1.  Collaterals from distal RCA-> LAD. Patent stent in the RI.  20% ostial & imd LAD -> mid LAD severe diffuse disease followed by CTO.   NASAL SEPTUM SURGERY     SHOULDER ARTHROSCOPY Right    TRANSTHORACIC ECHOCARDIOGRAM  07/21/2020   (Post inferior STEMI) moderately reduced LVEF 30 to 35%.  Apical aneurysm with inferior akinesis and  inferolateral/inferoseptal/severe HK.  GR 1 DD.  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  Normal RV, unable to assess pressures.  Mild aortic sclerosis but no stenosis.  Elevated RAP/CVP of estimated 15 mmHg with dilated IVC.    Current Outpatient Medications  Medication Sig Dispense Refill   Alogliptin Benzoate 25 MG TABS Take 12.5 mg by mouth daily.     aspirin EC 81 MG tablet Take 81 mg by mouth daily. Evening 1600     carvedilol (COREG) 12.5 MG tablet Take 6.25 mg by mouth 2 (two) times daily with a meal.     clopidogrel (PLAVIX) 75 MG tablet Take 1 tablet (75 mg total) by mouth daily. 30 tablet 1   Empagliflozin-Linaglip-Metform 12.5-2.05-998 MG TB24 Take by mouth in the morning and at bedtime.     ferrous sulfate 325 (65 FE) MG tablet Take 325 mg by mouth daily with breakfast.     levothyroxine (SYNTHROID, LEVOTHROID) 88 MCG tablet Take 88 mcg by mouth daily before breakfast.     pantoprazole (PROTONIX) 40 MG tablet Take 1 tablet (40 mg total) by mouth daily. 30 tablet 11   sacubitril-valsartan (ENTRESTO) 49-51 MG Take 1 tablet by mouth 2 (two) times daily. 60 tablet 1   atorvastatin (LIPITOR) 80 MG tablet Take 1 tablet (80 mg total) by mouth daily at 6 PM. (Patient not taking: Reported on 08/02/2022) 30 tablet 0   furosemide (LASIX) 40 MG tablet Take 1 tablet (40 mg total) by mouth daily as needed (For weight gain > 3 lbs overnight.). (Patient not taking: Reported on 07/26/2021) 30 tablet 1   nitroGLYCERIN (NITROSTAT) 0.4 MG SL tablet Place under the tongue. (Patient not taking: Reported on 08/02/2022)     No current facility-administered medications for this visit.    Allergies as of 08/02/2022   (No Known Allergies)    Family History  Problem Relation Age of Onset   Stroke Mother    Heart disease Father    Colon cancer Neg Hx     Social History   Socioeconomic History   Marital status: Married    Spouse name:  [name]   Number of children: 1   Years of education: Not on file   Highest education level: Not on file  Occupational History   Occupation: retired    Comment: [employer]  Tobacco Use   Smoking status: Former    Current packs/day: 0.00    Average packs/day: 2.0 packs/day for 40.0 years (80.0 ttl pk-yrs)    Types: Cigarettes    Start date: 01/10/1968    Quit date: 01/10/2008    Years since quitting: 14.5   Smokeless tobacco: Never  Vaping Use   Vaping status: Never Used  Substance and Sexual Activity   Alcohol use: Yes    Comment: occasional glass of wine   Drug use: No   Sexual activity: Not Currently  Other Topics Concern   Not on file  Social History Narrative   Lives with his wife (who has dementia) - first floor of apartment complex   Social Determinants of Health   Financial Resource Strain: Low Risk  (07/31/2022)   Overall Financial Resource Strain (CARDIA)    Difficulty of Paying Living Expenses: Not hard at all  Food Insecurity: No Food Insecurity (07/31/2022)   Hunger Vital Sign    Worried About Running Out of Food in the Last Year: Never true    Ran Out of Food in the Last Year: Never true  Transportation Needs: No Transportation Needs (07/31/2022)   PRAPARE - Administrator, Civil Service (Medical): No    Lack of Transportation (Non-Medical): No  Physical Activity: Insufficiently Active (07/31/2022)   Exercise Vital Sign    Days of Exercise per Week: 3 days    Minutes of Exercise per Session: 30 min  Stress: No Stress Concern Present (07/31/2022)   [employer] of Occupational Health - Occupational Stress Questionnaire    Feeling of Stress : Not at all  Social Connections: Moderately Isolated (07/31/2022)   Social Connection and Isolation Panel [NHANES]    Frequency of Communication with Friends and Family: More than three times a week    Frequency of Social Gatherings with Friends and Family: More than three times a week    Attends Religious Services:  Never    Database administrator or Organizations: No    Attends Banker Meetings: Never    Marital Status: Married  Catering manager Violence: Not At Risk (07/31/2022)   Humiliation, Afraid, Rape, and Kick questionnaire    Fear of Current or Ex-Partner: No    Emotionally Abused: No    Physically Abused: No    Sexually Abused: No    Review of Systems: Gen: Denies any fever, chills, cold or flulike symptoms, presyncope, syncope. CV: Denies chest pain, heart palpitations. Resp: Denies shortness of breath, cough. GI: See HPI GU : Denies urinary burning, urinary frequency, urinary hesitancy MS: Denies joint pain. Derm: Denies rash. Psych: Denies depression, anxiety. Heme: See HPI  Physical Exam: BP 131/77 (BP Location: Right Arm, Patient Position: Sitting, Cuff Size: Normal)   Pulse (!) 55   Temp 97.9 F (36.6 C) (Temporal)   Ht 5' 7.5" (1.715 m)   Wt 178 lb 6.4 oz (80.9 kg)   SpO2 95%   BMI 27.53 kg/m  General:   Alert and oriented. Pleasant and cooperative. Well-nourished and well-developed.  Head:  Normocephalic and atraumatic. Eyes:  Without icterus, sclera clear and conjunctiva pink.  Ears:  Normal auditory acuity. Lungs:  Clear to auscultation bilaterally. No wheezes, rales, or rhonchi. No distress.  Heart:  S1, S2 present without murmurs appreciated.  Abdomen:  +BS, soft, non-tender and non-distended. No HSM noted. No guarding or rebound. No masses appreciated.  Rectal:  Deferred  Msk:  Symmetrical without gross deformities. Normal posture. Extremities:  Without edema. Neurologic:  Alert and  oriented x4;  grossly normal neurologically. Skin:  Intact without significant lesions or rashes. Psych:  Normal mood and affect.    Assessment:  72 y.o. male with history of CAD, MI's, s/p PCI with stent placement on Plavix and aspirin, HF with reduced EF, diabetes, HTN, HLD, postablative hypothyroidism, glaucoma, CKD, GERD, Barrett's esophagus, IDA, presenting  today at the request of Johnn Hai, NP for IDA.   IDA:  Chronic anemia with hemoglobin in the 11-12 range since March 2021.  Most recent hemoglobin was 11.1 in April.  He was found to have low ferritin of 15, iron saturation 20, iron 78 in April as well.  He denies overt GI bleeding or hematuria.  No significant GI symptoms.  Chronic GERD is fairly well-controlled on pantoprazole 40 mg daily.  Reports history of Barrett's esophagus with last EGD many years ago and history of colon polyps with last colonoscopy in 2020.  He experienced colonic perforation during his last colonoscopy at the [facility] and was transferred to [facility] where he underwent laparotomy with ileocecectomy.  He states he is not interested in ever having another colonoscopy.  Etiology of iron deficiency anemia is not clear.  He does have chronic kidney disease which could be contributing.  However, he could have slow, occult GI bleeding from essentially anywhere in his GI tract in the setting of dual antiplatelet therapy with aspirin and Plavix.  We discussed the risk of foregoing colonoscopy, but patient is only willing to proceed with an EGD at this time. States he may reconsider if needed after EGD.   As his las blood work was in April and he has been on oral iron since that time, will go ahead and update labs. Will also check B12 in light of ileocecectomy.  Barrett's esophagus: Patient reports history of Barrett's esophagus with last EGD many years ago through the [place].  This will be re-evaluated at the time of upcoming EGD.    Plan:  Proceed with upper endoscopy with propofol by Dr. Jena Gauss in near future. The risks, benefits, and alternatives have been discussed with the patient in detail. The patient states understanding and desires to proceed.  ASA 3+ No morning diabetes medications day of procedure. CBC, iron panel, B12 Continue oral iron daily. Continue Protonix daily. Follow-up after EGD.   Ermalinda Memos,  PA-C [facility] 08/02/2022

## 2022-07-31 ENCOUNTER — Ambulatory Visit (INDEPENDENT_AMBULATORY_CARE_PROVIDER_SITE_OTHER): Payer: Medicare HMO

## 2022-07-31 VITALS — Ht 67.0 in | Wt 175.0 lb

## 2022-07-31 DIAGNOSIS — Z0000 Encounter for general adult medical examination without abnormal findings: Secondary | ICD-10-CM

## 2022-07-31 NOTE — Patient Instructions (Signed)
Gregory Cole , Thank you for taking time to come for your Medicare Wellness Visit. I appreciate your ongoing commitment to your health goals. Please review the following plan we discussed and let me know if I can assist you in the future.   These are the goals we discussed:  Goals      DIET - INCREASE WATER INTAKE     HEMOGLOBIN A1C < 7     Patient Stated     Hopes to stay healthy and well enough to continue taking care of wife who has dementia        This is a list of the screening recommended for you and due dates:  Health Maintenance  Topic Date Due   Eye exam for diabetics  05/09/2019   COVID-19 Vaccine (6 - 2023-24 season) 09/09/2021   Screening for Lung Cancer  05/04/2023*   Hepatitis C Screening  05/04/2023*   Flu Shot  08/10/2022   Hemoglobin A1C  11/03/2022   Yearly kidney function blood test for diabetes  05/04/2023   Yearly kidney health urinalysis for diabetes  05/04/2023   Complete foot exam   05/04/2023   Medicare Annual Wellness Visit  07/31/2023   Pneumonia Vaccine (3 of 3 - PPSV23 or PCV20) 03/18/2025   Colon Cancer Screening  03/19/2028   DTaP/Tdap/Td vaccine (2 - Td or Tdap) 04/21/2031   Zoster (Shingles) Vaccine  Completed   HPV Vaccine  Aged Out  *Topic was postponed. The date shown is not the original due date.    Advanced directives: Advance directive discussed with you today. I have provided a copy for you to complete at home and have notarized. Once this is complete please bring a copy in to our office so we can scan it into your chart.   Conditions/risks identified: Aim for 30 minutes of exercise or brisk walking, 6-8 glasses of water, and 5 servings of fruits and vegetables each day.   Next appointment: Follow up in one year for your annual wellness visit.   Preventive Care 74 Years and Older, Male  Preventive care refers to lifestyle choices and visits with your health care provider that can promote health and wellness. What does preventive care  include? A yearly physical exam. This is also called an annual well check. Dental exams once or twice a year. Routine eye exams. Ask your health care provider how often you should have your eyes checked. Personal lifestyle choices, including: Daily care of your teeth and gums. Regular physical activity. Eating a healthy diet. Avoiding tobacco and drug use. Limiting alcohol use. Practicing safe sex. Taking low doses of aspirin every day. Taking vitamin and mineral supplements as recommended by your health care provider. What happens during an annual well check? The services and screenings done by your health care provider during your annual well check will depend on your age, overall health, lifestyle risk factors, and family history of disease. Counseling  Your health care provider may ask you questions about your: Alcohol use. Tobacco use. Drug use. Emotional well-being. Home and relationship well-being. Sexual activity. Eating habits. History of falls. Memory and ability to understand (cognition). Work and work Astronomer. Screening  You may have the following tests or measurements: Height, weight, and BMI. Blood pressure. Lipid and cholesterol levels. These may be checked every 5 years, or more frequently if you are over 41 years old. Skin check. Lung cancer screening. You may have this screening every year starting at age 66 if you have a 30-pack-year  history of smoking and currently smoke or have quit within the past 15 years. Fecal occult blood test (FOBT) of the stool. You may have this test every year starting at age 64. Flexible sigmoidoscopy or colonoscopy. You may have a sigmoidoscopy every 5 years or a colonoscopy every 10 years starting at age 71. Prostate cancer screening. Recommendations will vary depending on your family history and other risks. Hepatitis C blood test. Hepatitis B blood test. Sexually transmitted disease (STD) testing. Diabetes screening. This  is done by checking your blood sugar (glucose) after you have not eaten for a while (fasting). You may have this done every 1-3 years. Abdominal aortic aneurysm (AAA) screening. You may need this if you are a current or former smoker. Osteoporosis. You may be screened starting at age 98 if you are at high risk. Talk with your health care provider about your test results, treatment options, and if necessary, the need for more tests. Vaccines  Your health care provider may recommend certain vaccines, such as: Influenza vaccine. This is recommended every year. Tetanus, diphtheria, and acellular pertussis (Tdap, Td) vaccine. You may need a Td booster every 10 years. Zoster vaccine. You may need this after age 11. Pneumococcal 13-valent conjugate (PCV13) vaccine. One dose is recommended after age 20. Pneumococcal polysaccharide (PPSV23) vaccine. One dose is recommended after age 38. Talk to your health care provider about which screenings and vaccines you need and how often you need them. This information is not intended to replace advice given to you by your health care provider. Make sure you discuss any questions you have with your health care provider. Document Released: 01/22/2015 Document Revised: 09/15/2015 Document Reviewed: 10/27/2014 Elsevier Interactive Patient Education  2017 [employer].  Fall Prevention in the Home Falls can cause injuries. They can happen to people of all ages. There are many things you can do to make your home safe and to help prevent falls. What can I do on the outside of my home? Regularly fix the edges of walkways and driveways and fix any cracks. Remove anything that might make you trip as you walk through a door, such as a raised step or threshold. Trim any bushes or trees on the path to your home. Use bright outdoor lighting. Clear any walking paths of anything that might make someone trip, such as rocks or tools. Regularly check to see if handrails are  loose or broken. Make sure that both sides of any steps have handrails. Any raised decks and porches should have guardrails on the edges. Have any leaves, snow, or ice cleared regularly. Use sand or salt on walking paths during winter. Clean up any spills in your garage right away. This includes oil or grease spills. What can I do in the bathroom? Use night lights. Install grab bars by the toilet and in the tub and shower. Do not use towel bars as grab bars. Use non-skid mats or decals in the tub or shower. If you need to sit down in the shower, use a plastic, non-slip stool. Keep the floor dry. Clean up any water that spills on the floor as soon as it happens. Remove soap buildup in the tub or shower regularly. Attach bath mats securely with double-sided non-slip rug tape. Do not have throw rugs and other things on the floor that can make you trip. What can I do in the bedroom? Use night lights. Make sure that you have a light by your bed that is easy to reach. Do  not use any sheets or blankets that are too big for your bed. They should not hang down onto the floor. Have a firm chair that has side arms. You can use this for support while you get dressed. Do not have throw rugs and other things on the floor that can make you trip. What can I do in the kitchen? Clean up any spills right away. Avoid walking on wet floors. Keep items that you use a lot in easy-to-reach places. If you need to reach something above you, use a strong step stool that has a grab bar. Keep electrical cords out of the way. Do not use floor polish or wax that makes floors slippery. If you must use wax, use non-skid floor wax. Do not have throw rugs and other things on the floor that can make you trip. What can I do with my stairs? Do not leave any items on the stairs. Make sure that there are handrails on both sides of the stairs and use them. Fix handrails that are broken or loose. Make sure that handrails are as  long as the stairways. Check any carpeting to make sure that it is firmly attached to the stairs. Fix any carpet that is loose or worn. Avoid having throw rugs at the top or bottom of the stairs. If you do have throw rugs, attach them to the floor with carpet tape. Make sure that you have a light switch at the top of the stairs and the bottom of the stairs. If you do not have them, ask someone to add them for you. What else can I do to help prevent falls? Wear shoes that: Do not have high heels. Have rubber bottoms. Are comfortable and fit you well. Are closed at the toe. Do not wear sandals. If you use a stepladder: Make sure that it is fully opened. Do not climb a closed stepladder. Make sure that both sides of the stepladder are locked into place. Ask someone to hold it for you, if possible. Clearly mark and make sure that you can see: Any grab bars or handrails. First and last steps. Where the edge of each step is. Use tools that help you move around (mobility aids) if they are needed. These include: Canes. Walkers. Scooters. Crutches. Turn on the lights when you go into a dark area. Replace any light bulbs as soon as they burn out. Set up your furniture so you have a clear path. Avoid moving your furniture around. If any of your floors are uneven, fix them. If there are any pets around you, be aware of where they are. Review your medicines with your doctor. Some medicines can make you feel dizzy. This can increase your chance of falling. Ask your doctor what other things that you can do to help prevent falls. This information is not intended to replace advice given to you by your health care provider. Make sure you discuss any questions you have with your health care provider. Document Released: 10/22/2008 Document Revised: 06/03/2015 Document Reviewed: 01/30/2014 Elsevier Interactive Patient Education  2017 [employer].

## 2022-07-31 NOTE — Progress Notes (Signed)
Subjective:   Gregory Cole is a 72 y.o. male who presents for Medicare Annual/Subsequent preventive examination.  Visit Complete: Virtual  I connected with  Gregory Cole on 07/31/22 by a audio enabled telemedicine application and verified that I am speaking with the correct person using two identifiers.  Patient Location: Home  Provider Location: Home Office  I discussed the limitations of evaluation and management by telemedicine. The patient expressed understanding and agreed to proceed.  Patient Medicare AWV questionnaire was completed by the patient on 07/31/2022; I have confirmed that all information answered by patient is correct and no changes since this date.  Review of Systems    Nutrition Risk Assessment:  Has the patient had any N/V/D within the last 2 months?  No  Does the patient have any non-healing wounds?  No  Has the patient had any unintentional weight loss or weight gain?  No   Diabetes:  Is the patient diabetic?  Yes  If diabetic, was a CBG obtained today?  No  Did the patient bring in their glucometer from home?  No  How often do you monitor your CBG's? 3 x week .   Financial Strains and Diabetes Management:  Are you having any financial strains with the device, your supplies or your medication? No .  Does the patient want to be seen by Chronic Care Management for management of their diabetes?  No  Would the patient like to be referred to a Nutritionist or for Diabetic Management?  No   Diabetic Exams:  Diabetic Eye Exam: Overdue for diabetic eye exam. Pt has been advised about the importance in completing this exam. Patient advised to call and schedule an eye exam. Diabetic Foot Exam: Overdue, Pt has been advised about the importance in completing this exam. Pt is scheduled for diabetic foot exam on next office visit .  Cardiac Risk Factors include: advanced age (>63men, >78 women);diabetes mellitus;dyslipidemia;male gender;hypertension      Objective:    Today's Vitals   07/31/22 1128  Weight: 175 lb (79.4 kg)  Height: 5\' 7"  (1.702 m)   Body mass index is 27.41 kg/m.     07/31/2022   11:33 AM 07/26/2021   11:26 AM 07/21/2020    4:11 PM 07/21/2020   12:08 AM 05/28/2020    2:07 PM 04/05/2019   11:08 AM 06/26/2013   10:58 AM  Advanced Directives  Does Patient Have a Medical Advance Directive? No No  No Yes No Patient does not have advance directive;Patient would not like information  Type of Information systems manager Power of [place];Living will    Copy of Healthcare Power of Attorney in Chart?     No - copy requested    Would patient like information on creating a medical advance directive? No - Patient declined Yes (MAU/Ambulatory/Procedural Areas - Information given) No - Patient declined   No - Guardian declined   Pre-existing out of facility DNR order (yellow form or pink MOST form)       No    Current Medications (verified) Outpatient Encounter Medications as of 07/31/2022  Medication Sig   Alogliptin Benzoate 25 MG TABS Take 12.5 mg by mouth daily.   aspirin EC 81 MG tablet Take 81 mg by mouth daily. Evening 1600   carvedilol (COREG) 12.5 MG tablet Take 6.25 mg by mouth 2 (two) times daily with a meal.   clopidogrel (PLAVIX) 75 MG tablet Take 1 tablet (75 mg total) by mouth  daily.   Empagliflozin-Linaglip-Metform 12.5-2.05-998 MG TB24 Take by mouth.   levothyroxine (SYNTHROID, LEVOTHROID) 88 MCG tablet Take 88 mcg by mouth daily before breakfast.   nitroGLYCERIN (NITROSTAT) 0.4 MG SL tablet Place under the tongue.   Omega-3 Fatty Acids (FISH OIL PO) Take 1,000 mg by mouth 2 (two) times daily.   pantoprazole (PROTONIX) 40 MG tablet Take 1 tablet (40 mg total) by mouth daily.   sacubitril-valsartan (ENTRESTO) 49-51 MG Take 1 tablet by mouth 2 (two) times daily.   atorvastatin (LIPITOR) 80 MG tablet Take 1 tablet (80 mg total) by mouth daily at 6 PM.   furosemide (LASIX) 40 MG tablet Take 1 tablet (40 mg  total) by mouth daily as needed (For weight gain > 3 lbs overnight.). (Patient not taking: Reported on 07/26/2021)   [DISCONTINUED] enoxaparin (LOVENOX) 80 MG/0.8ML injection Inject 0.8 mLs (80 mg total) into the skin every 12 (twelve) hours.   No facility-administered encounter medications on file as of 07/31/2022.    Allergies (verified) Patient has no known allergies.   History: Past Medical History:  Diagnosis Date   Coronary artery disease    Diabetes mellitus without complication (HCC)    GERD (gastroesophageal reflux disease)    Glaucoma    Hypercholesteremia    Hypertension    Lesion of lung    [facility]   Myocardial infarction (HCC)    x3   Postablative hypothyroidism 03/18/2020   Sleep apnea    unable to tolerate CPAP   Past Surgical History:  Procedure Laterality Date   BOWEL RESECTION     CARDIAC CATHETERIZATION     2 stents in heart   CATARACT EXTRACTION W/PHACO Left 07/01/2013   Procedure: CATARACT EXTRACTION PHACO AND INTRAOCULAR LENS PLACEMENT (IOC);  Surgeon: Loraine Leriche T. Nile Riggs, MD;  Location: [facility];  Service: Ophthalmology;  Laterality: Left;  CDE 6.84   CORONARY STENT INTERVENTION N/A 04/07/2019   Procedure: CORONARY STENT INTERVENTION;  Surgeon: Lennette Bihari, MD;  Location: [facility]; mRCA 80% -> STENT RESOLUTE ONYX 3.5X22 (3.6 mm)   CORONARY/GRAFT ACUTE MI REVASCULARIZATION N/A 07/20/2020   Procedure: Coronary/Graft Acute MI Revascularization - Coronary Stent Intervention;  Surgeon: Lyn Records, MD;  Location: [facility];  Service: Cardiovascular;; proximal RCA 100% occlusion (proximal to previous stent)-3.5 mm 18 mm Onyx Frontier DES (3.75 mm)-overlaps previous stent.   HAND SURGERY Left    LEFT HEART CATH AND CORONARY ANGIOGRAPHY N/A 04/07/2019   Procedure: LEFT HEART CATH AND CORONARY ANGIOGRAPHY;  Surgeon: Lennette Bihari, MD;  Location: [facility];  Service: Cardiovascular;  Laterality: N/A;   LEFT  HEART CATH AND CORONARY ANGIOGRAPHY N/A 07/20/2020   Procedure: LEFT HEART CATH AND CORONARY ANGIOGRAPHY;  Surgeon: Lyn Records, MD;;  Cultprit Lesion: 100% Thrombotic proximal RCA occlusion, mid RCA stent.  (DES PCI-3.5 mm x18 mm -3.75 mm, overlapping mid RCA stent). tandem 70% & 50% PDA lesions, 50% PL 1.  Collaterals from distal RCA-> LAD. Patent stent in the RI.  20% ostial & imd LAD -> mid LAD severe diffuse disease followed by CTO.   NASAL SEPTUM SURGERY     SHOULDER ARTHROSCOPY Right    TRANSTHORACIC ECHOCARDIOGRAM  07/21/2020   (Post inferior STEMI) moderately reduced LVEF 30 to 35%.  Apical aneurysm with inferior akinesis and inferolateral/inferoseptal/severe HK.  GR 1 DD.  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  Normal RV, unable to assess pressures.  Mild aortic sclerosis but no stenosis.  Elevated RAP/CVP of estimated 15 mmHg with dilated IVC.   Family History  Problem Relation Age of Onset   Stroke Mother    Heart disease Father    Social History   Socioeconomic History   Marital status: Married    Spouse name: [name]   Number of children: 1   Years of education: Not on file   Highest education level: Not on file  Occupational History   Occupation: retired    Comment: [employer]  Tobacco Use   Smoking status: Former    Current packs/day: 0.00    Average packs/day: 2.0 packs/day for 40.0 years (80.0 ttl pk-yrs)    Types: Cigarettes    Start date: 01/10/1968    Quit date: 01/10/2008    Years since quitting: 14.5   Smokeless tobacco: Never  Vaping Use   Vaping status: Never Used  Substance and Sexual Activity   Alcohol use: Yes    Comment: occasional glass of wine   Drug use: No   Sexual activity: Not Currently  Other Topics Concern   Not on file  Social History Narrative   Lives with his wife (who has dementia) - first floor of apartment complex   Social Determinants of Health   Financial Resource Strain:  Low Risk  (07/31/2022)   Overall Financial Resource Strain (CARDIA)    Difficulty of Paying Living Expenses: Not hard at all  Food Insecurity: No Food Insecurity (07/31/2022)   Hunger Vital Sign    Worried About Running Out of Food in the Last Year: Never true    Ran Out of Food in the Last Year: Never true  Transportation Needs: No Transportation Needs (07/31/2022)   PRAPARE - Administrator, Civil Service (Medical): No    Lack of Transportation (Non-Medical): No  Physical Activity: Insufficiently Active (07/31/2022)   Exercise Vital Sign    Days of Exercise per Week: 3 days    Minutes of Exercise per Session: 30 min  Stress: No Stress Concern Present (07/31/2022)   [employer] of Occupational Health - Occupational Stress Questionnaire    Feeling of Stress : Not at all  Social Connections: Moderately Isolated (07/31/2022)   Social Connection and Isolation Panel [NHANES]    Frequency of Communication with Friends and Family: More than three times a week    Frequency of Social Gatherings with Friends and Family: More than three times a week    Attends Religious Services: Never    Database administrator or Organizations: No    Attends Engineer, structural: Never    Marital Status: Married    Tobacco Counseling Counseling given: Not Answered   Clinical Intake:  Pre-visit preparation completed: Yes  Pain : No/denies pain     Nutritional Risks: None Diabetes: Yes CBG done?: No Did pt. bring in CBG monitor from home?: No  How often do you need to have someone help you when you read instructions, pamphlets, or other written materials from your doctor or pharmacy?: 1 - Never  Interpreter Needed?: No  Information entered by :: Renie Ora, LPN   Activities of Daily Living    07/31/2022   11:33 AM  In your present state of health, do you have any difficulty performing the following activities:  Hearing? 0  Vision? 0  Difficulty concentrating or  making decisions? 0  Walking or climbing stairs? 0  Dressing or bathing? 0  Doing errands, shopping? 0  Preparing Food and eating ? N  Using the Toilet? N  In the past six months, have you accidently leaked urine? N  Do you have problems with loss of bowel control? N  Managing your Medications? N  Managing your Finances? N  Housekeeping or managing your Housekeeping? N    Patient Care Team: Milian, Aleen Campi, FNP as PCP - General (Family Medicine) Lennette Bihari, MD as PCP - Cardiology (Cardiology) Azalee Course, [place] as Physician Assistant (Cardiology)  Indicate any recent Medical Services you may have received from other than [facility] providers in the past year (date may be approximate).     Assessment:   This is a routine wellness examination for Merrik.  Hearing/Vision screen Vision Screening - Comments:: [facility] with [facility]   Dietary issues and exercise activities discussed:     Goals Addressed             This Visit's Progress    DIET - INCREASE WATER INTAKE         Depression Screen    07/31/2022   11:32 AM 06/01/2022    9:58 AM 06/01/2022    9:50 AM 05/04/2022    9:57 AM 07/26/2021   11:25 AM 05/28/2020    2:07 PM 03/18/2020   10:06 AM  PHQ 2/9 Scores  PHQ - 2 Score 0 2 0 0 2 0 0  PHQ- 9 Score 0 5  3 4       Fall Risk    07/31/2022   11:31 AM 06/01/2022    9:50 AM 05/04/2022    9:59 AM 07/26/2021   11:21 AM 05/28/2020    2:07 PM  Fall Risk   Falls in the past year? 0 0 0 1 1  Number falls in past yr: 0  0 1 0  Injury with Fall? 0  0 0 0  Risk for fall due to : No Fall Risks  No Fall Risks Orthopedic patient;Impaired balance/gait Orthopedic patient;Impaired vision;History of fall(s)  Follow up Falls prevention discussed  Falls evaluation completed Falls prevention discussed;Education provided Education provided;Falls prevention discussed    MEDICARE RISK AT HOME:  Medicare Risk at Home - 07/31/22 1131     Any stairs in or around the home? No    If  so, are there any without handrails? No    Home free of loose throw rugs in walkways, pet beds, electrical cords, etc? Yes    Adequate lighting in your home to reduce risk of falls? Yes    Life alert? No    Use of a cane, walker or w/c? No    Grab bars in the bathroom? Yes    Shower chair or bench in shower? Yes    Elevated toilet seat or a handicapped toilet? Yes             TIMED UP AND GO:  Was the test performed?  No    Cognitive Function:        07/31/2022   11:34 AM 07/26/2021   11:28 AM 05/28/2020    1:56 PM  6CIT Screen  What Year? 0 points 0 points 0 points  What month? 0 points 0 points 0 points  What time? 0 points 0 points 0 points  Count back from 20 0 points 0 points 0 points  Months in reverse 0 points 0 points 0 points  Repeat phrase 0 points 2 points 0 points  Total Score 0 points 2 points 0  points    Immunizations Immunization History  Administered Date(s) Administered   Fluad Quad(high Dose 65+) 09/30/2018   Influenza-Unspecified 10/20/2020   Moderna Sars-Covid-2 Vaccination 03/06/2019, 03/31/2019, 08/28/2019, 04/09/2020, 10/07/2020   Pneumococcal Conjugate-13 03/18/2020   Pneumococcal Polysaccharide-23 12/01/2009   Tdap 04/20/2021   Zoster Recombinant(Shingrix) 09/09/2020, 12/06/2020    TDAP status: Up to date  Flu Vaccine status: Up to date  Pneumococcal vaccine status: Up to date  Covid-19 vaccine status: Completed vaccines  Qualifies for Shingles Vaccine? Yes   Zostavax completed Yes   Shingrix Completed?: Yes  Screening Tests Health Maintenance  Topic Date Due   OPHTHALMOLOGY EXAM  05/09/2019   COVID-19 Vaccine (6 - 2023-24 season) 09/09/2021   Lung Cancer Screening  05/04/2023 (Originally 12/19/1998)   Hepatitis C Screening  05/04/2023 (Originally 12/19/1968)   INFLUENZA VACCINE  08/10/2022   HEMOGLOBIN A1C  11/03/2022   Diabetic kidney evaluation - eGFR measurement  05/04/2023   Diabetic kidney evaluation - Urine ACR   05/04/2023   FOOT EXAM  05/04/2023   Medicare Annual Wellness (AWV)  07/31/2023   Pneumonia Vaccine 102+ Years old (3 of 3 - PPSV23 or PCV20) 03/18/2025   Colonoscopy  03/19/2028   DTaP/Tdap/Td (2 - Td or Tdap) 04/21/2031   Zoster Vaccines- Shingrix  Completed   HPV VACCINES  Aged Out    Health Maintenance  Health Maintenance Due  Topic Date Due   OPHTHALMOLOGY EXAM  05/09/2019   COVID-19 Vaccine (6 - 2023-24 season) 09/09/2021    Colorectal cancer screening: Referral to GI placed declined will discuss  with Pcp . Pt aware the office will call re: appt.  Lung Cancer Screening: (Low Dose CT Chest recommended if Age 10-80 years, 20 pack-year currently smoking OR have quit w/in 15years.) does qualify.   Lung Cancer Screening Referral: declined at this time   Additional Screening:  Hepatitis C Screening: does qualify;  Vision Screening: Recommended annual ophthalmology exams for early detection of glaucoma and other disorders of the eye. Is the patient up to date with their annual eye exam?  No  Who is the provider or what is the name of the office in which the patient attends annual eye exams? [facility]  If pt is not established with a provider, would they like to be referred to a provider to establish care? No .   Dental Screening: Recommended annual dental exams for proper oral hygiene    Community Resource Referral / Chronic Care Management: CRR required this visit?  No   CCM required this visit?  No     Plan:     I have personally reviewed and noted the following in the patient's chart:   Medical and social history Use of alcohol, tobacco or illicit drugs  Current medications and supplements including opioid prescriptions. Patient is not currently taking opioid prescriptions. Functional ability and status Nutritional status Physical activity Advanced directives List of other physicians Hospitalizations, surgeries, and ER visits in previous 12  months Vitals Screenings to include cognitive, depression, and falls Referrals and appointments  In addition, I have reviewed and discussed with patient certain preventive protocols, quality metrics, and best practice recommendations. A written personalized care plan for preventive services as well as general preventive health recommendations were provided to patient.     Lorrene Reid, LPN   1/61/0960   After Visit Summary: (MyChart) Due to this being a telephonic visit, the after visit summary with patients personalized plan was offered to patient via MyChart  Nurse Notes: none

## 2022-08-02 ENCOUNTER — Ambulatory Visit (INDEPENDENT_AMBULATORY_CARE_PROVIDER_SITE_OTHER): Payer: No Typology Code available for payment source | Admitting: Gastroenterology

## 2022-08-02 ENCOUNTER — Encounter: Payer: Self-pay | Admitting: Gastroenterology

## 2022-08-02 VITALS — BP 131/77 | HR 55 | Temp 97.9°F | Ht 67.5 in | Wt 178.4 lb

## 2022-08-02 DIAGNOSIS — D509 Iron deficiency anemia, unspecified: Secondary | ICD-10-CM | POA: Diagnosis not present

## 2022-08-02 DIAGNOSIS — K2270 Barrett's esophagus without dysplasia: Secondary | ICD-10-CM

## 2022-08-02 NOTE — Patient Instructions (Signed)
We will arrange tree to have an upper endoscopy in the near future with Dr. Jena Gauss. Do not take any diabetes medications the morning of your procedure.  Please have blood work completed at [facility].  We will call you with results.  Continue taking iron daily.  We will follow-up with you in the office after your upper endoscopy.  Do not hesitate to call if you have any questions or concerns prior to your next visit.  It was very nice to meet you today!  Ermalinda Memos, PA-C [facility]

## 2022-08-03 ENCOUNTER — Encounter: Payer: Self-pay | Admitting: Gastroenterology

## 2022-08-04 LAB — CBC WITH DIFFERENTIAL/PLATELET: MCH: 27.8 pg (ref 26.6–33.0)

## 2022-08-18 ENCOUNTER — Telehealth: Payer: Self-pay | Admitting: *Deleted

## 2022-08-18 NOTE — Telephone Encounter (Signed)
CALLED PT to schedule EGD with Dr. Jena Gauss ASA 3/4. He stated he does not have a driver or anyone that can take him for this procedure. His spouse has dementia and does not drive. He wanted to know if he could wait around in recovery and then drive home. I advised they would not allow this he would need a driver. I advised once he found someone that can help him out then to let us know and we can schedule him for this procedure. He reports he had no friends or family that can but he will see.  FYI to [employer]

## 2022-08-23 ENCOUNTER — Other Ambulatory Visit: Payer: Self-pay | Admitting: *Deleted

## 2022-08-23 ENCOUNTER — Other Ambulatory Visit: Payer: Self-pay | Admitting: Gastroenterology

## 2022-08-23 DIAGNOSIS — D509 Iron deficiency anemia, unspecified: Secondary | ICD-10-CM

## 2022-08-23 DIAGNOSIS — K2270 Barrett's esophagus without dysplasia: Secondary | ICD-10-CM

## 2022-08-23 NOTE — Progress Notes (Signed)
error 

## 2022-08-23 NOTE — Telephone Encounter (Signed)
Noted  

## 2022-08-27 ENCOUNTER — Other Ambulatory Visit: Payer: Self-pay | Admitting: Gastroenterology

## 2022-08-27 DIAGNOSIS — E538 Deficiency of other specified B group vitamins: Secondary | ICD-10-CM

## 2022-08-27 MED ORDER — CYANOCOBALAMIN 1000 MCG/ML IJ SOLN: 1000.0000 ug | INTRAMUSCULAR | 0 refills | Status: DC

## 2022-09-05 ENCOUNTER — Encounter: Payer: Self-pay | Admitting: Family Medicine

## 2022-09-05 ENCOUNTER — Ambulatory Visit (INDEPENDENT_AMBULATORY_CARE_PROVIDER_SITE_OTHER): Payer: Medicare HMO | Admitting: Family Medicine

## 2022-09-05 VITALS — BP 117/71 | HR 60 | Temp 98.3°F | Ht 67.5 in | Wt 176.0 lb

## 2022-09-05 DIAGNOSIS — R234 Changes in skin texture: Secondary | ICD-10-CM

## 2022-09-05 DIAGNOSIS — E1159 Type 2 diabetes mellitus with other circulatory complications: Secondary | ICD-10-CM | POA: Diagnosis not present

## 2022-09-05 DIAGNOSIS — Z1331 Encounter for screening for depression: Secondary | ICD-10-CM

## 2022-09-05 DIAGNOSIS — I152 Hypertension secondary to endocrine disorders: Secondary | ICD-10-CM

## 2022-09-05 DIAGNOSIS — L84 Corns and callosities: Secondary | ICD-10-CM | POA: Diagnosis not present

## 2022-09-05 DIAGNOSIS — E118 Type 2 diabetes mellitus with unspecified complications: Secondary | ICD-10-CM

## 2022-09-05 DIAGNOSIS — I25110 Atherosclerotic heart disease of native coronary artery with unstable angina pectoris: Secondary | ICD-10-CM | POA: Diagnosis not present

## 2022-09-05 DIAGNOSIS — D509 Iron deficiency anemia, unspecified: Secondary | ICD-10-CM

## 2022-09-05 LAB — BAYER DCA HB A1C WAIVED: HB A1C (BAYER DCA - WAIVED): 7.7 % — ABNORMAL HIGH (ref 4.8–5.6)

## 2022-09-05 NOTE — Progress Notes (Signed)
Acute Office Visit  Subjective:  Patient ID: Gregory Cole, male    DOB: January 03, 1951, 72 y.o.   MRN: [id number]  Chief Complaint  Patient presents with   Medical Management of Chronic Issues   HPI Patient is in today for management of chronic conditions. He is a patient of the [place], but is having trouble receiving care. [facility] and [facility]. He also states that he completes eye exams there.  Anemia  Reports that he was told that he was losing blood and they did not know where. Denies blood in stool and urine. States that he was having a lot emesis previously due to GERD, but that has improved. He is established with GI and renal. He is supposed to complete an EGD, but is having issues with transportation. Established with [facility]. Reports that [facility] Phoenix would like for him to complete iron infusions. Reports that this is not being completed at the [place] and his willing to go elsewhere for care. Endorses symptoms of fatigue   B12 deficiency  Reports history of B12 deficiency and that he has been taking OTC Vitamin B12. Reports that he was told he needed B12 injections per GI. States that he has not received injections of B12, he is taking 1000 mcg daily.   History of CAD with unstable angina  Follows with the [place].  States that he has had to take nitroglycerin a couple of times over the past few months. Wonders if the heat causes his symptoms. Resolved with one nitroglycerin   Anxiety/Stress Reports that he is under a lot of stress recently with family stress and managing his health.  States that his sister recently passed  Is primary caregiver for his wife who has debilitating disease.  Endorses thoughts of harming himself  States that is passing thoughts. Denies active plans. Has protective factor in caring for wife.  Does not wish to start medication at this time or counseling.   Type 2 Diabetes with CAD Glucometer: Has a  monitor. Does not use it.  States that he is being treated at the [place], but they have not checked his "blood levels" since the beginning of the year.   High at home: unknown; Low at home: unknown, Taking medication(s): empagliflozin-linaglip-Metformin combination ,.  [facility]  Last foot exam: 05/04/22 Last A1c:  Lab Results  Component Value Date   HGBA1C 7.6 (H) 05/04/2022   Nephropathy screen indicated?: completed  Last flu, zoster and/or pneumovax:  Immunization History  Administered Date(s) Administered   Fluad Quad(high Dose 65+) 09/30/2018   Influenza-Unspecified 10/20/2020   Moderna Sars-Covid-2 Vaccination 03/06/2019, 03/31/2019, 08/28/2019, 04/09/2020, 10/07/2020   Pneumococcal Conjugate-13 03/18/2020   Pneumococcal Polysaccharide-23 12/01/2009   Tdap 04/20/2021   Zoster Recombinant(Shingrix) 09/09/2020, 12/06/2020   ROS: Denies dizziness, LOC, polyuria, polydipsia, unintended weight loss/gain numbness or tingling in extremities, shortness of breath or chest pain. Endorses foot ulcerations   Of note, patient reports it is difficult to get to many appt because he needs a driver and someone to stay with his wife.   ROS As per HPI   Objective:  BP 117/71   Pulse 60   Temp 98.3 F (36.8 C)   Ht 5' 7.5" (1.715 m)   Wt 176 lb (79.8 kg)   SpO2 95%   BMI 27.16 kg/m    Physical Exam Constitutional:      General: He is awake. He is  not in acute distress.    Appearance: Normal appearance. He is well-developed and well-groomed. He is not ill-appearing, toxic-appearing or diaphoretic.  Cardiovascular:     Rate and Rhythm: Regular rhythm. Bradycardia present.     Pulses: Normal pulses.          Radial pulses are 2+ on the right side and 2+ on the left side.       Posterior tibial pulses are 2+ on the right side and 2+ on the left side.     Heart sounds: Normal heart sounds. No murmur heard.    No gallop.  Pulmonary:     Effort: Pulmonary effort is  normal. No respiratory distress.     Breath sounds: Normal breath sounds. No stridor. No wheezing, rhonchi or rales.  Musculoskeletal:     Cervical back: Full passive range of motion without pain and neck supple.     Right lower leg: No edema.     Left lower leg: No edema.       Feet:  Feet:     Comments: Open callus on great toe of left foot.  Dry, flaky, cracked fissure on heel of left foot.  Skin:    General: Skin is warm.     Capillary Refill: Capillary refill takes less than 2 seconds.  Neurological:     General: No focal deficit present.     Mental Status: He is alert, oriented to person, place, and time and easily aroused. Mental status is at baseline.     GCS: GCS eye subscore is 4. GCS verbal subscore is 5. GCS motor subscore is 6.     Motor: No weakness.  Psychiatric:        Attention and Perception: Attention and perception normal.        Mood and Affect: Mood and affect normal.        Speech: Speech normal.        Behavior: Behavior normal. Behavior is cooperative.        Thought Content: Thought content normal. Thought content does not include homicidal or suicidal ideation. Thought content does not include homicidal or suicidal plan.        Cognition and Memory: Cognition and memory normal.        Judgment: Judgment normal.         Assessment & Plan:  1. Type 2 diabetes mellitus with complication, without long-term current use of insulin [facility]) Patient is receiving medication management through [place]. However, labs have not been completed per patient. Labs as below. Will communicate results to patient once available. Will await results to determine next steps.  Referral placed as below to podiatry for evaluation of foot wounds.  Per 08/03/22 labs, B12 within normal range. Will coordinate with GI to determine need for B12 injections.  - Ambulatory referral to Podiatry - Bayer DCA Hb A1c Waived; Future - Bayer DCA Hb A1c Waived  2. Coronary artery disease involving  native coronary artery of native heart with unstable angina pectoris [facility]) Patient to continue current medications and to follow up with Cardiology.   3. Fissure in skin of foot Referral placed as below.  - Ambulatory referral to Podiatry  4. Callus of foot Referral as below.  - Ambulatory referral to Podiatry  5. Encounter for screening for depression Pt screened positive for depression today. Pt offered nonpharmacologic and pharmacologic therapy. Pt declined initiating treatment at this time. Safety contract established today with patient in clinic. Denies intent to harm herself or  others. Pt to notify provider if they would like to initiate treatment.   6. Iron deficiency anemia, unspecified iron deficiency anemia type Will coordinate with Dr. Wolfgang Phoenix for iron infusions.   7. Hypertension associated with diabetes (HCC) Well controlled. Continue current regimen. Continue to follow at [facility].   The above assessment and management plan was discussed with the patient. The patient verbalized understanding of and has agreed to the management plan using shared-decision making. Patient is aware to call the clinic if they develop any new symptoms or if symptoms fail to improve or worsen. Patient is aware when to return to the clinic for a follow-up visit. Patient educated on when it is appropriate to go to the emergency department.   Return in about 3 months (around 12/06/2022) for Chronic Condition Follow up.  Neale Burly, DNP-FNP [facility] [address] [place], [place] [address] [phone]

## 2022-09-05 NOTE — Patient Instructions (Addendum)
Follow up with Gatroenterology, Ermalinda Memos  Follow up with Wolfgang Phoenix - kidney specialist  Follow up with me, Jerrel Ivory, in 3 months. Please follow up sooner if you need.  I will coordinate with Wolfgang Phoenix and Clearance Coots about injections.   [facility] Categories [employer]  [address] [place] [address] [phone] Send Email Visit Website

## 2022-09-06 NOTE — Progress Notes (Signed)
A1C remains above goal. Would like to see if patient is interested in GLP11 inhibitor. Does he have a history of pancreatitis or medullary thyroid cancer? If not, we can start ozempic.

## 2022-09-07 MED ORDER — SEMAGLUTIDE (1 MG/DOSE) 4 MG/3ML ~~LOC~~ SOPN: 1.0000 mg | PEN_INJECTOR | SUBCUTANEOUS | 0 refills | Status: DC

## 2022-09-07 MED ORDER — SYNJARDY XR 12.5-1000 MG PO TB24
1.0000 | ORAL_TABLET | Freq: Every day | ORAL | 5 refills | Status: DC
Start: 2022-09-07 — End: 2023-06-23

## 2022-09-07 MED ORDER — SEMAGLUTIDE(0.25 OR 0.5MG/DOS) 2 MG/3ML ~~LOC~~ SOPN: 0.2500 mg | PEN_INJECTOR | SUBCUTANEOUS | 0 refills | Status: DC

## 2022-09-07 MED ORDER — SEMAGLUTIDE(0.25 OR 0.5MG/DOS) 2 MG/3ML ~~LOC~~ SOPN
0.5000 mg | PEN_INJECTOR | SUBCUTANEOUS | 0 refills | Status: DC
Start: 2022-10-07 — End: 2023-06-19

## 2022-09-07 NOTE — Addendum Note (Signed)
Addended by: Neale Burly on: 09/07/2022 02:10 PM   Modules accepted: Orders

## 2022-09-19 NOTE — Progress Notes (Signed)
Please have patient set up appts with Triage RN for intramuscular B12 1000 mcg weekly for 4 weeks, then monthly, per GI recommendations. He will need to recheck B12 in 4-6 weeks, please schedule lab appt.

## 2022-09-22 DIAGNOSIS — D509 Iron deficiency anemia, unspecified: Secondary | ICD-10-CM | POA: Diagnosis not present

## 2022-09-22 DIAGNOSIS — I129 Hypertensive chronic kidney disease with stage 1 through stage 4 chronic kidney disease, or unspecified chronic kidney disease: Secondary | ICD-10-CM | POA: Diagnosis not present

## 2022-09-22 DIAGNOSIS — I5042 Chronic combined systolic (congestive) and diastolic (congestive) heart failure: Secondary | ICD-10-CM | POA: Diagnosis not present

## 2022-09-22 DIAGNOSIS — N189 Chronic kidney disease, unspecified: Secondary | ICD-10-CM | POA: Diagnosis not present

## 2022-09-22 DIAGNOSIS — R768 Other specified abnormal immunological findings in serum: Secondary | ICD-10-CM | POA: Diagnosis not present

## 2022-09-22 DIAGNOSIS — E1122 Type 2 diabetes mellitus with diabetic chronic kidney disease: Secondary | ICD-10-CM | POA: Diagnosis not present

## 2022-09-22 DIAGNOSIS — N1831 Chronic kidney disease, stage 3a: Secondary | ICD-10-CM | POA: Diagnosis not present

## 2022-09-22 DIAGNOSIS — D649 Anemia, unspecified: Secondary | ICD-10-CM | POA: Diagnosis not present

## 2022-09-25 ENCOUNTER — Other Ambulatory Visit: Payer: Self-pay | Admitting: *Deleted

## 2022-09-25 DIAGNOSIS — D509 Iron deficiency anemia, unspecified: Secondary | ICD-10-CM

## 2022-09-25 DIAGNOSIS — K2270 Barrett's esophagus without dysplasia: Secondary | ICD-10-CM

## 2022-09-26 DIAGNOSIS — I129 Hypertensive chronic kidney disease with stage 1 through stage 4 chronic kidney disease, or unspecified chronic kidney disease: Secondary | ICD-10-CM | POA: Diagnosis not present

## 2022-09-26 DIAGNOSIS — E1122 Type 2 diabetes mellitus with diabetic chronic kidney disease: Secondary | ICD-10-CM | POA: Diagnosis not present

## 2022-09-26 DIAGNOSIS — I5042 Chronic combined systolic (congestive) and diastolic (congestive) heart failure: Secondary | ICD-10-CM | POA: Diagnosis not present

## 2022-09-26 DIAGNOSIS — N1831 Chronic kidney disease, stage 3a: Secondary | ICD-10-CM | POA: Diagnosis not present

## 2022-10-03 ENCOUNTER — Other Ambulatory Visit: Payer: Self-pay | Admitting: Gastroenterology

## 2022-10-03 DIAGNOSIS — E538 Deficiency of other specified B group vitamins: Secondary | ICD-10-CM

## 2022-10-05 ENCOUNTER — Encounter: Payer: Self-pay | Admitting: *Deleted

## 2022-10-05 NOTE — Telephone Encounter (Signed)
Patient needs to have B12 rechecked so that we can determine if he needs to continue weekly injections versus monthly injections.  Please let him know to have B12 completed as soon as possible.  Refills can be sent after that.

## 2022-10-05 NOTE — Telephone Encounter (Signed)
Tried to reach pt several times. Number on file is not a working number and wife's number does not have a voicemail set up. Will mail a letter and lab requisitions.

## 2022-10-09 NOTE — Telephone Encounter (Signed)
Noted  

## 2022-11-10 ENCOUNTER — Encounter: Payer: Self-pay | Admitting: *Deleted

## 2022-12-06 ENCOUNTER — Ambulatory Visit: Payer: Medicare HMO | Admitting: Family Medicine

## 2022-12-12 ENCOUNTER — Telehealth: Payer: Self-pay | Admitting: Family Medicine

## 2022-12-12 NOTE — Telephone Encounter (Signed)
[facility] and [facility] not stay in [place] anymore

## 2023-06-19 ENCOUNTER — Inpatient Hospital Stay (HOSPITAL_COMMUNITY)
Admission: EM | Admit: 2023-06-19 | Discharge: 2023-06-23 | DRG: 322 | Disposition: A | Discharge status: Home / Self Care | Attending: Cardiology | Admitting: Cardiology

## 2023-06-19 ENCOUNTER — Emergency Department (HOSPITAL_COMMUNITY)

## 2023-06-19 ENCOUNTER — Encounter (HOSPITAL_COMMUNITY): Payer: Self-pay

## 2023-06-19 ENCOUNTER — Other Ambulatory Visit: Payer: Self-pay

## 2023-06-19 ENCOUNTER — Inpatient Hospital Stay (HOSPITAL_COMMUNITY)
Admission: EM | Disposition: A | Discharge status: Home / Self Care | Payer: Self-pay | Source: Home / Self Care | Attending: Cardiovascular Disease

## 2023-06-19 DIAGNOSIS — I5022 Chronic systolic (congestive) heart failure: Secondary | ICD-10-CM | POA: Diagnosis present

## 2023-06-19 DIAGNOSIS — E7800 Pure hypercholesterolemia, unspecified: Secondary | ICD-10-CM | POA: Diagnosis present

## 2023-06-19 DIAGNOSIS — E785 Hyperlipidemia, unspecified: Secondary | ICD-10-CM | POA: Diagnosis present

## 2023-06-19 DIAGNOSIS — Z7985 Long-term (current) use of injectable non-insulin antidiabetic drugs: Secondary | ICD-10-CM

## 2023-06-19 DIAGNOSIS — F0280 Dementia in other diseases classified elsewhere without behavioral disturbance: Secondary | ICD-10-CM | POA: Diagnosis present

## 2023-06-19 DIAGNOSIS — Z7982 Long term (current) use of aspirin: Secondary | ICD-10-CM | POA: Diagnosis not present

## 2023-06-19 DIAGNOSIS — I255 Ischemic cardiomyopathy: Secondary | ICD-10-CM | POA: Diagnosis present

## 2023-06-19 DIAGNOSIS — N179 Acute kidney failure, unspecified: Secondary | ICD-10-CM | POA: Diagnosis present

## 2023-06-19 DIAGNOSIS — G309 Alzheimer's disease, unspecified: Secondary | ICD-10-CM | POA: Diagnosis present

## 2023-06-19 DIAGNOSIS — I213 ST elevation (STEMI) myocardial infarction of unspecified site: Secondary | ICD-10-CM | POA: Diagnosis present

## 2023-06-19 DIAGNOSIS — E1122 Type 2 diabetes mellitus with diabetic chronic kidney disease: Secondary | ICD-10-CM | POA: Diagnosis present

## 2023-06-19 DIAGNOSIS — Z955 Presence of coronary angioplasty implant and graft: Secondary | ICD-10-CM

## 2023-06-19 DIAGNOSIS — I130 Hypertensive heart and chronic kidney disease with heart failure and stage 1 through stage 4 chronic kidney disease, or unspecified chronic kidney disease: Secondary | ICD-10-CM | POA: Diagnosis present

## 2023-06-19 DIAGNOSIS — Z8249 Family history of ischemic heart disease and other diseases of the circulatory system: Secondary | ICD-10-CM

## 2023-06-19 DIAGNOSIS — I2510 Atherosclerotic heart disease of native coronary artery without angina pectoris: Secondary | ICD-10-CM

## 2023-06-19 DIAGNOSIS — N1831 Chronic kidney disease, stage 3a: Secondary | ICD-10-CM | POA: Diagnosis present

## 2023-06-19 DIAGNOSIS — Z79899 Other long term (current) drug therapy: Secondary | ICD-10-CM | POA: Diagnosis not present

## 2023-06-19 DIAGNOSIS — E890 Postprocedural hypothyroidism: Secondary | ICD-10-CM | POA: Diagnosis present

## 2023-06-19 DIAGNOSIS — Z823 Family history of stroke: Secondary | ICD-10-CM | POA: Diagnosis not present

## 2023-06-19 DIAGNOSIS — Z87891 Personal history of nicotine dependence: Secondary | ICD-10-CM | POA: Diagnosis not present

## 2023-06-19 DIAGNOSIS — R079 Chest pain, unspecified: Secondary | ICD-10-CM | POA: Diagnosis not present

## 2023-06-19 DIAGNOSIS — I2111 ST elevation (STEMI) myocardial infarction involving right coronary artery: Principal | ICD-10-CM

## 2023-06-19 DIAGNOSIS — Z7902 Long term (current) use of antithrombotics/antiplatelets: Secondary | ICD-10-CM

## 2023-06-19 DIAGNOSIS — I2119 ST elevation (STEMI) myocardial infarction involving other coronary artery of inferior wall: Secondary | ICD-10-CM | POA: Diagnosis present

## 2023-06-19 DIAGNOSIS — I351 Nonrheumatic aortic (valve) insufficiency: Secondary | ICD-10-CM | POA: Diagnosis not present

## 2023-06-19 DIAGNOSIS — I25110 Atherosclerotic heart disease of native coronary artery with unstable angina pectoris: Secondary | ICD-10-CM | POA: Diagnosis not present

## 2023-06-19 DIAGNOSIS — I10 Essential (primary) hypertension: Secondary | ICD-10-CM | POA: Diagnosis not present

## 2023-06-19 DIAGNOSIS — R001 Bradycardia, unspecified: Secondary | ICD-10-CM | POA: Diagnosis not present

## 2023-06-19 HISTORY — PX: LEFT HEART CATH AND CORONARY ANGIOGRAPHY: CATH118249

## 2023-06-19 LAB — CBC
HCT: 46.4 % (ref 39.0–52.0)
Hemoglobin: 15.2 g/dL (ref 13.0–17.0)
MCH: 31.6 pg (ref 26.0–34.0)
MCHC: 32.8 g/dL (ref 30.0–36.0)
MCV: 96.5 fL (ref 80.0–100.0)
Platelets: 189 10*3/uL (ref 150–400)
RBC: 4.81 MIL/uL (ref 4.22–5.81)
RDW: 13.3 % (ref 11.5–15.5)
WBC: 6.9 10*3/uL (ref 4.0–10.5)
nRBC: 0 % (ref 0.0–0.2)

## 2023-06-19 LAB — BASIC METABOLIC PANEL WITH GFR
Anion gap: 9 (ref 5–15)
BUN: 20 mg/dL (ref 8–23)
CO2: 22 mmol/L (ref 22–32)
Calcium: 8.8 mg/dL — ABNORMAL LOW (ref 8.9–10.3)
Chloride: 108 mmol/L (ref 98–111)
Creatinine, Ser: 1.18 mg/dL (ref 0.61–1.24)
GFR, Estimated: >60 mL/min (ref >60)
Glucose, Bld: 127 mg/dL — ABNORMAL HIGH (ref 70–99)
Potassium: 4.2 mmol/L (ref 3.5–5.1)
Sodium: 139 mmol/L (ref 135–145)

## 2023-06-19 LAB — GLUCOSE, CAPILLARY: Glucose-Capillary: 107 mg/dL — ABNORMAL HIGH (ref 70–99)

## 2023-06-19 LAB — TROPONIN I (HIGH SENSITIVITY)
Troponin I (High Sensitivity): 128 ng/L (ref <18)
Troponin I (High Sensitivity): 211 ng/L (ref <18)

## 2023-06-19 LAB — PROTIME-INR
INR: 1.1 (ref 0.8–1.2)
Prothrombin Time: 14.3 s (ref 11.4–15.2)

## 2023-06-19 LAB — APTT: aPTT: 37 s — ABNORMAL HIGH (ref 24–36)

## 2023-06-19 SURGERY — LEFT HEART CATH AND CORONARY ANGIOGRAPHY
Anesthesia: LOCAL

## 2023-06-19 MED ORDER — MIDAZOLAM HCL 2 MG/2ML IJ SOLN
INTRAMUSCULAR | Status: DC | PRN
Start: 2023-06-19 — End: 2023-06-19
  Administered 2023-06-19: 1 mg via INTRAVENOUS

## 2023-06-19 MED ORDER — SACUBITRIL-VALSARTAN 49-51 MG PO TABS: 1.0000 | ORAL_TABLET | Freq: Two times a day (BID) | ORAL | Status: DC

## 2023-06-19 MED ORDER — ATORVASTATIN CALCIUM 80 MG PO TABS
80.0000 mg | ORAL_TABLET | Freq: Every day | ORAL | Status: DC
  Administered 2023-06-20 – 2023-06-22 (×3): 80 mg via ORAL
  Filled 2023-06-19 (×3): qty 1

## 2023-06-19 MED ORDER — LABETALOL HCL 5 MG/ML IV SOLN: 10.0000 mg | INTRAVENOUS | Status: AC | PRN

## 2023-06-19 MED ORDER — VERAPAMIL HCL 2.5 MG/ML IV SOLN
INTRAVENOUS | Status: AC
  Filled 2023-06-19: qty 2

## 2023-06-19 MED ORDER — VERAPAMIL HCL 2.5 MG/ML IV SOLN
INTRAVENOUS | Status: DC | PRN
  Administered 2023-06-19: 10 mL via INTRA_ARTERIAL

## 2023-06-19 MED ORDER — LIDOCAINE HCL (PF) 1 % IJ SOLN
INTRAMUSCULAR | Status: DC | PRN
  Administered 2023-06-19: 2 mL

## 2023-06-19 MED ORDER — NITROGLYCERIN 1 MG/10 ML FOR IR/CATH LAB
INTRA_ARTERIAL | Status: AC
  Filled 2023-06-19: qty 10

## 2023-06-19 MED ORDER — NITROGLYCERIN IN D5W 200-5 MCG/ML-% IV SOLN
INTRAVENOUS | Status: AC | PRN
  Administered 2023-06-19: 10 ug/min via INTRAVENOUS

## 2023-06-19 MED ORDER — HYDRALAZINE HCL 20 MG/ML IJ SOLN: 10.0000 mg | INTRAMUSCULAR | Status: AC | PRN

## 2023-06-19 MED ORDER — ACETAMINOPHEN 325 MG PO TABS
650.0000 mg | ORAL_TABLET | ORAL | Status: DC | PRN
Start: 2023-06-19 — End: 2023-06-23

## 2023-06-19 MED ORDER — NITROGLYCERIN 0.4 MG SL SUBL: 0.4000 mg | SUBLINGUAL_TABLET | SUBLINGUAL | Status: DC | PRN

## 2023-06-19 MED ORDER — HEPARIN (PORCINE) 25000 UT/250ML-% IV SOLN
1250.0000 [IU]/h | INTRAVENOUS | Status: DC
  Administered 2023-06-19: 1100 [IU]/h via INTRAVENOUS
  Administered 2023-06-20: 1250 [IU]/h via INTRAVENOUS
  Filled 2023-06-19 (×2): qty 250

## 2023-06-19 MED ORDER — SODIUM CHLORIDE 0.9 % IV SOLN: INTRAVENOUS | Status: AC

## 2023-06-19 MED ORDER — SODIUM CHLORIDE 0.9% FLUSH: 3.0000 mL | INTRAVENOUS | Status: DC | PRN

## 2023-06-19 MED ORDER — IOHEXOL 350 MG/ML SOLN
INTRAVENOUS | Status: DC | PRN
Start: 2023-06-19 — End: 2023-06-19
  Administered 2023-06-19: 30 mL

## 2023-06-19 MED ORDER — MIDAZOLAM HCL 2 MG/2ML IJ SOLN
INTRAMUSCULAR | Status: AC
  Filled 2023-06-19: qty 2

## 2023-06-19 MED ORDER — NITROGLYCERIN IN D5W 200-5 MCG/ML-% IV SOLN
0.0000 ug/min | INTRAVENOUS | Status: DC
  Administered 2023-06-20: 65 ug/min via INTRAVENOUS
  Filled 2023-06-19: qty 250

## 2023-06-19 MED ORDER — ONDANSETRON HCL 4 MG/2ML IJ SOLN: 4.0000 mg | Freq: Four times a day (QID) | INTRAMUSCULAR | Status: DC | PRN

## 2023-06-19 MED ORDER — LEVOTHYROXINE SODIUM 88 MCG PO TABS
88.0000 ug | ORAL_TABLET | Freq: Every day | ORAL | Status: DC
  Administered 2023-06-20 – 2023-06-23 (×4): 88 ug via ORAL
  Filled 2023-06-19 (×4): qty 1

## 2023-06-19 MED ORDER — FENTANYL CITRATE (PF) 100 MCG/2ML IJ SOLN
INTRAMUSCULAR | Status: DC | PRN
Start: 2023-06-19 — End: 2023-06-19
  Administered 2023-06-19: 25 ug via INTRAVENOUS

## 2023-06-19 MED ORDER — SODIUM CHLORIDE 0.9% FLUSH
3.0000 mL | Freq: Two times a day (BID) | INTRAVENOUS | Status: DC
  Administered 2023-06-19 – 2023-06-23 (×6): 3 mL via INTRAVENOUS

## 2023-06-19 MED ORDER — FENTANYL CITRATE (PF) 100 MCG/2ML IJ SOLN
INTRAMUSCULAR | Status: AC
Start: 2023-06-19 — End: ?
  Filled 2023-06-19: qty 2

## 2023-06-19 MED ORDER — SODIUM CHLORIDE 0.9 % IV SOLN: 250.0000 mL | INTRAVENOUS | Status: AC | PRN

## 2023-06-19 MED ORDER — SODIUM CHLORIDE 0.9 % IV SOLN
INTRAVENOUS | Status: AC | PRN
  Administered 2023-06-19: 10 mL/h via INTRAVENOUS

## 2023-06-19 MED ORDER — LIDOCAINE HCL (PF) 1 % IJ SOLN
INTRAMUSCULAR | Status: AC
  Filled 2023-06-19: qty 30

## 2023-06-19 MED ORDER — HEPARIN (PORCINE) IN NACL 1000-0.9 UT/500ML-% IV SOLN
INTRAVENOUS | Status: DC | PRN
  Administered 2023-06-19 (×2): 500 mL

## 2023-06-19 MED ORDER — HEPARIN SODIUM (PORCINE) 5000 UNIT/ML IJ SOLN
4000.0000 [IU] | Freq: Once | INTRAMUSCULAR | Status: AC
  Administered 2023-06-19: 4000 [IU] via INTRAVENOUS

## 2023-06-19 MED ORDER — PANTOPRAZOLE SODIUM 40 MG PO TBEC
40.0000 mg | DELAYED_RELEASE_TABLET | Freq: Every day | ORAL | Status: DC
  Administered 2023-06-19 – 2023-06-23 (×5): 40 mg via ORAL
  Filled 2023-06-19 (×5): qty 1

## 2023-06-19 MED ORDER — HEPARIN SODIUM (PORCINE) 1000 UNIT/ML IJ SOLN
INTRAMUSCULAR | Status: AC
  Filled 2023-06-19: qty 10

## 2023-06-19 MED ORDER — ASPIRIN 81 MG PO TBEC
81.0000 mg | DELAYED_RELEASE_TABLET | Freq: Every day | ORAL | Status: DC
  Administered 2023-06-20 – 2023-06-23 (×3): 81 mg via ORAL
  Filled 2023-06-19 (×4): qty 1

## 2023-06-19 MED ORDER — CARVEDILOL 12.5 MG PO TABS
12.5000 mg | ORAL_TABLET | Freq: Two times a day (BID) | ORAL | Status: DC
  Administered 2023-06-20 – 2023-06-23 (×7): 12.5 mg via ORAL
  Filled 2023-06-19: qty 1
  Filled 2023-06-19 (×2): qty 4
  Filled 2023-06-19 (×2): qty 1
  Filled 2023-06-19: qty 4
  Filled 2023-06-19: qty 1
  Filled 2023-06-19: qty 4

## 2023-06-19 SURGICAL SUPPLY — 8 items
CATH 5FR JL3.5 JR4 ANG PIG MP (CATHETERS) IMPLANT
CATH VISTA GUIDE 6FR JR4 ECOPK (CATHETERS) IMPLANT
DEVICE RAD COMP TR BAND LRG (VASCULAR PRODUCTS) IMPLANT
ELECT DEFIB PAD ADLT CADENCE (PAD) IMPLANT
GLIDESHEATH SLEND SS 6F .021 (SHEATH) IMPLANT
GUIDEWIRE INQWIRE 1.5J.035X260 (WIRE) IMPLANT
PACK CARDIAC CATHETERIZATION (CUSTOM PROCEDURE TRAY) ×1 IMPLANT
SET ATX-X65L (MISCELLANEOUS) IMPLANT

## 2023-06-19 NOTE — Progress Notes (Signed)
 ANTICOAGULATION CONSULT NOTE  Pharmacy Consult for heparin  Indication: chest pain/ACS  No Known Allergies  Patient Measurements: Height: 5\' 7"  (170.2 cm) Weight: 84 kg (185 lb 3 oz) IBW/kg (Calculated) : 66.1 Heparin  Dosing Weight: 84 kg   Vital Signs: Temp: 98 F (36.7 C) (06/10 1915) Temp Source: Oral (06/10 1915) BP: 109/61 (06/10 2000) Pulse Rate: 53 (06/10 2000)  Labs: Recent Labs    06/19/23 1713 06/19/23 1716 06/19/23 1916  HGB 15.2  --   --   HCT 46.4  --   --   PLT 189  --   --   APTT  --  37*  --   LABPROT  --  14.3  --   INR  --  1.1  --   CREATININE 1.18  --   --   TROPONINIHS 128*  --  211*    Estimated Creatinine Clearance: 56.9 mL/min (by C-G formula based on SCr of 1.18 mg/dL).  Medical History: Past Medical History:  Diagnosis Date   Chronic systolic heart failure (HCC)    CKD (chronic kidney disease)    Coronary artery disease    Diabetes mellitus without complication (HCC)    GERD (gastroesophageal reflux disease)    Glaucoma    Hypercholesteremia    Hypertension    Lesion of lung    [facility]   Myocardial infarction (HCC)    x3   Postablative hypothyroidism 03/18/2020   Sleep apnea    unable to tolerate CPAP    Medications:  See [facility]  Assessment: 73 yo male with hx STEMI s/p RCA stent x2 (2021, 2020) presents with chest pain found to have mvCAD, TCTS consulted for possible CABG.  Not on anticoagulation prior to admission.  Pharmacy consulted for heparin  dosing.  -Received 4000 units of heparin  in ED, 2000 units in cath. -CBC stable - Hgb 15.2, Plt 189.  -TR band removed @ 2200 -TCTS to see - last plavix  dose 6/10 AM  Goal of Therapy:  Heparin  level 0.3-0.7 units/ml Monitor platelets by anticoagulation protocol: Yes   Plan:  START heparin  infusion 1050 units/hr, no bolus, at midnight 8 hour anti-Xa level Daily CBC, anti-Xa level Monitor for s/sx of bleeding  Cecillia Cogan, PharmD Clinical  Pharmacist 06/19/2023  10:04 PM

## 2023-06-19 NOTE — ED Triage Notes (Signed)
 Pt bib ems from home; intermittent cp x 2 days; worse with movement and deep inspiration; pain described as tightness; took 2 nitroglycerin  pta, 324 asa; some relief, 4/10 currently; 18 ga lfa; hx 3 coronary stent placement, DM; 168/90, P 78, 98% RA, RR 18, cbg 110

## 2023-06-19 NOTE — H&P (Signed)
 Cardiology Admission History and Physical   Patient ID: Gregory Cole MRN: [id number]; DOB: 1950-12-15   Admission date: 06/19/2023  [facility] Providers Cardiologist:  Magnus Schuller, MD  Cardiology APP:  Ervin Heath, [place]    Chief Complaint:  Chest pain   History of Present Illness: [name] is a 73 yo male with history of CKD, CAD, DM, GERD, sleep apnea, hypothyroidism, HTN and HLD admitted who presented to the [facility] ED with c/o chest pain. EKG with inferior Q waves and inferior ST elevation. Pt with ongoing pain in the ED. He reports chest pain since yesterday. He has been taking ASA and Plavix . He has a history of CAD with stents in the intermediate branch and RCA. Last cath in 2022 in setting of inferior STEMI secondary to proximal RCA occlusion.    Past Medical History:  Diagnosis Date   Chronic systolic heart failure (HCC)    CKD (chronic kidney disease)    Coronary artery disease    Diabetes mellitus without complication (HCC)    GERD (gastroesophageal reflux disease)    Glaucoma    Hypercholesteremia    Hypertension    Lesion of lung    [facility]   Myocardial infarction (HCC)    x3   Postablative hypothyroidism 03/18/2020   Sleep apnea    unable to tolerate CPAP   Past Surgical History:  Procedure Laterality Date   BOWEL RESECTION     CARDIAC CATHETERIZATION     2 stents in heart   CATARACT EXTRACTION W/PHACO Left 07/01/2013   Procedure: CATARACT EXTRACTION PHACO AND INTRAOCULAR LENS PLACEMENT (IOC);  Surgeon: Lavonia Powers T. Gennie Kicks, MD;  Location: [facility];  Service: Ophthalmology;  Laterality: Left;  CDE 6.84   CORONARY STENT INTERVENTION N/A 04/07/2019   Procedure: CORONARY STENT INTERVENTION;  Surgeon: Millicent Ally, MD;  Location: [facility] INVASIVE CV LAB; mRCA 80% -> STENT RESOLUTE ONYX 3.5X22 (3.6 mm)   CORONARY/GRAFT ACUTE MI REVASCULARIZATION N/A 07/20/2020   Procedure: Coronary/Graft Acute MI  Revascularization - Coronary Stent Intervention;  Surgeon: Arty Binning, MD;  Location: [facility] INVASIVE CV LAB;  Service: Cardiovascular;; proximal RCA 100% occlusion (proximal to previous stent)-3.5 mm 18 mm Onyx Frontier DES (3.75 mm)-overlaps previous stent.   HAND SURGERY Left    LEFT HEART CATH AND CORONARY ANGIOGRAPHY N/A 04/07/2019   Procedure: LEFT HEART CATH AND CORONARY ANGIOGRAPHY;  Surgeon: Millicent Ally, MD;  Location: [facility] INVASIVE CV LAB;  Service: Cardiovascular;  Laterality: N/A;   LEFT HEART CATH AND CORONARY ANGIOGRAPHY N/A 07/20/2020   Procedure: LEFT HEART CATH AND CORONARY ANGIOGRAPHY;  Surgeon: Arty Binning, MD;;  Cultprit Lesion: 100% Thrombotic proximal RCA occlusion, mid RCA stent.  (DES PCI-3.5 mm x18 mm -3.75 mm, overlapping mid RCA stent). tandem 70% & 50% PDA lesions, 50% PL 1.  Collaterals from distal RCA-> LAD. Patent stent in the RI.  20% ostial & imd LAD -> mid LAD severe diffuse disease followed by CTO.   NASAL SEPTUM SURGERY     SHOULDER ARTHROSCOPY Right    TRANSTHORACIC ECHOCARDIOGRAM  07/21/2020   (Post inferior STEMI) moderately reduced LVEF 30 to 35%.  Apical aneurysm with inferior akinesis and inferolateral/inferoseptal/severe HK.  GR 1 DD.  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  Normal RV, unable to assess pressures.  Mild aortic sclerosis but no stenosis.  Elevated RAP/CVP of  estimated 15 mmHg with dilated IVC.     Medications Prior to Admission: Prior to Admission medications   Medication Sig Start Date End Date Taking? Authorizing Provider  aspirin  EC 81 MG tablet Take 81 mg by mouth daily. Evening 1600    [provider]  atorvastatin  (LIPITOR ) 80 MG tablet Take 1 tablet (80 mg total) by mouth daily at 6 PM. Patient not taking: Reported on 08/02/2022 04/08/19 08/02/20  Oral Billings, MD  carvedilol  (COREG ) 12.5 MG tablet Take 6.25 mg by mouth 2 (two) times daily with a meal.     [provider]  clopidogrel  (PLAVIX ) 75 MG tablet Take 1 tablet (75 mg total) by mouth daily. 07/29/20   Cherylene Corrente, MD  cyanocobalamin  (VITAMIN B12) 1000 MCG/ML injection Inject 1 mL (1,000 mcg total) into the muscle once a week. 08/27/22   Evander Hills, PA-C  Empagliflozin -metFORMIN HCl ER (SYNJARDY  XR) 12.05-998 MG TB24 Take 1 tablet by mouth daily. 09/07/22   Milian, Winda Hastings, FNP  ferrous sulfate 325 (65 FE) MG tablet Take 325 mg by mouth daily with breakfast.    [provider]  furosemide  (LASIX ) 40 MG tablet Take 1 tablet (40 mg total) by mouth daily as needed (For weight gain > 3 lbs overnight.). Patient not taking: Reported on 07/26/2021 07/29/20 07/29/21  Cherylene Corrente, MD  levothyroxine  (SYNTHROID , LEVOTHROID) 88 MCG tablet Take 88 mcg by mouth daily before breakfast.    [provider]  nitroGLYCERIN  (NITROSTAT ) 0.4 MG SL tablet Place under the tongue.    [provider]  pantoprazole  (PROTONIX ) 40 MG tablet Take 1 tablet (40 mg total) by mouth daily. 07/23/20   Arty Binning, MD  sacubitril -valsartan  (ENTRESTO ) 49-51 MG Take 1 tablet by mouth 2 (two) times daily. 07/29/20   Cherylene Corrente, MD  Semaglutide , 1 MG/DOSE, 4 MG/3ML SOPN Inject 1 mg as directed once a week. 11/06/22   Milian, Winda Hastings, FNP  Semaglutide ,0.25 or 0.5MG /DOS, 2 MG/3ML SOPN Inject 0.25 mg into the skin once a week. 09/07/22   Milian, Winda Hastings, FNP  Semaglutide ,0.25 or 0.5MG /DOS, 2 MG/3ML SOPN Inject 0.5 mg into the skin once a week. 10/07/22   Milian, Winda Hastings, FNP  enoxaparin  (LOVENOX ) 80 MG/0.8ML injection Inject 0.8 mLs (80 mg total) into the skin every 12 (twelve) hours. 07/22/20 07/23/20  Arty Binning, MD     Allergies:   No Known Allergies  Social History:   Social History   Socioeconomic History   Marital status: Married    Spouse name: Gregory Cole   Number of children: 1   Years of education: Not on file   Highest education  level: Not on file  Occupational History   Occupation: retired    Comment: [employer]  Tobacco Use   Smoking status: Former    Current packs/day: 0.00    Average packs/day: 2.0 packs/day for 40.0 years (80.0 ttl pk-yrs)    Types: Cigarettes    Start date: 01/10/1968    Quit date: 01/10/2008    Years since quitting: 15.4   Smokeless tobacco: Never  Vaping Use   Vaping status: Never Used  Substance and Sexual Activity   Alcohol use: Yes    Comment: occasional glass of wine   Drug use: No   Sexual activity: Not Currently  Other Topics Concern   Not on file  Social History Narrative   Lives with his wife (who has dementia) - first floor of apartment complex  Social Drivers of Corporate investment banker Strain: Low Risk  (07/31/2022)   Overall Financial Resource Strain (CARDIA)    Difficulty of Paying Living Expenses: Not hard at all  Food Insecurity: No Food Insecurity (07/31/2022)   Hunger Vital Sign    Worried About Running Out of Food in the Last Year: Never true    Ran Out of Food in the Last Year: Never true  Transportation Needs: No Transportation Needs (07/31/2022)   PRAPARE - Administrator, Civil Service (Medical): No    Lack of Transportation (Non-Medical): No  Physical Activity: Insufficiently Active (07/31/2022)   Exercise Vital Sign    Days of Exercise per Week: 3 days    Minutes of Exercise per Session: 30 min  Stress: No Stress Concern Present (07/31/2022)   [employer] of Occupational Health - Occupational Stress Questionnaire    Feeling of Stress : Not at all  Social Connections: Moderately Isolated (07/31/2022)   Social Connection and Isolation Panel [NHANES]    Frequency of Communication with Friends and Family: More than three times a week    Frequency of Social Gatherings with Friends and Family: More than three times a week    Attends Religious Services: Never    Database administrator or Organizations: No    Attends Banker Meetings:  Never    Marital Status: Married  Catering manager Violence: Not At Risk (07/31/2022)   Humiliation, Afraid, Rape, and Kick questionnaire    Fear of Current or Ex-Partner: No    Emotionally Abused: No    Physically Abused: No    Sexually Abused: No     Family History:   The patient's family history includes Heart disease in his father; Stroke in his mother. There is no history of Colon cancer.    ROS:  Please see the history of present illness.  All other ROS reviewed and negative.     Physical Exam/Data: Vitals:   06/19/23 1659 06/19/23 1700  BP:  117/77  Pulse:  75  Resp:  (!) 21  Temp: 97.9 F (36.6 C)   TempSrc: Temporal   SpO2:  96%   No intake or output data in the 24 hours ending 06/19/23 1749    09/05/2022   10:09 AM 08/02/2022    8:58 AM 07/31/2022   11:28 AM  Last 3 Weights  Weight (lbs) 176 lb 178 lb 6.4 oz 175 lb  Weight (kg) 79.833 kg 80.922 kg 79.379 kg     There is no height or weight on file to calculate BMI.  General:  Well nourished, well developed, in no acute distress HEENT: normal Neck: no JVD Vascular: No carotid bruits; Distal pulses 2+ bilaterally   Cardiac:  normal S1, S2; RRR; no murmur  Lungs:  clear to auscultation bilaterally, no wheezing, rhonchi or rales  Abd: soft, nontender, no hepatomegaly  Ext: no LE edema Musculoskeletal:  No deformities, BUE and BLE strength normal and equal Skin: warm and dry  Neuro:  CNs 2-12 intact, no focal abnormalities noted Psych:  Normal affect   EKG:  The ECG that was done was personally reviewed and demonstrates sinus with inferior ST elevation  Relevant CV Studies:   Laboratory Data: High Sensitivity Troponin:  No results for input(s): "TROPONINIHS" in the last 720 hours.    Chemistry Recent Labs  Lab 06/19/23 1713  NA 139  K 4.2  CL 108  CO2 22  GLUCOSE 127*  BUN 20  CREATININE  1.18  CALCIUM  8.8*  GFRNONAA >60  ANIONGAP 9    No results for input(s): "PROT", "ALBUMIN", "AST", "ALT",  "ALKPHOS", "BILITOT" in the last 168 hours. Lipids No results for input(s): "CHOL", "TRIG", "HDL", "LABVLDL", "LDLCALC", "CHOLHDL" in the last 168 hours. Hematology Recent Labs  Lab 06/19/23 1713  WBC 6.9  RBC 4.81  HGB 15.2  HCT 46.4  MCV 96.5  MCH 31.6  MCHC 32.8  RDW 13.3  PLT 189   Thyroid  No results for input(s): "TSH", "FREET4" in the last 168 hours. BNPNo results for input(s): "BNP", "PROBNP" in the last 168 hours.  DDimer No results for input(s): "DDIMER" in the last 168 hours.  Radiology/Studies:  DG Chest Portable 1 View Result Date: 06/19/2023 CLINICAL DATA:  cp EXAM: PORTABLE CHEST - 1 VIEW COMPARISON:  April 05, 2019 FINDINGS: Low lung volumes. No focal airspace consolidation, pleural effusion, or pneumothorax. No cardiomegaly. Aortic atherosclerosis. No acute fracture or destructive lesions. Multilevel thoracic osteophytosis. Bilateral AC joint osteoarthritis. IMPRESSION: Low lung volumes.  No acute cardiopulmonary abnormality. Electronically Signed   By: Rance Burrows M.D.   On: 06/19/2023 17:42     Assessment and Plan: Acute inferior STEMI: Emergent cardiac cath DM: Resume home meds post cath HTN: BP controlled.     For questions or updates, please contact [facility] Please consult [email or web address] for contact info under     Signed, Antoinette Batman, MD  06/19/2023 5:49 PM

## 2023-06-19 NOTE — ED Notes (Deleted)
 NT called CCMD to notify that Pt is on the monitor.

## 2023-06-19 NOTE — ED Notes (Signed)
 Code Stemi activated per verbal order of Dr Drury Geralds

## 2023-06-19 NOTE — ED Provider Notes (Signed)
 [place] EMERGENCY DEPARTMENT AT [facility] Provider Note   CSN: [id number] Arrival date & time: 06/19/23  1655     History  No chief complaint on file.   Gregory Cole is a 73 y.o. male with PMH as listed below who presents with chest pain.  Central chest pain that started yesterday, improved by a couple nitroglycerin  yesterday. Today while changing oil in his car had recurrent chest "heaviness' that feels like previous heart attacks. Pain radiates into his back and down his left arm. Rated 6/10. Improves somewhat with nitroglycerin . Received 324 mg ASA with EMS. Denies SOB or N/V. Endorses an episode of cold sweats. Last oral intake was 12 pm today.  EKG demonstrates ST elevations inferiorly, ST depressions in V2. New from prior. Code STEMI called.  Past Medical History:  Diagnosis Date   Chronic systolic heart failure (HCC)    CKD (chronic kidney disease)    Coronary artery disease    Diabetes mellitus without complication (HCC)    GERD (gastroesophageal reflux disease)    Glaucoma    Hypercholesteremia    Hypertension    Lesion of lung    [facility]   Myocardial infarction (HCC)    x3   Postablative hypothyroidism 03/18/2020   Sleep apnea    unable to tolerate CPAP       Home Medications Prior to Admission medications   Medication Sig Start Date End Date Taking? Authorizing Provider  aspirin  EC 81 MG tablet Take 81 mg by mouth daily. Evening 1600    [provider]  atorvastatin  (LIPITOR ) 80 MG tablet Take 1 tablet (80 mg total) by mouth daily at 6 PM. Patient not taking: Reported on 08/02/2022 04/08/19 08/02/20  Oral Billings, MD  carvedilol  (COREG ) 12.5 MG tablet Take 6.25 mg by mouth 2 (two) times daily with a meal.    [provider]  clopidogrel  (PLAVIX ) 75 MG tablet Take 1 tablet (75 mg total) by mouth daily. 07/29/20   Cherylene Corrente, MD  cyanocobalamin  (VITAMIN B12) 1000 MCG/ML injection Inject 1 mL  (1,000 mcg total) into the muscle once a week. 08/27/22   Evander Hills, PA-C  Empagliflozin -metFORMIN HCl ER (SYNJARDY  XR) 12.05-998 MG TB24 Take 1 tablet by mouth daily. 09/07/22   Milian, Winda Hastings, FNP  ferrous sulfate 325 (65 FE) MG tablet Take 325 mg by mouth daily with breakfast.    [provider]  furosemide  (LASIX ) 40 MG tablet Take 1 tablet (40 mg total) by mouth daily as needed (For weight gain > 3 lbs overnight.). Patient not taking: Reported on 07/26/2021 07/29/20 07/29/21  Cherylene Corrente, MD  levothyroxine  (SYNTHROID , LEVOTHROID) 88 MCG tablet Take 88 mcg by mouth daily before breakfast.    [provider]  nitroGLYCERIN  (NITROSTAT ) 0.4 MG SL tablet Place under the tongue.    [provider]  pantoprazole  (PROTONIX ) 40 MG tablet Take 1 tablet (40 mg total) by mouth daily. 07/23/20   Arty Binning, MD  sacubitril -valsartan  (ENTRESTO ) 49-51 MG Take 1 tablet by mouth 2 (two) times daily. 07/29/20   Cherylene Corrente, MD  Semaglutide , 1 MG/DOSE, 4 MG/3ML SOPN Inject 1 mg as directed once a week. 11/06/22   Milian, Winda Hastings, FNP  Semaglutide ,0.25 or 0.5MG /DOS, 2 MG/3ML SOPN Inject 0.25 mg into the skin once a week. 09/07/22   Milian, Winda Hastings, FNP  Semaglutide ,0.25 or 0.5MG /DOS, 2 MG/3ML SOPN Inject 0.5 mg into the skin once a  week. 10/07/22   Chrystine Crate, FNP  enoxaparin  (LOVENOX ) 80 MG/0.8ML injection Inject 0.8 mLs (80 mg total) into the skin every 12 (twelve) hours. 07/22/20 07/23/20  Arty Binning, MD      Allergies    Patient has no known allergies.    Review of Systems   Review of Systems A 10 point review of systems was performed and is negative unless otherwise reported in HPI.  Physical Exam Updated Vital Signs BP 117/77   Pulse 75   Temp 97.9 F (36.6 C) (Temporal)   Resp (!) 21   Ht 5' 7.5" (1.715 m)   Wt 84 kg   SpO2 96%   BMI 28.58 kg/m  Physical Exam General: Normal appearing male, lying in bed.   HEENT: Sclera anicteric, MMM, trachea midline.  Cardiology: RRR, no murmurs/rubs/gallops. BL radial and DP pulses equal bilaterally.  Resp: Normal respiratory rate and effort. CTAB, no wheezes, rhonchi, crackles.  Abd: Soft, non-tender, non-distended. No rebound tenderness or guarding.  GU: Deferred. MSK: No peripheral edema or signs of trauma. Extremities without deformity or TTP. No cyanosis or clubbing. Skin: warm, dry.  Neuro: A&Ox4, CNs II-XII grossly intact. MAEs. Sensation grossly intact.  Psych: Normal mood and affect.   ED Results / Procedures / Treatments   Labs (all labs ordered are listed, but only abnormal results are displayed) Labs Reviewed  BASIC METABOLIC PANEL WITH GFR - Abnormal; Notable for the following components:      Result Value   Glucose, Bld 127 (*)    Calcium  8.8 (*)    All other components within normal limits  TROPONIN I (HIGH SENSITIVITY) - Abnormal; Notable for the following components:   Troponin I (High Sensitivity) 128 (*)    All other components within normal limits  CBC  PROTIME-INR  APTT    EKG EKG Interpretation Date/Time:  Tuesday June 19 2023 16:58:21 EDT Ventricular Rate:  77 PR Interval:  175 QRS Duration:  94 QT Interval:  355 QTC Calculation: 402 R Axis:   -29  Text Interpretation: Sinus rhythm Inferior infarct, acute (RCA) Probable anterolateral infarct, age indeterm Probable RV involvement, suggest recording right precordial leads >>> Acute MI <<< Confirmed by Annita Kindle [phone]) on 06/19/2023 6:05:21 PM  Radiology DG Chest Portable 1 View Result Date: 06/19/2023 CLINICAL DATA:  cp EXAM: PORTABLE CHEST - 1 VIEW COMPARISON:  April 05, 2019 FINDINGS: Low lung volumes. No focal airspace consolidation, pleural effusion, or pneumothorax. No cardiomegaly. Aortic atherosclerosis. No acute fracture or destructive lesions. Multilevel thoracic osteophytosis. Bilateral AC joint osteoarthritis. IMPRESSION: Low lung volumes.  No acute  cardiopulmonary abnormality. Electronically Signed   By: Rance Burrows M.D.   On: 06/19/2023 17:42    Procedures .Critical Care  Performed by: Merdis Stalling, MD Authorized by: Merdis Stalling, MD   Critical care provider statement:    Critical care time (minutes):  30   Critical care was time spent personally by me on the following activities:  Development of treatment plan with patient or surrogate, discussions with consultants, examination of patient, ordering and review of laboratory studies, ordering and review of radiographic studies, ordering and performing treatments and interventions, pulse oximetry, re-evaluation of patient's condition, review of old charts and obtaining history from patient or surrogate   Care discussed with: admitting provider       Medications Ordered in ED Medications  nitroGLYCERIN  100 mcg/mL intra-arterial injection (has no administration in time range)  0.9 %  sodium chloride  infusion (10  mL/hr Intravenous New Bag/Given 06/19/23 1752)  lidocaine  (PF) (XYLOCAINE ) 1 % injection (2 mLs Other Given 06/19/23 1758)  Radial Cocktail/Verapamil  only (10 mLs Intra-arterial Given 06/19/23 1759)  heparin  injection 4,000 Units (4,000 Units Intravenous Given 06/19/23 1734)    ED Course/ Medical Decision Making/ A&P                          Medical Decision Making Amount and/or Complexity of Data Reviewed Labs: ordered. Radiology: ordered.  Risk Prescription drug management. Decision regarding hospitalization.    This patient presents to the ED for concern of chest pain, this involves an extensive number of treatment options, and is a complaint that carries with it a high risk of complications and morbidity.  I considered the following differential and admission for this acute, potentially life threatening condition.   MDM:    Patient with EKG findings concern for inferior/right-sided STEMI.   Equal pulses in all extremities, no neuro deficits. No widened  mediastinum on CXR. Lower c/f aortic dissection. Patient also states this pain feels like his previous heart attacks.   Dr. Abel Hoe with STEMI cards came to evaluate patient. Will send directly to cath lab. Already received ASA. Ordered 4000 U of heparin .    Clinical Course as of 06/19/23 1805  Tue Jun 19, 2023  1717 +EKG changes. Paging STEMI doctor. [HN]  1721 Discussing with Dr. Abel Hoe - will call STEMI [HN]  1734 Sending to cath lab. [HN]    Clinical Course User Index [HN] Merdis Stalling, MD    Labs: I Ordered, and personally interpreted labs.  The pertinent results include:  those listed above  Imaging Studies ordered: I ordered imaging studies including CXR I independently visualized and interpreted imaging. I agree with the radiologist interpretation  Additional history obtained from chart review.   Cardiac Monitoring: The patient was maintained on a cardiac monitor.  I personally viewed and interpreted the cardiac monitored which showed an underlying rhythm of: NSR  Reevaluation: After the interventions noted above, I reevaluated the patient and found that they have :improved  Social Determinants of Health: Lives independently. Cares for alzheimer's.   Disposition:  to cath lab emergently  Co morbidities that complicate the patient evaluation  Past Medical History:  Diagnosis Date   Chronic systolic heart failure (HCC)    CKD (chronic kidney disease)    Coronary artery disease    Diabetes mellitus without complication (HCC)    GERD (gastroesophageal reflux disease)    Glaucoma    Hypercholesteremia    Hypertension    Lesion of lung    [facility]   Myocardial infarction (HCC)    x3   Postablative hypothyroidism 03/18/2020   Sleep apnea    unable to tolerate CPAP     Medicines Meds ordered this encounter  Medications   heparin  injection 4,000 Units   nitroGLYCERIN  100 mcg/mL intra-arterial injection    Boothe, Logan H:  cabinet override   0.9 %  sodium chloride  infusion   lidocaine  (PF) (XYLOCAINE ) 1 % injection   Radial Cocktail/Verapamil  only    I have reviewed the patients home medicines and have made adjustments as needed  Problem List / ED Course: Problem List Items Addressed This Visit   None Visit Diagnoses       ST elevation myocardial infarction (STEMI), unspecified artery (HCC)    -  Primary   Relevant Medications   heparin  injection 4,000 Units (Completed)  nitroGLYCERIN  100 mcg/mL intra-arterial injection   Radial Cocktail/Verapamil  only                   This note was created using dictation software, which may contain spelling or grammatical errors.    Merdis Stalling, MD 06/19/23 [phone]

## 2023-06-20 ENCOUNTER — Encounter (HOSPITAL_COMMUNITY): Payer: Self-pay | Admitting: Cardiovascular Disease

## 2023-06-20 ENCOUNTER — Other Ambulatory Visit (HOSPITAL_COMMUNITY): Payer: Self-pay

## 2023-06-20 ENCOUNTER — Inpatient Hospital Stay (HOSPITAL_COMMUNITY)

## 2023-06-20 DIAGNOSIS — I25110 Atherosclerotic heart disease of native coronary artery with unstable angina pectoris: Secondary | ICD-10-CM

## 2023-06-20 LAB — MAGNESIUM: Magnesium: 1.9 mg/dL (ref 1.7–2.4)

## 2023-06-20 LAB — LIPID PANEL
Cholesterol: 161 mg/dL (ref 0–200)
HDL: 33 mg/dL — ABNORMAL LOW (ref >40)
LDL Cholesterol: 107 mg/dL — ABNORMAL HIGH (ref 0–99)
Total CHOL/HDL Ratio: 4.9 ratio
Triglycerides: 105 mg/dL (ref <150)
VLDL: 21 mg/dL (ref 0–40)

## 2023-06-20 LAB — BASIC METABOLIC PANEL WITH GFR
Anion gap: 5 (ref 5–15)
BUN: 18 mg/dL (ref 8–23)
CO2: 26 mmol/L (ref 22–32)
Calcium: 8.6 mg/dL — ABNORMAL LOW (ref 8.9–10.3)
Chloride: 109 mmol/L (ref 98–111)
Creatinine, Ser: 1.07 mg/dL (ref 0.61–1.24)
GFR, Estimated: >60 mL/min (ref >60)
Glucose, Bld: 116 mg/dL — ABNORMAL HIGH (ref 70–99)
Potassium: 4.3 mmol/L (ref 3.5–5.1)
Sodium: 140 mmol/L (ref 135–145)

## 2023-06-20 LAB — ECHOCARDIOGRAM COMPLETE
Area-P 1/2: 2.48 cm2
Calc EF: 37.1 %
Height: 67 in
S' Lateral: 4.3 cm
Single Plane A2C EF: 42.9 %
Single Plane A4C EF: 29.4 %
Weight: 2962.98 [oz_av]

## 2023-06-20 LAB — MRSA NEXT GEN BY PCR, NASAL: MRSA by PCR Next Gen: NOT DETECTED

## 2023-06-20 LAB — CBC
HCT: 44.2 % (ref 39.0–52.0)
Hemoglobin: 14.5 g/dL (ref 13.0–17.0)
MCH: 31.7 pg (ref 26.0–34.0)
MCHC: 32.8 g/dL (ref 30.0–36.0)
MCV: 96.7 fL (ref 80.0–100.0)
Platelets: 177 10*3/uL (ref 150–400)
RBC: 4.57 MIL/uL (ref 4.22–5.81)
RDW: 13.6 % (ref 11.5–15.5)
WBC: 9.9 10*3/uL (ref 4.0–10.5)
nRBC: 0 % (ref 0.0–0.2)

## 2023-06-20 LAB — CG4 I-STAT (LACTIC ACID): Lactic Acid, Venous: 0.7 mmol/L (ref 0.5–1.9)

## 2023-06-20 LAB — HEMOGLOBIN A1C
Hgb A1c MFr Bld: 7.2 % — ABNORMAL HIGH (ref 4.8–5.6)
Mean Plasma Glucose: 159.94 mg/dL

## 2023-06-20 LAB — HEPARIN LEVEL (UNFRACTIONATED)
Heparin Unfractionated: 0.27 [IU]/mL — ABNORMAL LOW (ref 0.30–0.70)
Heparin Unfractionated: 0.44 [IU]/mL (ref 0.30–0.70)

## 2023-06-20 MED ORDER — TICAGRELOR 90 MG PO TABS: 90.0000 mg | ORAL_TABLET | Freq: Two times a day (BID) | ORAL | Status: DC

## 2023-06-20 MED ORDER — PRASUGREL HCL 10 MG PO TABS
60.0000 mg | ORAL_TABLET | Freq: Once | ORAL | Status: AC
  Administered 2023-06-20: 60 mg via ORAL
  Filled 2023-06-20: qty 6

## 2023-06-20 MED ORDER — MORPHINE SULFATE (PF) 2 MG/ML IV SOLN: 2.0000 mg | INTRAVENOUS | Status: DC | PRN

## 2023-06-20 MED ORDER — PRASUGREL HCL 10 MG PO TABS
10.0000 mg | ORAL_TABLET | Freq: Every day | ORAL | Status: DC
  Administered 2023-06-21 – 2023-06-23 (×3): 10 mg via ORAL
  Filled 2023-06-20 (×3): qty 1

## 2023-06-20 MED ORDER — CHLORHEXIDINE GLUCONATE CLOTH 2 % EX PADS
6.0000 | MEDICATED_PAD | Freq: Every day | CUTANEOUS | Status: DC
  Administered 2023-06-20 – 2023-06-22 (×3): 6 via TOPICAL

## 2023-06-20 MED ORDER — ORAL CARE MOUTH RINSE: 15.0000 mL | OROMUCOSAL | Status: DC | PRN

## 2023-06-20 MED ORDER — PERFLUTREN LIPID MICROSPHERE
1.0000 mL | INTRAVENOUS | Status: AC | PRN
  Administered 2023-06-20: 2 mL via INTRAVENOUS

## 2023-06-20 MED ORDER — SODIUM CHLORIDE 0.9 % WEIGHT BASED INFUSION
3.0000 mL/kg/h | INTRAVENOUS | Status: DC
  Administered 2023-06-21: 3 mL/kg/h via INTRAVENOUS

## 2023-06-20 MED ORDER — MAGNESIUM SULFATE 2 GM/50ML IV SOLN
2.0000 g | Freq: Once | INTRAVENOUS | Status: AC
  Administered 2023-06-20: 2 g via INTRAVENOUS
  Filled 2023-06-20: qty 50

## 2023-06-20 MED ORDER — TICAGRELOR 90 MG PO TABS
180.0000 mg | ORAL_TABLET | Freq: Once | ORAL | Status: DC
  Filled 2023-06-20: qty 2

## 2023-06-20 MED ORDER — SODIUM CHLORIDE 0.9 % WEIGHT BASED INFUSION
1.0000 mL/kg/h | INTRAVENOUS | Status: DC
  Administered 2023-06-21: 1 mL/kg/h via INTRAVENOUS

## 2023-06-20 MED ORDER — ASPIRIN 81 MG PO CHEW
81.0000 mg | CHEWABLE_TABLET | ORAL | Status: AC
  Administered 2023-06-21: 81 mg via ORAL
  Filled 2023-06-20: qty 1

## 2023-06-20 MED FILL — Nitroglycerin IV Soln 100 MCG/ML in D5W: INTRA_ARTERIAL | Qty: 10 | Status: AC

## 2023-06-20 NOTE — Plan of Care (Signed)
   Problem: Education: Goal: Knowledge of General Education information will improve Description: Including pain rating scale, medication(s)/side effects and non-pharmacologic comfort measures Outcome: Progressing   Problem: Clinical Measurements: Goal: Will remain free from infection Outcome: Progressing

## 2023-06-20 NOTE — Progress Notes (Addendum)
 ANTICOAGULATION CONSULT NOTE  Pharmacy Consult for heparin  Indication: chest pain/ACS  No Known Allergies  Patient Measurements: Height: 5' 7 (170.2 cm) Weight: 84 kg (185 lb 3 oz) IBW/kg (Calculated) : 66.1 Heparin  Dosing Weight: 84 kg   Vital Signs: Temp: 98.2 F (36.8 C) (06/11 1606) Temp Source: Oral (06/11 1606) BP: 107/65 (06/11 1729) Pulse Rate: 61 (06/11 1729)  Labs: Recent Labs    06/19/23 1713 06/19/23 1716 06/19/23 1916 06/20/23 0412 06/20/23 0811 06/20/23 1607  HGB 15.2  --   --  14.5  --   --   HCT 46.4  --   --  44.2  --   --   PLT 189  --   --  177  --   --   APTT  --  37*  --   --   --   --   LABPROT  --  14.3  --   --   --   --   INR  --  1.1  --   --   --   --   HEPARINUNFRC  --   --   --   --  0.27* 0.44  CREATININE 1.18  --   --  1.07  --   --   TROPONINIHS 128*  --  211*  --   --   --     Estimated Creatinine Clearance: 62.8 mL/min (by C-G formula based on SCr of 1.07 mg/dL).  Medical History: Past Medical History:  Diagnosis Date   Chronic systolic heart failure (HCC)    CKD (chronic kidney disease)    Coronary artery disease    Diabetes mellitus without complication (HCC)    GERD (gastroesophageal reflux disease)    Glaucoma    Hypercholesteremia    Hypertension    Lesion of lung    [facility]   Myocardial infarction (HCC)    x3   Postablative hypothyroidism 03/18/2020   Sleep apnea    unable to tolerate CPAP    Medications:  See [facility]  Assessment: 73 yo male with hx STEMI s/p RCA stent x2 (2021, 2020) presents with chest pain found to have mvCAD, TCTS consulted for possible CABG.  Not on anticoagulation prior to admission.  Pharmacy consulted for heparin  dosing.  Heparin  level slightly below goal at 0.27, no bleeding issues documented. CBC ok this am.  6/11 PM  - heparin  level at goal.  No issues.  Goal of Therapy:  Heparin  level 0.3-0.7 units/ml Monitor platelets by anticoagulation protocol: Yes    Plan:  Continue heparin  IV 1250 units/h Daily heparin  level, CBC F/u after staged PCI tomorrow  Cecillia Cogan, PharmD Clinical Pharmacist 06/20/2023  5:34 PM

## 2023-06-20 NOTE — Progress Notes (Signed)
  Progress Note  Patient Name: Gregory Cole Date of Encounter: 06/20/2023 [facility] Cardiologist: Magnus Schuller, MD   Interval Summary   Patient with low level chest discomfort but muchimproved.    Vital Signs Vitals:   06/20/23 0845 06/20/23 0900 06/20/23 0908 06/20/23 0915  BP: (!) 89/57 115/65 114/65 120/72  Pulse: 71 88 86 83  Resp: 20 19 (!) 23 (!) 22  Temp:      TempSrc:      SpO2: 93% 94% 95% 95%  Weight:      Height:        Intake/Output Summary (Last 24 hours) at 06/20/2023 0940 Last data filed at 06/20/2023 0800 Gross per 24 hour  Intake 801.54 ml  Output 1100 ml  Net -298.46 ml      06/19/2023    7:15 PM 06/19/2023    5:00 PM 09/05/2022   10:09 AM  Last 3 Weights  Weight (lbs) 185 lb 3 oz 185 lb 3 oz 176 lb  Weight (kg) 84 kg 84 kg 79.833 kg      Telemetry/ECG  SR - Personally Reviewed  Physical Exam  GEN: No acute distress.   Neck: No JVD Cardiac: RRR, no murmurs, rubs, or gallops.  Respiratory: Clear to auscultation bilaterally. GI: Soft, nontender, non-distended  MS: No edema: 2+ radials bilaterally  Assessment & Plan  ACS:  Dynamic inferior STE >> cath yesterday with Lambert Pillion 1:1:1 disease of RCA crux, patent RCA and ramus stents, occluded LAD -Histoyr of remove DES ramus, DES  mRCA 2021; inferior stemi DES pRCA 2022 -CMR 2022 with no viability of L apex with aneurysm; no HF or VT.  I discussed with Dr. Deloise Ferries, no need for surgery -Continue heparin , nitroglycerin  infusions -Coreg  12.5 mg BID -ASA 81 -Load with Ticagrelor  180, start 90 BID -PRN morphine -Will refer for PCI either tomorrow  -NPO at MN -F/U TTE 2.  LV apical aneurysm:    See discussion above, no need for intervention 3.  HL:  LDL goal < 55  -Atorvastatin  80      For questions or updates, please contact [facility] Please consult [email or web address] for contact info under       Signed, Olsen Mccutchan K Cherelle Midkiff, MD

## 2023-06-20 NOTE — Progress Notes (Addendum)
 ANTICOAGULATION CONSULT NOTE  Pharmacy Consult for heparin  Indication: chest pain/ACS  No Known Allergies  Patient Measurements: Height: 5' 7 (170.2 cm) Weight: 84 kg (185 lb 3 oz) IBW/kg (Calculated) : 66.1 Heparin  Dosing Weight: 84 kg   Vital Signs: Temp: 98.3 F (36.8 C) (06/11 0740) Temp Source: Oral (06/11 0740) BP: 105/55 (06/11 1000) Pulse Rate: 72 (06/11 1000)  Labs: Recent Labs    06/19/23 1713 06/19/23 1716 06/19/23 1916 06/20/23 0412 06/20/23 0811  HGB 15.2  --   --  14.5  --   HCT 46.4  --   --  44.2  --   PLT 189  --   --  177  --   APTT  --  37*  --   --   --   LABPROT  --  14.3  --   --   --   INR  --  1.1  --   --   --   HEPARINUNFRC  --   --   --   --  0.27*  CREATININE 1.18  --   --  1.07  --   TROPONINIHS 128*  --  211*  --   --     Estimated Creatinine Clearance: 62.8 mL/min (by C-G formula based on SCr of 1.07 mg/dL).  Medical History: Past Medical History:  Diagnosis Date   Chronic systolic heart failure (HCC)    CKD (chronic kidney disease)    Coronary artery disease    Diabetes mellitus without complication (HCC)    GERD (gastroesophageal reflux disease)    Glaucoma    Hypercholesteremia    Hypertension    Lesion of lung    [facility]   Myocardial infarction (HCC)    x3   Postablative hypothyroidism 03/18/2020   Sleep apnea    unable to tolerate CPAP    Medications:  See [facility]  Assessment: 73 yo male with hx STEMI s/p RCA stent x2 (2021, 2020) presents with chest pain found to have mvCAD, TCTS consulted for possible CABG.  Not on anticoagulation prior to admission.  Pharmacy consulted for heparin  dosing.  Heparin  level slightly below goal at 0.27, no bleeding issues documented. CBC ok this am.  Goal of Therapy:  Heparin  level 0.3-0.7 units/ml Monitor platelets by anticoagulation protocol: Yes   Plan:  Increase heparin  to 1250 units/h Recheck heparin  level in 6h  Levin Reamer, PharmD, [place],  [facility] Clinical Pharmacist [phone] Please check AMION for all [facility] Pharmacy numbers 06/20/2023

## 2023-06-21 ENCOUNTER — Encounter (HOSPITAL_COMMUNITY): Payer: Self-pay | Admitting: Cardiovascular Disease

## 2023-06-21 ENCOUNTER — Encounter (HOSPITAL_COMMUNITY)
Admission: EM | Disposition: A | Discharge status: Home / Self Care | Payer: Self-pay | Source: Home / Self Care | Attending: Cardiovascular Disease

## 2023-06-21 HISTORY — PX: CORONARY STENT INTERVENTION: CATH118234

## 2023-06-21 LAB — CBC
HCT: 44.7 % (ref 39.0–52.0)
Hemoglobin: 14.9 g/dL (ref 13.0–17.0)
MCH: 31.8 pg (ref 26.0–34.0)
MCHC: 33.3 g/dL (ref 30.0–36.0)
MCV: 95.5 fL (ref 80.0–100.0)
Platelets: 181 10*3/uL (ref 150–400)
RBC: 4.68 MIL/uL (ref 4.22–5.81)
RDW: 13.7 % (ref 11.5–15.5)
WBC: 8.5 10*3/uL (ref 4.0–10.5)
nRBC: 0 % (ref 0.0–0.2)

## 2023-06-21 LAB — POCT ACTIVATED CLOTTING TIME
Activated Clotting Time: 343 s
Activated Clotting Time: 354 s
Activated Clotting Time: 360 s
Activated Clotting Time: 418 s
Activated Clotting Time: 464 s
Activated Clotting Time: 268 s

## 2023-06-21 LAB — HEPARIN LEVEL (UNFRACTIONATED): Heparin Unfractionated: 0.45 [IU]/mL (ref 0.30–0.70)

## 2023-06-21 LAB — BASIC METABOLIC PANEL WITH GFR
Anion gap: 7 (ref 5–15)
BUN: 17 mg/dL (ref 8–23)
CO2: 25 mmol/L (ref 22–32)
Calcium: 8.6 mg/dL — ABNORMAL LOW (ref 8.9–10.3)
Chloride: 105 mmol/L (ref 98–111)
Creatinine, Ser: 1.23 mg/dL (ref 0.61–1.24)
GFR, Estimated: >60 mL/min (ref >60)
Glucose, Bld: 119 mg/dL — ABNORMAL HIGH (ref 70–99)
Potassium: 4 mmol/L (ref 3.5–5.1)
Sodium: 137 mmol/L (ref 135–145)

## 2023-06-21 LAB — MAGNESIUM: Magnesium: 2.3 mg/dL (ref 1.7–2.4)

## 2023-06-21 SURGERY — CORONARY STENT INTERVENTION
Anesthesia: LOCAL

## 2023-06-21 MED ORDER — SODIUM CHLORIDE 0.9% FLUSH
3.0000 mL | Freq: Two times a day (BID) | INTRAVENOUS | Status: DC
  Administered 2023-06-21 – 2023-06-23 (×4): 3 mL via INTRAVENOUS

## 2023-06-21 MED ORDER — NITROGLYCERIN 1 MG/10 ML FOR IR/CATH LAB
INTRA_ARTERIAL | Status: AC
  Filled 2023-06-21: qty 10

## 2023-06-21 MED ORDER — FENTANYL CITRATE (PF) 100 MCG/2ML IJ SOLN
INTRAMUSCULAR | Status: AC
Start: 2023-06-21 — End: 2023-06-21
  Filled 2023-06-21: qty 2

## 2023-06-21 MED ORDER — SODIUM CHLORIDE 0.9% FLUSH: 3.0000 mL | INTRAVENOUS | Status: DC | PRN

## 2023-06-21 MED ORDER — SODIUM CHLORIDE 0.9 % IV SOLN: 250.0000 mL | INTRAVENOUS | Status: AC | PRN

## 2023-06-21 MED ORDER — IOHEXOL 350 MG/ML SOLN
INTRAVENOUS | Status: DC | PRN
  Administered 2023-06-21: 80 mL

## 2023-06-21 MED ORDER — HEPARIN SODIUM (PORCINE) 1000 UNIT/ML IJ SOLN
INTRAMUSCULAR | Status: AC
  Filled 2023-06-21: qty 10

## 2023-06-21 MED ORDER — NITROGLYCERIN 1 MG/10 ML FOR IR/CATH LAB
INTRA_ARTERIAL | Status: DC | PRN
  Administered 2023-06-21 (×2): 100 ug via INTRACORONARY

## 2023-06-21 MED ORDER — HEPARIN (PORCINE) IN NACL 1000-0.9 UT/500ML-% IV SOLN
INTRAVENOUS | Status: DC | PRN
  Administered 2023-06-21 (×3): 500 mL

## 2023-06-21 MED ORDER — VERAPAMIL HCL 2.5 MG/ML IV SOLN
INTRAVENOUS | Status: DC | PRN
  Administered 2023-06-21: 10 mL via INTRA_ARTERIAL

## 2023-06-21 MED ORDER — HYDRALAZINE HCL 20 MG/ML IJ SOLN: 10.0000 mg | INTRAMUSCULAR | Status: AC | PRN

## 2023-06-21 MED ORDER — LIDOCAINE HCL (PF) 1 % IJ SOLN
INTRAMUSCULAR | Status: DC | PRN
  Administered 2023-06-21: 2 mL via INTRADERMAL

## 2023-06-21 MED ORDER — LIDOCAINE HCL (PF) 1 % IJ SOLN
INTRAMUSCULAR | Status: AC
  Filled 2023-06-21: qty 30

## 2023-06-21 MED ORDER — MIDAZOLAM HCL 2 MG/2ML IJ SOLN
INTRAMUSCULAR | Status: AC
  Filled 2023-06-21: qty 2

## 2023-06-21 MED ORDER — FENTANYL CITRATE (PF) 100 MCG/2ML IJ SOLN
INTRAMUSCULAR | Status: DC | PRN
  Administered 2023-06-21: 25 ug via INTRAVENOUS

## 2023-06-21 MED ORDER — MIDAZOLAM HCL 2 MG/2ML IJ SOLN
INTRAMUSCULAR | Status: DC | PRN
  Administered 2023-06-21: 1 mg via INTRAVENOUS

## 2023-06-21 MED ORDER — VERAPAMIL HCL 2.5 MG/ML IV SOLN
INTRAVENOUS | Status: AC
Start: 2023-06-21 — End: 2023-06-21
  Filled 2023-06-21: qty 2

## 2023-06-21 MED ORDER — HEPARIN SODIUM (PORCINE) 1000 UNIT/ML IJ SOLN
INTRAMUSCULAR | Status: DC | PRN
  Administered 2023-06-21: 2000 [IU] via INTRAVENOUS
  Administered 2023-06-21: 8000 [IU] via INTRAVENOUS

## 2023-06-21 SURGICAL SUPPLY — 21 items
BALLOON EMERGE MR 2.25X12 (BALLOONS) IMPLANT
BALLOON EMERGE MR 2.25X15 (BALLOONS) IMPLANT
BALLOON EMERGE MR 2.5X12 (BALLOONS) IMPLANT
BALLOON SAPPHIRE NC24 2.75X12 (BALLOONS) IMPLANT
BALLOON TAKERU 2.0X12 (BALLOONS) IMPLANT
BALLOON ~~LOC~~ EMERGE MR 2.75X8 (BALLOONS) IMPLANT
BALLOON ~~LOC~~ EMERGE MR 3.0X8 (BALLOONS) IMPLANT
BALLOON ~~LOC~~ EMERGE MR 3.25X8 (BALLOONS) IMPLANT
CATH INFINITI JR4 5F (CATHETERS) IMPLANT
CATH LAUNCHER 6FR AL.75 (CATHETERS) IMPLANT
DEVICE RAD COMP TR BAND LRG (VASCULAR PRODUCTS) IMPLANT
ELECT DEFIB PAD ADLT CADENCE (PAD) IMPLANT
GLIDESHEATH SLEND SS 6F .021 (SHEATH) IMPLANT
GUIDEWIRE INQWIRE 1.5J.035X260 (WIRE) IMPLANT
KIT ENCORE 26 ADVANTAGE (KITS) IMPLANT
PACK CARDIAC CATHETERIZATION (CUSTOM PROCEDURE TRAY) ×1 IMPLANT
SET ATX-X65L (MISCELLANEOUS) IMPLANT
STENT SYNERGY XD 2.50X12 (Permanent Stent) IMPLANT
STENT SYNERGY XD 2.75X24 (Permanent Stent) IMPLANT
WIRE RUNTHROUGH .014X180CM (WIRE) IMPLANT
WIRE RUNTHROUGH IZANAI 014 180 (WIRE) IMPLANT

## 2023-06-21 NOTE — Interval H&P Note (Signed)
 History and Physical Interval Note:  06/21/2023 4:07 PM  Gregory Cole  has presented today for surgery, with the diagnosis of NSTEMI.  The various methods of treatment have been discussed with the patient and family. After consideration of risks, benefits and other options for treatment, the patient has consented to  Procedure(s): CORONARY STENT INTERVENTION (N/A) as a surgical intervention.  The patient's history has been reviewed, patient examined, no change in status, stable for surgery.  I have reviewed the patient's chart and labs.  Questions were answered to the patient's satisfaction.    Cath Lab Visit (complete for each Cath Lab visit)  Clinical Evaluation Leading to the Procedure:   ACS: Yes.    Non-ACS:  N/A  Shaunta Oncale

## 2023-06-21 NOTE — Progress Notes (Signed)
 ANTICOAGULATION CONSULT NOTE  Pharmacy Consult for heparin  Indication: chest pain/ACS  No Known Allergies  Patient Measurements: Height: 5' 7 (170.2 cm) Weight: 84 kg (185 lb 3 oz) IBW/kg (Calculated) : 66.1 Heparin  Dosing Weight: 84 kg   Vital Signs: Temp: 98.1 F (36.7 C) (06/12 0312) Temp Source: Oral (06/12 0312) BP: 102/69 (06/12 0700) Pulse Rate: 54 (06/12 0700)  Labs: Recent Labs    06/19/23 1713 06/19/23 1716 06/19/23 1916 06/20/23 0412 06/20/23 0811 06/20/23 1607 06/21/23 0306  HGB 15.2  --   --  14.5  --   --  14.9  HCT 46.4  --   --  44.2  --   --  44.7  PLT 189  --   --  177  --   --  181  APTT  --  37*  --   --   --   --   --   LABPROT  --  14.3  --   --   --   --   --   INR  --  1.1  --   --   --   --   --   HEPARINUNFRC  --   --   --   --  0.27* 0.44 0.45  CREATININE 1.18  --   --  1.07  --   --  1.23  TROPONINIHS 128*  --  211*  --   --   --   --     Estimated Creatinine Clearance: 54.6 mL/min (by C-G formula based on SCr of 1.23 mg/dL).  Medical History: Past Medical History:  Diagnosis Date   Chronic systolic heart failure (HCC)    CKD (chronic kidney disease)    Coronary artery disease    Diabetes mellitus without complication (HCC)    GERD (gastroesophageal reflux disease)    Glaucoma    Hypercholesteremia    Hypertension    Lesion of lung    [facility]   Myocardial infarction (HCC)    x3   Postablative hypothyroidism 03/18/2020   Sleep apnea    unable to tolerate CPAP    Medications:  See [facility]  Assessment: 73 yo male with hx STEMI s/p RCA stent x2 (2021, 2020) presents with chest pain found to have mvCAD, TCTS consulted for possible CABG.  Not on anticoagulation prior to admission.  Pharmacy consulted for heparin  dosing.  Heparin  level therapeutic 0.45, CBC stable.  Goal of Therapy:  Heparin  level 0.3-0.7 units/ml Monitor platelets by anticoagulation protocol: Yes   Plan:  Continue heparin  1250  units/h Daily heparin  level, CBC  Levin Reamer, PharmD, BCPS, [facility] Clinical Pharmacist [phone] Please check AMION for all [facility] Pharmacy numbers 06/21/2023

## 2023-06-21 NOTE — Progress Notes (Signed)
  Progress Note  Patient Name: Gregory Cole Date of Encounter: 06/21/2023 [facility] Cardiologist: Magnus Schuller, MD   Interval Summary   Patient with low level chest discomfort but muchimproved.    Vital Signs Vitals:   06/21/23 0530 06/21/23 0600 06/21/23 0630 06/21/23 0700  BP: (!) 90/51 (!) 103/57 (!) 102/49 102/69  Pulse: (!) 58 (!) 59 (!) 59 (!) 54  Resp: (!) 21 13 17 19   Temp:      TempSrc:      SpO2: 93% 95% 92% 96%  Weight:      Height:        Intake/Output Summary (Last 24 hours) at 06/21/2023 0746 Last data filed at 06/21/2023 0600 Gross per 24 hour  Intake 635.8 ml  Output 2025 ml  Net -1389.2 ml      06/19/2023    7:15 PM 06/19/2023    5:00 PM 09/05/2022   10:09 AM  Last 3 Weights  Weight (lbs) 185 lb 3 oz 185 lb 3 oz 176 lb  Weight (kg) 84 kg 84 kg 79.833 kg      Telemetry/ECG  SR - Personally Reviewed  Physical Exam  GEN: No acute distress.   Neck: No JVD Cardiac: RRR, no murmurs, rubs, or gallops.  Respiratory: Clear to auscultation bilaterally. GI: Soft, nontender, non-distended  MS: No edema: 2+ radials bilaterally  Assessment & Plan  ACS:  Dynamic inferior STE >> cath yesterday with Lambert Pillion 1:1:1 disease of RCA crux, patent RCA and ramus stents, occluded LAD -History of remote DES ramus, DES  mRCA 2021; inferior stemi DES pRCA 2022 -CMR 2022 with no viability of L apex with aneurysm; no HF or VT.   -Continue heparin , nitroglycerin  infusions -Coreg  12.5 mg BID -ASA 81 -Ticagrelor  90 bid -PRN morphine -PCI of RCA today 2.  LV apical aneurysm:    TTE 6/11 EF 30-35% with high risk for thrombus  -Coreg  12.5mg  BID  -Start Jardiance  10  -Will consider Entresto /ARB and spiroonolactone tomorrow  -Cardiac MR 2022 without thrombus  -Obtain CMR to evaluate for thrombus this admission.  If so, would convert to plavix  and start Eliquis 3.  HL:  LDL goal < 55  -Atorvastatin  80      For questions or updates, please contact [facility] Please consult [email or web address] for contact info under       Signed, Gredmarie Delange K Deyra Perdomo, MD

## 2023-06-21 NOTE — H&P (View-Only) (Signed)
  Progress Note  Patient Name: Gregory Cole Date of Encounter: 06/21/2023 [facility] Cardiologist: Magnus Schuller, MD   Interval Summary   Patient with low level chest discomfort but muchimproved.    Vital Signs Vitals:   06/21/23 0530 06/21/23 0600 06/21/23 0630 06/21/23 0700  BP: (!) 90/51 (!) 103/57 (!) 102/49 102/69  Pulse: (!) 58 (!) 59 (!) 59 (!) 54  Resp: (!) 21 13 17 19   Temp:      TempSrc:      SpO2: 93% 95% 92% 96%  Weight:      Height:        Intake/Output Summary (Last 24 hours) at 06/21/2023 0746 Last data filed at 06/21/2023 0600 Gross per 24 hour  Intake 635.8 ml  Output 2025 ml  Net -1389.2 ml      06/19/2023    7:15 PM 06/19/2023    5:00 PM 09/05/2022   10:09 AM  Last 3 Weights  Weight (lbs) 185 lb 3 oz 185 lb 3 oz 176 lb  Weight (kg) 84 kg 84 kg 79.833 kg      Telemetry/ECG  SR - Personally Reviewed  Physical Exam  GEN: No acute distress.   Neck: No JVD Cardiac: RRR, no murmurs, rubs, or gallops.  Respiratory: Clear to auscultation bilaterally. GI: Soft, nontender, non-distended  MS: No edema: 2+ radials bilaterally  Assessment & Plan  ACS:  Dynamic inferior STE >> cath yesterday with Lambert Pillion 1:1:1 disease of RCA crux, patent RCA and ramus stents, occluded LAD -History of remote DES ramus, DES  mRCA 2021; inferior stemi DES pRCA 2022 -CMR 2022 with no viability of L apex with aneurysm; no HF or VT.   -Continue heparin , nitroglycerin  infusions -Coreg  12.5 mg BID -ASA 81 -Ticagrelor  90 bid -PRN morphine -PCI of RCA today 2.  LV apical aneurysm:    TTE 6/11 EF 30-35% with high risk for thrombus  -Coreg  12.5mg  BID  -Start Jardiance  10  -Will consider Entresto /ARB and spiroonolactone tomorrow  -Cardiac MR 2022 without thrombus  -Obtain CMR to evaluate for thrombus this admission.  If so, would convert to plavix  and start Eliquis 3.  HL:  LDL goal < 55  -Atorvastatin  80      For questions or updates, please contact [facility] Please consult [email or web address] for contact info under       Signed, Gredmarie Delange K Deyra Perdomo, MD

## 2023-06-21 NOTE — Progress Notes (Signed)
 Heart Failure Nurse Navigator Progress Note  [facility] PCP-Cardiologist: Loetta Ringer Admission Diagnosis: STEMI Admitted from: Home via EMS  Presentation:   Gregory Cole presented with intermittent chest pain for 2 days, took 2 nitroglycerin  had some relief. Then had recurrent chest heaviness pain radiated to his back and down his left arm, episode of cold sweats. EKG showed ST elevations inferiorly, ST depressions in V2. Code STEMI called. BP 117/77, HR 75, troponin 211, taken for a emergent cardiac cath with [facility] 1:1:1 disease of RCA crux, patent RCA and ramus stents, occluded LAD .PCI of RCA on 06/21/2023.   Patient was educated on the sign and symptoms of heart failure, daily weights, when to call his doctor or go to the ED.  Diet/ fluid restrictions ( patient reports to loving salt and uses it heavily, and drinking more then 64 oz per day in sweet tea and water) . Continued education on taking all medications as prescribed, ( patient stated he has been forgetting to take his medication due to his stress level with additional family members living with him and causing severe issues in the home. Spoke about using a pill box and attending all medical appointments. Patient verbalized his understanding. A Referral was placed on 06/21/2023 for prior authorization to come to HF TOC due to the patient is a [place] patient. Explained the process and the location of the clinic.   ECHO/ LVEF: 30-35%   Clinical Course:  Past Medical History:  Diagnosis Date   Chronic systolic heart failure (HCC)    CKD (chronic kidney disease)    Coronary artery disease    Diabetes mellitus without complication (HCC)    GERD (gastroesophageal reflux disease)    Glaucoma    Hypercholesteremia    Hypertension    Lesion of lung    [facility]   Myocardial infarction (HCC)    x3   Postablative hypothyroidism 03/18/2020   Sleep apnea    unable to tolerate CPAP     Social History    Socioeconomic History   Marital status: Married    Spouse name: Thersia Flax   Number of children: 1   Years of education: Not on file   Highest education level: High school graduate  Occupational History   Occupation: retired    Comment: [employer]  Tobacco Use   Smoking status: Former    Current packs/day: 0.00    Average packs/day: 2.0 packs/day for 40.0 years (80.0 ttl pk-yrs)    Types: Cigarettes    Start date: 01/10/1968    Quit date: 01/10/2008    Years since quitting: 15.4   Smokeless tobacco: Never  Vaping Use   Vaping status: Never Used  Substance and Sexual Activity   Alcohol use: Yes    Comment: occasional beer   Drug use: No   Sexual activity: Not Currently  Other Topics Concern   Not on file  Social History Narrative   Lives with his wife (who has dementia) - first floor of apartment complex   Social Drivers of Health   Financial Resource Strain: Low Risk  (06/21/2023)   Overall Financial Resource Strain (CARDIA)    Difficulty of Paying Living Expenses: Not hard at all  Food Insecurity: No Food Insecurity (06/19/2023)   Hunger Vital Sign    Worried About Running Out of Food in the Last Year: Never true    Ran Out of Food in the Last Year: Never true  Transportation Needs: No Transportation Needs (  06/21/2023)   PRAPARE - Administrator, Civil Service (Medical): No    Lack of Transportation (Non-Medical): No  Physical Activity: Insufficiently Active (07/31/2022)   Exercise Vital Sign    Days of Exercise per Week: 3 days    Minutes of Exercise per Session: 30 min  Stress: No Stress Concern Present (07/31/2022)   [employer] of Occupational Health - Occupational Stress Questionnaire    Feeling of Stress : Not at all  Social Connections: Moderately Isolated (06/19/2023)   Social Connection and Isolation Panel    Frequency of Communication with Friends and Family: More than three times a week    Frequency of Social Gatherings with Friends and Family: More than  three times a week    Attends Religious Services: Never    Database administrator or Organizations: No    Attends Engineer, structural: Never    Marital Status: Married   Water engineer and Provision:  Detailed education and instructions provided on heart failure disease management including the following:  Signs and symptoms of Heart Failure When to call the physician Importance of daily weights Low sodium diet Fluid restriction Medication management Anticipated future follow-up appointments  Patient education given on each of the above topics.  Patient acknowledges understanding via teach back method and acceptance of all instructions.  Education Materials:  Living Better With Heart Failure Booklet, HF zone tool, & Daily Weight Tracker Tool.  Patient has scale at home: Yes Patient has pill box at home: Yes   High Risk Criteria for Readmission and/or Poor Patient Outcomes: Heart failure hospital admissions (last 6 months): 0  No Show rate: 10%  Difficult social situation: yes, currently. Multiple members of his family are living with him and per patient its a very stressful environment. He has asked for them to be moved out before he gets home from the hospital.  Demonstrates medication adherence: No, forgets to take them  Primary Language: English [facility])  Literacy level: Reading, writing, and comprehension.   Barriers of Care:   Medication compliance ( forgets to take them)   Family stressors Diet/ fluid restrictions ( loves salt, drinks more then 64 oz daily)     Considerations/Referrals:   Referral made to Heart Failure Pharmacist Stewardship: Yes Referral made to Heart Failure CSW/NCM TOC: NA Referral made to Heart & Vascular TOC clinic: [facility]. A referral was placed on 06/21/2023 for prior Auth to come for a HF TOC appointment.   Items for Follow-up on DC/TOC: Medication compliance Diet/ fluid restrictions/ daily weights Continued  HF education.    Randie Bustle, BSN, Scientist, clinical (histocompatibility and immunogenetics) Only

## 2023-06-21 NOTE — Progress Notes (Signed)
 ANTICOAGULATION CONSULT NOTE  Pharmacy Consult for heparin  Indication: chest pain/ACS  No Known Allergies  Patient Measurements: Height: 5' 7 (170.2 cm) Weight: 84 kg (185 lb 3 oz) IBW/kg (Calculated) : 66.1 Heparin  Dosing Weight: 84 kg   Vital Signs: Temp: 97.9 F (36.6 C) (06/12 1104) Temp Source: Oral (06/12 1104) BP: 137/78 (06/12 1500) Pulse Rate: 72 (06/12 1515)  Labs: Recent Labs    06/19/23 1713 06/19/23 1716 06/19/23 1916 06/20/23 0412 06/20/23 0811 06/20/23 1607 06/21/23 0306  HGB 15.2  --   --  14.5  --   --  14.9  HCT 46.4  --   --  44.2  --   --  44.7  PLT 189  --   --  177  --   --  181  APTT  --  37*  --   --   --   --   --   LABPROT  --  14.3  --   --   --   --   --   INR  --  1.1  --   --   --   --   --   HEPARINUNFRC  --   --   --   --  0.27* 0.44 0.45  CREATININE 1.18  --   --  1.07  --   --  1.23  TROPONINIHS 128*  --  211*  --   --   --   --     Estimated Creatinine Clearance: 54.6 mL/min (by C-G formula based on SCr of 1.23 mg/dL).  Medical History: Past Medical History:  Diagnosis Date   Chronic systolic heart failure (HCC)    CKD (chronic kidney disease)    Coronary artery disease    Diabetes mellitus without complication (HCC)    GERD (gastroesophageal reflux disease)    Glaucoma    Hypercholesteremia    Hypertension    Lesion of lung    [facility]   Myocardial infarction (HCC)    x3   Postablative hypothyroidism 03/18/2020   Sleep apnea    unable to tolerate CPAP    Medications:  See [facility]  Assessment: 73 yo male with hx STEMI s/p RCA stent x2 (2021, 2020) presents with chest pain found to have mvCAD, TCTS consulted for possible CABG.  Not on anticoagulation prior to admission.  Pharmacy consulted for heparin  dosing.  He is now s/p staged PCI and with suspected LV thrombus. Heparin  to restart 2 hours after TR band removal -heparin  was previously at goal on 1250 units/hr   Goal of Therapy:  Heparin   level 0.3-0.7 units/ml Monitor platelets by anticoagulation protocol: Yes   Plan:  Restart heparin  1250 units/h 2 hrs after the TR band is removed Daily heparin  level, CBC  Baxter Limber, PharmD Clinical Pharmacist **Pharmacist phone directory can now be found on [email or web address] ([email or web address]).  Listed under [facility].

## 2023-06-22 ENCOUNTER — Other Ambulatory Visit (HOSPITAL_COMMUNITY): Payer: Self-pay

## 2023-06-22 ENCOUNTER — Inpatient Hospital Stay (HOSPITAL_COMMUNITY)

## 2023-06-22 ENCOUNTER — Encounter (HOSPITAL_COMMUNITY): Payer: Self-pay | Admitting: Internal Medicine

## 2023-06-22 DIAGNOSIS — I351 Nonrheumatic aortic (valve) insufficiency: Secondary | ICD-10-CM | POA: Diagnosis not present

## 2023-06-22 LAB — BASIC METABOLIC PANEL WITH GFR
Anion gap: 9 (ref 5–15)
BUN: 24 mg/dL — ABNORMAL HIGH (ref 8–23)
CO2: 21 mmol/L — ABNORMAL LOW (ref 22–32)
Calcium: 8.3 mg/dL — ABNORMAL LOW (ref 8.9–10.3)
Chloride: 108 mmol/L (ref 98–111)
Creatinine, Ser: 1.39 mg/dL — ABNORMAL HIGH (ref 0.61–1.24)
GFR, Estimated: 53 mL/min — ABNORMAL LOW (ref >60)
Glucose, Bld: 150 mg/dL — ABNORMAL HIGH (ref 70–99)
Potassium: 3.8 mmol/L (ref 3.5–5.1)
Sodium: 138 mmol/L (ref 135–145)

## 2023-06-22 LAB — CBC
HCT: 44.3 % (ref 39.0–52.0)
Hemoglobin: 14.6 g/dL (ref 13.0–17.0)
MCH: 31.7 pg (ref 26.0–34.0)
MCHC: 33 g/dL (ref 30.0–36.0)
MCV: 96.1 fL (ref 80.0–100.0)
Platelets: 195 10*3/uL (ref 150–400)
RBC: 4.61 MIL/uL (ref 4.22–5.81)
RDW: 13.8 % (ref 11.5–15.5)
WBC: 7.7 10*3/uL (ref 4.0–10.5)
nRBC: 0 % (ref 0.0–0.2)

## 2023-06-22 LAB — HEPARIN LEVEL (UNFRACTIONATED): Heparin Unfractionated: 0.32 [IU]/mL (ref 0.30–0.70)

## 2023-06-22 MED ORDER — HEPARIN (PORCINE) 25000 UT/250ML-% IV SOLN
1250.0000 [IU]/h | INTRAVENOUS | Status: DC
  Administered 2023-06-22: 1250 [IU]/h via INTRAVENOUS
  Filled 2023-06-22: qty 250

## 2023-06-22 MED ORDER — EMPAGLIFLOZIN 10 MG PO TABS
10.0000 mg | ORAL_TABLET | Freq: Every day | ORAL | Status: DC
  Administered 2023-06-22 – 2023-06-23 (×2): 10 mg via ORAL
  Filled 2023-06-22 (×2): qty 1

## 2023-06-22 MED ORDER — GADOBUTROL 1 MMOL/ML IV SOLN
10.0000 mL | Freq: Once | INTRAVENOUS | Status: AC | PRN
Start: 2023-06-22 — End: 2023-06-22
  Administered 2023-06-22: 10 mL via INTRAVENOUS

## 2023-06-22 NOTE — Progress Notes (Signed)
 ANTICOAGULATION CONSULT NOTE  Pharmacy Consult for heparin  Indication: chest pain/ACS  No Known Allergies  Patient Measurements: Height: 5' 7 (170.2 cm) Weight: 84 kg (185 lb 3 oz) IBW/kg (Calculated) : 66.1 Heparin  Dosing Weight: 84 kg   Vital Signs: Temp: 98 F (36.7 C) (06/13 1130) Temp Source: Oral (06/13 1130) BP: 136/67 (06/13 0900) Pulse Rate: 59 (06/13 0900)  Labs: Recent Labs    06/19/23 1713 06/19/23 1716 06/19/23 1916 06/20/23 0412 06/20/23 0811 06/20/23 1607 06/21/23 0306 06/22/23 0429 06/22/23 1216  HGB 15.2  --   --  14.5  --   --  14.9 14.6  --   HCT 46.4  --   --  44.2  --   --  44.7 44.3  --   PLT 189  --   --  177  --   --  181 195  --   APTT  --  37*  --   --   --   --   --   --   --   LABPROT  --  14.3  --   --   --   --   --   --   --   INR  --  1.1  --   --   --   --   --   --   --   HEPARINUNFRC  --   --   --   --    < > 0.44 0.45  --  0.32  CREATININE 1.18  --   --  1.07  --   --  1.23 1.39*  --   TROPONINIHS 128*  --  211*  --   --   --   --   --   --    < > = values in this interval not displayed.    Estimated Creatinine Clearance: 48.3 mL/min (A) (by C-G formula based on SCr of 1.39 mg/dL (H)).  Medical History: Past Medical History:  Diagnosis Date   Chronic systolic heart failure (HCC)    CKD (chronic kidney disease)    Coronary artery disease    Diabetes mellitus without complication (HCC)    GERD (gastroesophageal reflux disease)    Glaucoma    Hypercholesteremia    Hypertension    Lesion of lung    [facility]   Myocardial infarction (HCC)    x3   Postablative hypothyroidism 03/18/2020   Sleep apnea    unable to tolerate CPAP    Medications:  See [facility]  Assessment: 73 yo male with hx STEMI s/p RCA stent x2 (2021, 2020) presents with chest pain found to have mvCAD, TCTS consulted for possible CABG.  Not on anticoagulation prior to admission.  Pharmacy consulted for heparin  dosing.  He is now s/p  staged PCI and with suspected LV thrombus.  Heparin  level therapeutic, no issues noted.  Goal of Therapy:  Heparin  level 0.3-0.7 units/ml Monitor platelets by anticoagulation protocol: Yes   Plan:  Continue heparin  IV 1250 units/hr Daily heparin  level, CBC F/u to DOAC  Cecillia Cogan, PharmD Clinical Pharmacist 06/22/2023  1:48 PM

## 2023-06-22 NOTE — TOC CM/SW Note (Signed)
..  06/22/2023  Gregory Cole DOB: [birth year] MRN: [id number]   RIDER WAIVER AND RELEASE OF LIABILITY  For the purposes of helping with transportation needs, [facility] partners with outside transportation providers (taxi companies, [place], Catering manager.) to give [facility] patients or other approved people the choice of on-demand rides Public librarian) to our buildings for non-emergency visits.  By using [employer], I, the person signing this document, on behalf of myself and/or any legal minors (in my care using the [employer]), agree:  Science writer given to me are supplied by independent, outside transportation providers who do not work for, or have any affiliation with, [employer]. [place] is not a transportation company. [facility] has no control over the quality or safety of the rides I get using [employer]. [facility] has no control over whether any outside ride will happen on time or not. [facility] gives no guarantee on the reliability, quality, safety, or availability on any rides, or that no mistakes will happen. I know and accept that traveling by vehicle (car, truck, SVU, Carloyn Chi, bus, taxi, etc.) has risks of serious injuries such as disability, being paralyzed, and death. I know and agree the risk of using [employer] is mine alone, and not [employer]. [employer] are provided as is and as are available. The transportation providers are in charge for all inspections and care of the vehicles used to provide these rides. I agree not to take legal action against [facility], its agents, employees, officers, directors, representatives, insurers, attorneys, assigns, successors, subsidiaries, and affiliates at any time for any reasons related directly or indirectly to using [employer]. I also agree not to take legal action against [facility] or its affiliates for any injury, death, or damage to property caused by or related to using  [employer]. I have read this Waiver and Release of Liability, and I understand the terms used in it and their legal meaning. This Waiver is freely and voluntarily given with the understanding that my right (or any legal minors) to legal action against [place] relating to [employer] is knowingly given up to use these services.   I attest that I read the Ride Waiver and Release of Liability to Gregory Cole, gave Mr. Dragoo the opportunity to ask questions and answered the questions asked (if any). I affirm that Gregory Cole then provided consent for assistance with transportation.

## 2023-06-22 NOTE — TOC Initial Note (Signed)
 Transition of Care [facility]) - Initial/Assessment Note    Patient Details  Name: Gregory Cole MRN: [id number] Date of Birth: [birth year]  [facility] of [facility]) CM/SW Contact:    Benjiman Bras, RN Phone Number: [phone] 06/22/2023, 4:35 PM  Clinical Narrative:                 TOC CM spoke to pt and states he care for his wife in the home. Independent pta. States he drives to his appts. Has a cane at home if needed.   Pt was provided a taxi voucher for home.    Expected Discharge Plan: Home/Self Care Barriers to Discharge: Continued Medical Work up   Patient Goals and CMS Choice Patient states their goals for this hospitalization and ongoing recovery are:: patient reports he is independent at home          Expected Discharge Plan and Services   Discharge Planning Services: CM Consult   Living arrangements for the past 2 months: Single Family Home                                      Prior Living Arrangements/Services Living arrangements for the past 2 months: Single Family Home Lives with:: Spouse Patient language and need for interpreter reviewed:: Yes Do you feel safe going back to the place where you live?: Yes      Need for Family Participation in Patient Care: No (Comment) Care giver support system in place?: No (comment) Current home services: DME (cane) Criminal Activity/Legal Involvement Pertinent to Current Situation/Hospitalization: No - Comment as needed  Activities of Daily Living   ADL Screening (condition at time of admission) Independently performs ADLs?: Yes (appropriate for developmental age) Is the patient deaf or have difficulty hearing?: Yes Does the patient have difficulty seeing, even when wearing glasses/contacts?: No Does the patient have difficulty concentrating, remembering, or making decisions?: No  Permission Sought/Granted Permission sought to share information with : Case Manager, PCP Permission granted to share  information with : Yes, Verbal Permission Granted              Emotional Assessment Appearance:: Appears stated age Attitude/Demeanor/Rapport: Engaged Affect (typically observed): Accepting Orientation: : Oriented to Self, Oriented to Place, Oriented to  Time, Oriented to Situation   Psych Involvement: No (comment)  Admission diagnosis:  ST elevation myocardial infarction (STEMI), unspecified artery (HCC) [I21.3] Acute ST elevation myocardial infarction (STEMI) of inferior wall (HCC) [I21.19] Patient Active Problem List   Diagnosis Date Noted   Acute ST elevation myocardial infarction (STEMI) of inferior wall (HCC) 06/19/2023   Iron deficiency anemia, unspecified 07/04/2022   Unilateral primary osteoarthritis, right knee 05/17/2021   Hyperlipidemia associated with type 2 diabetes mellitus (HCC) 07/21/2020   Ischemic cardiomyopathy -> in setting of inferior STEMI, LV gram EF of 35 to 40% inferior wall hypokinesis.  LVEDP 26. 07/21/2020   Presence of drug coated stent in right coronary artery -> second overlapping DES stent proximal to previous stent 07/21/2020   Acute ST elevation myocardial infarction (STEMI) of inferior wall involving right ventricle (HCC) 07/20/2020   Postablative hypothyroidism 03/18/2020   Arthritis 03/18/2020   GERD (gastroesophageal reflux disease)    Type 2 diabetes mellitus with complication, without long-term current use of insulin  (HCC) 01/21/2010   Hypertension associated with diabetes (HCC) 01/21/2010   History of non-ST elevation myocardial infarction (NSTEMI) 01/21/2010   Coronary  artery disease involving native heart with unstable angina pectoris (HCC) 01/21/2010   Tobacco use 01/21/2010   [facility]   [id number] [place], [place] - [address] [place] [address] Phone: [phone] Fax: [phone]  [facility] [address] [place] [address] Phone: [phone] Fax: [phone]  [facility] - [place], [place] - [address] [place] [address] Phone: [phone] Fax: [phone]     Social Drivers of Health (SDOH) Social History: SDOH Screenings   Food Insecurity: No Food Insecurity (06/19/2023)  Housing: Unknown (06/21/2023)  Transportation Needs: No Transportation Needs (06/21/2023)  Utilities: Not At Risk (06/19/2023)  Alcohol Screen: Low Risk  (06/21/2023)  Depression (PHQ2-9): Low Risk  (09/05/2022)  Financial Resource Strain: Low Risk  (06/21/2023)  Physical Activity: Insufficiently Active (07/31/2022)  Social Connections: Moderately Isolated (06/19/2023)  Stress: No Stress Concern Present (07/31/2022)  Tobacco Use: Medium Risk (06/19/2023)  Health Literacy: Adequate Health Literacy (07/31/2022)   SDOH Interventions: Housing Interventions: Intervention Not Indicated Transportation Interventions: Intervention Not Indicated Alcohol Usage Interventions: Intervention Not Indicated (Score <7) Financial Strain Interventions: Intervention Not Indicated   Readmission Risk Interventions     No data to display

## 2023-06-22 NOTE — Progress Notes (Signed)
  Progress Note  Patient Name: Gregory Cole Date of Encounter: 06/22/2023 [facility] Cardiologist: Magnus Schuller, MD   Interval Summary   S/P bifurcation stenting of dRCA/PDA/RPLV with culotte technique; LVEDP  Patient feels well, denies chest pain/dyspnea    Vital Signs Vitals:   06/22/23 0530 06/22/23 0600 06/22/23 0630 06/22/23 0700  BP: 116/71 112/66 123/75 (!) 112/58  Pulse: 60 (!) 55 (!) 56 60  Resp: (!) 22 20 19    Temp:      TempSrc:      SpO2: 94% 94% 93% 93%  Weight:      Height:        Intake/Output Summary (Last 24 hours) at 06/22/2023 0729 Last data filed at 06/22/2023 0600 Gross per 24 hour  Intake 665.26 ml  Output 1050 ml  Net -384.74 ml      06/19/2023    7:15 PM 06/19/2023    5:00 PM 09/05/2022   10:09 AM  Last 3 Weights  Weight (lbs) 185 lb 3 oz 185 lb 3 oz 176 lb  Weight (kg) 84 kg 84 kg 79.833 kg      Telemetry/ECG  SR occ PVCs - Personally Reviewed EKG SB with inf + ant Q waves, PVCs  Physical Exam  GEN: No acute distress.   Neck: No JVD Cardiac: RRR, no murmurs, rubs, or gallops.  Respiratory: Clear to auscultation bilaterally. GI: Soft, nontender, non-distended  MS: No edema: 2+ radials bilaterally  Assessment & Plan  ACS:  Dynamic inferior STE >> cath this admission with [facility] 1:1:1 disease of RCA crux, patent RCA and ramus stents, occluded LAD -History of remote DES ramus, DES  mRCA 2021; inferior stemi DES pRCA 2022 -s/p culotte PCI dRCA/PDA/RPLV 6/12 -CMR 2022 with no viability of L apex with aneurysm; no HF or VT.   -Coreg  12.5 mg BID -ASA 81 -Prasugrel  10 2.  ICM with LV apical aneurysm:    TTE 6/11 EF 30-35% with high risk for thrombus  -Coreg  12.5mg  BID  -Start Jardiance  10  -Will consider Entresto /ARB +/-  spiroonolactone tomorrow if Cr improved  -Cardiac MR 2022 without thrombus  -Obtain CMR to evaluate for thrombus this admission.  If so, would convert to plavix  and start Eliquis 3.  HL:  LDL goal  < 55  -Atorvastatin  80 4.  AKI:  Cr uptrending  -Consider ARB vs Entresto  tomorrow, spironolactone  Transfer out of unit today      For questions or updates, please contact [facility] Please consult [email or web address] for contact info under       Signed, Gregory Careaga K Duvall Comes, MD

## 2023-06-22 NOTE — Progress Notes (Signed)
 CARDIAC REHAB PHASE I   PRE:  Rate/Rhythm: 63 SR    BP: sitting 142/73    SpO2: 96 RA  MODE:  Ambulation: 370 ft   POST:  Rate/Rhythm: 73 SR    BP: sitting 141/73     SpO2: 94 RA  Pt ambulated without c/o, VSS. To recliner.   Discussed with pt MI, stent, restrictions, importance of Plavix  and NTG. He voiced he cares for his wife with severe dementia therefore it is difficult for him to watch his diet and do formal exercise. Reviewed HF management and daily wts. Will refer to [place] [facility] however pt will not be able to leave his wife.    Notified RN of pt's worry over his wife and lack of care at home. She will contact LSW or CM to look for further resources.  [phone]  German Koller BS, ACSM-CEP 06/22/2023 10:38 AM

## 2023-06-23 ENCOUNTER — Other Ambulatory Visit (HOSPITAL_COMMUNITY): Payer: Self-pay

## 2023-06-23 DIAGNOSIS — I2119 ST elevation (STEMI) myocardial infarction involving other coronary artery of inferior wall: Secondary | ICD-10-CM

## 2023-06-23 LAB — BASIC METABOLIC PANEL WITH GFR
Anion gap: 9 (ref 5–15)
BUN: 21 mg/dL (ref 8–23)
CO2: 25 mmol/L (ref 22–32)
Calcium: 8.9 mg/dL (ref 8.9–10.3)
Chloride: 106 mmol/L (ref 98–111)
Creatinine, Ser: 1.16 mg/dL (ref 0.61–1.24)
GFR, Estimated: >60 mL/min (ref >60)
Glucose, Bld: 150 mg/dL — ABNORMAL HIGH (ref 70–99)
Potassium: 3.7 mmol/L (ref 3.5–5.1)
Sodium: 140 mmol/L (ref 135–145)

## 2023-06-23 LAB — CBC
HCT: 44.2 % (ref 39.0–52.0)
Hemoglobin: 14.9 g/dL (ref 13.0–17.0)
MCH: 32 pg (ref 26.0–34.0)
MCHC: 33.7 g/dL (ref 30.0–36.0)
MCV: 94.8 fL (ref 80.0–100.0)
Platelets: 193 10*3/uL (ref 150–400)
RBC: 4.66 MIL/uL (ref 4.22–5.81)
RDW: 13.6 % (ref 11.5–15.5)
WBC: 7.3 10*3/uL (ref 4.0–10.5)
nRBC: 0 % (ref 0.0–0.2)

## 2023-06-23 MED ORDER — NITROGLYCERIN 0.4 MG SL SUBL
0.4000 mg | SUBLINGUAL_TABLET | SUBLINGUAL | 12 refills | Status: AC | PRN
Start: 2023-06-23 — End: ?
  Filled 2023-06-23: qty 25, 1d supply, fill #0

## 2023-06-23 MED ORDER — EMPAGLIFLOZIN 10 MG PO TABS
10.0000 mg | ORAL_TABLET | Freq: Every day | ORAL | 11 refills | Status: DC
Start: 2023-06-24 — End: 2023-07-24
  Filled 2023-06-23: qty 30, 30d supply, fill #0
  Filled 2023-07-23: qty 30, 30d supply, fill #1

## 2023-06-23 MED ORDER — ATORVASTATIN CALCIUM 80 MG PO TABS
80.0000 mg | ORAL_TABLET | Freq: Every day | ORAL | 11 refills | Status: AC
  Filled 2023-06-23: qty 30, 30d supply, fill #0

## 2023-06-23 MED ORDER — PRASUGREL HCL 10 MG PO TABS
10.0000 mg | ORAL_TABLET | Freq: Every day | ORAL | 11 refills | Status: DC
  Filled 2023-06-23: qty 30, 30d supply, fill #0

## 2023-06-23 NOTE — Progress Notes (Signed)
 Patient [facility] that RNCM call [facility] to let [facility] know [facility] has been in the [facility].  [facility] called and was told patient has to be the one to call, [phone].  Patient given phone number-written down for him on a piece of paper.

## 2023-06-23 NOTE — Plan of Care (Signed)
 Patient is discharged to home. PIV removed. Personal belongings returned. DC instruction provided.  Problem: Education: Goal: Knowledge of General Education information will improve Description: Including pain rating scale, medication(s)/side effects and non-pharmacologic comfort measures Outcome: Completed/Met   Problem: Health Behavior/Discharge Planning: Goal: Ability to manage health-related needs will improve Outcome: Completed/Met   Problem: Clinical Measurements: Goal: Ability to maintain clinical measurements within normal limits will improve Outcome: Completed/Met Goal: Will remain free from infection Outcome: Completed/Met Goal: Diagnostic test results will improve Outcome: Completed/Met Goal: Respiratory complications will improve Outcome: Completed/Met Goal: Cardiovascular complication will be avoided Outcome: Completed/Met   Problem: Activity: Goal: Risk for activity intolerance will decrease Outcome: Completed/Met   Problem: Nutrition: Goal: Adequate nutrition will be maintained Outcome: Completed/Met   Problem: Coping: Goal: Level of anxiety will decrease Outcome: Completed/Met   Problem: Elimination: Goal: Will not experience complications related to bowel motility Outcome: Completed/Met Goal: Will not experience complications related to urinary retention Outcome: Completed/Met   Problem: Pain Managment: Goal: General experience of comfort will improve and/or be controlled Outcome: Completed/Met   Problem: Safety: Goal: Ability to remain free from injury will improve Outcome: Completed/Met   Problem: Skin Integrity: Goal: Risk for impaired skin integrity will decrease Outcome: Completed/Met   Problem: Education: Goal: Understanding of CV disease, CV risk reduction, and recovery process will improve Outcome: Completed/Met Goal: Individualized Educational Video(s) Outcome: Completed/Met   Problem: Activity: Goal: Ability to return to baseline  activity level will improve Outcome: Completed/Met   Problem: Cardiovascular: Goal: Ability to achieve and maintain adequate cardiovascular perfusion will improve Outcome: Completed/Met Goal: Vascular access site(s) Level 0-1 will be maintained Outcome: Completed/Met   Problem: Health Behavior/Discharge Planning: Goal: Ability to safely manage health-related needs after discharge will improve Outcome: Completed/Met

## 2023-06-23 NOTE — Progress Notes (Signed)
 DISCHARGE NOTE HOME Aramis Weil Quinnell to be discharged Home per MD order. Discussed prescriptions and follow up appointments with the patient. Prescriptions given to patient; medication list explained in detail. Patient verbalized understanding.  Skin clean, dry and intact without evidence of skin break down, no evidence of skin tears noted. IV catheter discontinued intact. Site without signs and symptoms of complications. Dressing and pressure applied. Pt denies pain at the site currently. No complaints noted.  Patient free of lines, drains, and wounds.   An After Visit Summary (AVS) was printed and given to the patient. Taken to Discharge lounge to wait for meds and taxi Patient escorted via wheelchair, and discharged home via taxi. Tonda Francisco, RN

## 2023-06-23 NOTE — TOC Progression Note (Signed)
 Transition of Care [facility]) - Initial/Assessment Note    Patient Details  Name: Gregory Cole MRN: [id number] Date of Birth: [birth year]  [facility]) CM/SW Contact:    Maya Sparrow, LCSW Phone Number: 06/23/2023, 11:36 AM  Clinical Narrative:                 CSW met with pt at bedside after receiving consult for pt discharging and needing transportation home. Pt already has a taxi voucher.  RN notified to call taxi whenever pt is ready to leave.  [facility]  CSW informed RNCM of request.   TOC will sign off as pt is discharging and services are no longer needed.    Expected Discharge Plan: Home/Self Care Barriers to Discharge: Continued Medical Work up   Patient Goals and CMS Choice Patient states their goals for this hospitalization and ongoing recovery are:: patient reports he is independent at home          Expected Discharge Plan and Services   Discharge Planning Services: CM Consult   Living arrangements for the past 2 months: Single Family Home Expected Discharge Date: 06/23/23                                    Prior Living Arrangements/Services Living arrangements for the past 2 months: Single Family Home Lives with:: Spouse Patient language and need for interpreter reviewed:: Yes Do you feel safe going back to the place where you live?: Yes      Need for Family Participation in Patient Care: No (Comment) Care giver support system in place?: No (comment) Current home services: DME (cane) Criminal Activity/Legal Involvement Pertinent to Current Situation/Hospitalization: No - Comment as needed  Activities of Daily Living   ADL Screening (condition at time of admission) Independently performs ADLs?: Yes (appropriate for developmental age) Is the patient deaf or have difficulty hearing?: Yes Does the patient have difficulty seeing, even when wearing glasses/contacts?: No Does the patient have difficulty  concentrating, remembering, or making decisions?: No  Permission Sought/Granted Permission sought to share information with : Case Manager, PCP Permission granted to share information with : Yes, Verbal Permission Granted              Emotional Assessment Appearance:: Appears stated age Attitude/Demeanor/Rapport: Engaged Affect (typically observed): Accepting Orientation: : Oriented to Self, Oriented to Place, Oriented to  Time, Oriented to Situation   Psych Involvement: No (comment)  Admission diagnosis:  ST elevation myocardial infarction (STEMI), unspecified artery (HCC) [I21.3] Acute ST elevation myocardial infarction (STEMI) of inferior wall (HCC) [I21.19] Patient Active Problem List   Diagnosis Date Noted   Acute ST elevation myocardial infarction (STEMI) of inferior wall (HCC) 06/19/2023   Iron deficiency anemia, unspecified 07/04/2022   Unilateral primary osteoarthritis, right knee 05/17/2021   Hyperlipidemia associated with type 2 diabetes mellitus (HCC) 07/21/2020   Ischemic cardiomyopathy -> in setting of inferior STEMI, LV gram EF of 35 to 40% inferior wall hypokinesis.  LVEDP 26. 07/21/2020   Presence of drug coated stent in right coronary artery -> second overlapping DES stent proximal to previous stent 07/21/2020   Acute ST elevation myocardial infarction (STEMI) of inferior wall involving right ventricle (HCC) 07/20/2020   Postablative hypothyroidism 03/18/2020   Arthritis 03/18/2020   GERD (gastroesophageal reflux disease)    Type 2 diabetes mellitus with complication, without long-term current use of insulin  (  HCC) 01/21/2010   Hypertension associated with diabetes (HCC) 01/21/2010   History of non-ST elevation myocardial infarction (NSTEMI) 01/21/2010   Coronary artery disease involving native heart with unstable angina pectoris (HCC) 01/21/2010   Tobacco use 01/21/2010   PCP:  [facility]   [id number] - [place],  - [address] [facility] [address] [place] [address] Phone: [phone] Fax: [phone]  [facility] [address] [place] [address] Phone: [phone] Fax: [phone]  [facility] - [place], [place] - [address] [place] [address] Phone: [phone] Fax: [phone]     Social Drivers of Health (SDOH) Social History: SDOH Screenings   Food Insecurity: No Food Insecurity (06/19/2023)  Housing: Unknown (06/21/2023)  Transportation Needs: No Transportation Needs (06/21/2023)  Utilities: Not At Risk (06/19/2023)  Alcohol Screen: Low Risk  (06/21/2023)  Depression (PHQ2-9): Low Risk  (09/05/2022)  Financial Resource Strain: Low Risk  (06/21/2023)  Physical Activity: Insufficiently Active (07/31/2022)  Social Connections: Moderately Isolated (06/19/2023)  Stress: No Stress Concern Present (07/31/2022)  Tobacco Use: Medium Risk (06/19/2023)  Health Literacy: Adequate Health Literacy (07/31/2022)   SDOH Interventions: Housing Interventions: Intervention Not Indicated Transportation Interventions: Taxi Voucher Given Alcohol Usage Interventions: Intervention Not Indicated (Score <7) Financial Strain Interventions: Intervention Not Indicated   Readmission Risk Interventions     No data to display

## 2023-06-23 NOTE — Discharge Summary (Signed)
 Discharge Summary   Patient ID: Gregory Cole MRN: [id number]; DOB: 1950/05/25  Admit date: 06/19/2023 Discharge date: 06/23/2023  PCP:  [name] Providers Cardiologist:  Gregory Schuller, MD  Cardiology APP:  Gregory Cole, [place]      Discharge Diagnoses  Principal Problem:   Acute ST elevation myocardial infarction (STEMI) of inferior wall [facility])   Diagnostic Studies/Procedures   CARDIAC CATH: 06/19/2023   Mid LAD to Dist LAD lesion is 100% stenosed.   Mid LAD lesion is 20% stenosed.   RPDA-2 lesion is 50% stenosed.   2nd RPL lesion is 50% stenosed.   RPDA-1 lesion is 99% stenosed.   RPAV lesion is 99% stenosed.   Ost Cx to Prox Cx lesion is 30% stenosed.   Ramus lesion is 20% stenosed.   Prox LAD lesion is 80% stenosed.   Dist RCA lesion is 80% stenosed.   Chronic LAD occlusion. The distal LAD fills from left to left and right to left collaterals The Circumflex has mild plaque. The intermediate branch stent is patent The RCA is a dominant vessel with patent proximal and mid stents. The distal RCA has a severe stenosis. The PDA has a severe ostial stenosis. The posterolateral artery has a severe ostial stenosis.  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  LVEDP 19 mmHg   Recommendations: He is chest pain free. He has complex disease in the distal RCA involving both the large Posterolateral artery and the large PDA. He has chronic occlusion of the mid LAD. Will review films with the IC team tomorrow. Consider complex PCI vs CABG. Will start IV heparin  and IV NTG tonight. High intensity statin. Continue ASA. Hold PLavix . Echo tomorrow.  Diagnostic Dominance: Right   ECHO: 06/20/2022  1. Echo contrast swirl in apex concerning for possible thrombus; this appears similar to contrast images from 2022, when thrombus was then excluded by MRI. Left ventricular ejection fraction, by estimation, is 30 to 35%. The left ventricle has moderately  decreased function. The left ventricle demonstrates regional  wall motion abnormalities (see scoring diagram/findings for description). Left ventricular diastolic parameters are consistent with Grade I diastolic dysfunction (impaired relaxation).   2. Right ventricular systolic function is low normal. The right  ventricular size is mildly enlarged. Tricuspid regurgitation signal is  inadequate for assessing PA pressure.   3. The mitral valve is normal in structure. Trivial mitral valve  regurgitation. No evidence of mitral stenosis.   4. The aortic valve was not well visualized. Aortic valve regurgitation is not visualized. No aortic stenosis is present.   5. The inferior vena cava is dilated in size with <50% respiratory  variability, suggesting right atrial pressure of 15 mmHg.   Comparison(s): No significant change from prior study. Prior images  reviewed side by side.   Conclusion(s)/Recommendation(s): Overall similar to images from 2022; LV apical aneurysm present previously. Both images from 2022 and this study show significant swirling at apex, concerning for possible LV thrombus. Cardiac MRI excluded thrombus in  2022; while aneursym is not new, would consider cardiac MRI for definitive evaluation if there is no other indication for long term anticoagulation.   PCI: 06/21/2023 Conclusions: Severe distal RCA bifurcation stenoses ([place] 1, 1, 1), as seen on diagnostic catheterization earlier this week. Widely patent proximal-mid RCA stent. Successful bifurcation PCI of distal RCA extending into the RPDA and RPAV using Culotte technique with 0% residual stenosis and TIMI-3 flow.   Recommendations: Dual antiplatelet therapy with aspirin   and prasugrel  for at least 12 months (may need to transition to clopidogrel  when the patient turns 72 years old later this year). Resume heparin  infusion 2 hours after TR band removal given concern for LV apical thrombus pending cardiac MRI. Continue  aggressive secondary prevention of coronary artery disease and optimization of goal-directed medical therapy for chronic HFrEF.  Diagnostic Dominance: Right  Intervention     CARDIAC MRI: 06/22/2023 IMPRESSION: 1. 4 x 4 cm LV apical aneurysm with akinesis and transmural delayed myocardial enhancement. There is no discrete LV apical thrombus. Slow flow noted at apex on cine images. 2. Moderately reduced LV systolic function with grossly normal LV chamber size. LVEF 34%. Inferior wall delayed myocardial enhancement consistent with inferior infarct.  3. Mildly reduced RV systolic function with normal RV chamber size. RVEF 49%. _____________   History of Present Illness   Gregory Cole is a 73 y.o. male with history of CKD, CAD, DM, GERD, sleep apnea, hypothyroidism, HTN and HLD admitted who presented to the [name] ED with c/o chest pain.   EKG with inferior Q waves and inferior ST elevation.   Pt with ongoing pain in the ED. He reports chest pain since yesterday. He has been taking ASA and Plavix . He has a history of CAD with stents in the intermediate branch and RCA. Last cath in 2022 in setting of inferior STEMI secondary to proximal RCA occlusion.   He was taken emergently to the Cath Lab.  Hospital Course   Consultants: None  He went to the Cath Lab on 6/10 and had a diagnostic cath.  Upon reviewing the films, Dr. Gibson Cole felt that the best option was to review the patient with the interventional team and decide on complex PCI of the RCA versus CABG.  He was pain-free and remained so on medical therapy.  He had an echo which showed concerns for an LV thrombus as well as a decreased EF.  Cardiac MRI was recommended.  The cardiac MRI was performed on 6/13, results are above.  He does not have an LV thrombus, but slow flow was noted at the apex on cine images.  After reviewing the films with the interventional team, the plan was made to do a complex PCI.  This was performed on 6/12.   He had bifurcation stenting of dRCA/PDA/RPLV with culotte technique; LVEDP .  He tolerated the procedure well.  On 6/14, he was seen by Dr. Marven Slimmer and all data were reviewed.  He cares for his wife who has significant dementia, and therefore will not be able to do cardiac rehab as an outpatient.  He was hemodynamically and electrically stable.  He will need outpatient reassessment of LV function after 45 days to determine need for ICD.  He will need a TOC appointment and the plan will be to start Entresto  or an ARB as an outpatient.  His blood pressure has been low intermittently.  He is to monitor this at home.  He is to continue aspirin , prasugrel , Coreg  and statin.  He had stopped taking Lipitor  80 mg daily prior to admission because the [place] told him he did not need it.  It was restarted.  He also requested and was given a new prescription for nitroglycerin .    He is a patient at the [place] and he was told that he needs to arrange a follow-up appointment in 10-14 days.  He is agreeable to this.  No further inpatient workup is indicated and he is considered stable for  discharge, to follow-up as an outpatient.     Did the patient have an acute coronary syndrome (MI, NSTEMI, STEMI, etc) this admission?:  Yes                               AHA/ACC ACS Clinical Performance & Quality Measures: Aspirin  prescribed? - Yes ADP Receptor Inhibitor (Plavix /Clopidogrel , Brilinta /Ticagrelor  or Effient /Prasugrel ) prescribed (includes medically managed patients)? - Yes Beta Blocker prescribed? - Yes High Intensity Statin (Lipitor  40-80mg  or Crestor 20-40mg ) prescribed? - Yes EF assessed during THIS hospitalization? - Yes For EF <40%, was ACEI/ARB prescribed? - No - Reason:  BP too low For EF <40%, Aldosterone Antagonist (Spironolactone or Eplerenone) prescribed? - No - Reason:  BP too low Cardiac Rehab Phase II ordered (including medically managed patients)? - Yes    _____________  Discharge  Vitals Blood pressure (!) 145/74, pulse 72, temperature 98 F (36.7 C), temperature source Oral, resp. rate 18, height 5' 7 (1.702 m), weight 84 kg, SpO2 97%.  Filed Weights   06/19/23 1700 06/19/23 1915  Weight: 84 kg 84 kg    Labs & Radiologic Studies  CBC Recent Labs    06/22/23 0429 06/23/23 0420  WBC 7.7 7.3  HGB 14.6 14.9  HCT 44.3 44.2  MCV 96.1 94.8  PLT 195 193   Basic Metabolic Panel Recent Labs    52/84/13 0306 06/22/23 0429 06/23/23 0420  NA 137 138 140  K 4.0 3.8 3.7  CL 105 108 106  CO2 25 21* 25  GLUCOSE 119* 150* 150*  BUN 17 24* 21  CREATININE 1.23 1.39* 1.16  CALCIUM  8.6* 8.3* 8.9  MG 2.3  --   --    Liver Function Tests No results for input(s): AST, ALT, ALKPHOS, BILITOT, PROT, ALBUMIN in the last 72 hours. No results for input(s): LIPASE, AMYLASE in the last 72 hours. High Sensitivity Troponin:   Recent Labs  Lab 06/19/23 1713 06/19/23 1916  TROPONINIHS 128* 211*    No results for input(s): TRNPT in the last 720 hours.  BNP Invalid input(s): POCBNP No results for input(s): PROBNP in the last 72 hours.  No results for input(s): BNP in the last 72 hours.  D-Dimer No results for input(s): DDIMER in the last 72 hours. Hemoglobin A1C No results for input(s): HGBA1C in the last 72 hours. Fasting Lipid Panel No results for input(s): CHOL, HDL, LDLCALC, TRIG, CHOLHDL, LDLDIRECT in the last 72 hours. No results found for: LIPOA  Thyroid  Function Tests No results for input(s): TSH, T4TOTAL, T3FREE, THYROIDAB in the last 72 hours.  Invalid input(s): FREET3 _____________  MR CARDIAC MORPHOLOGY W WO CONTRAST Result Date: 06/22/2023 CLINICAL DATA:  Cardiac thrombus or embolic source suspected possible LV thrombus COMPARISON: Echocardiogram 06/20/23 EXAM: MR CARDIA MORPHOLOGY WITHOUT AND WITH CONTRAST; MR CARDIAC VELOCITY FLOW MAPPING TECHNIQUE: The patient was scanned on a 1.5 Tesla Siemens  magnet. A dedicated cardiac coil was used. Functional imaging was done using TrueFisp sequences. 2,3, and 4 chamber views were done to assess for RWMA's. Modified Simpson's rule using a short axis stack was used to calculate an ejection fraction on a dedicated work [employer]. The patient received 10mL GADAVIST  GADOBUTROL  1 MMOL/ML IV SOLN. After 10 minutes inversion recovery sequences were used to assess for infiltration and scar tissue. Phase contrast velocity encoded images obtained x 2. This examination is tailored for evaluation cardiac anatomy and function and provides very limited assessment  of noncardiac structures, which are accordingly not evaluated during interpretation. If there is clinical concern for extracardiac pathology, further evaluation with CT imaging should be considered. FINDINGS: LEFT VENTRICLE: Left ventricular chamber size: Normal. Left ventricular wall thickness: Mildly increased. Maximal wall thickness 13 mm. Left ventricular systolic function: Moderately reduced LVEF = 34% Apical akinesis. There is no discrete LV apical thrombus on long TI images. Slow flow noted at apex. No myocardial edema Abnormal first pass perfusion at apex. There is post contrast delayed myocardial enhancement: Inferior wall LGE which is transmural at the base and mid. At the mid to apex, the LGE in the inferior wall is 50% of the myocardial thickness. With 7 days of MI, LGE may be overestimated for the assessment of viability. There is circumferential LGE with no viability at the apex (>50% myocardial thickness), extending into an LV apical aneurysm with transmural LGE. Normal T1 myocardial nulling kinetics suggest against a diagnosis of cardiac amyloidosis. ECV = 30% in areas of normal myocardium. RIGHT VENTRICLE: Normal right ventricular chamber size. Normal right ventricular wall thickness. Mildly reduced right ventricular systolic function. RVEF = 49% Mild hypokinesis. No post contrast  delayed myocardial enhancement. ATRIA: Left atrium: Mildly dilated Right atrium: normal PERICARDIUM: Normal pericardium.  Trivial pericardial effusion. OTHER: No significant extracardiac findings. MEASUREMENTS: Qp/Qs: 1.2 VALVES: Tricuspid aortic valve. Aortic valve regurgitation: Mild, regurgitant fraction 8% Pulmonary valve regurgitation: Trivial, regurgitant fraction 3% Mitral valve regurgitation: Trivial, regurgitant fraction 7% Tricuspid valve regurgitation: Trivial, regurgitant fraction 2% Left ventricle: LV male LV EF: 34% (normal 49-79%) Absolute volumes: LV EDV: (Normal 95-215 mL) LV ESV: 78mL (Normal 25-85 mL) LV SV: 40mL (Normal 61-145 mL) CO: 2.3L/min (Normal 3.4-7.8 L/min) Indexed volumes: CI: 1.15L/min/sq-m (Normal 1.8-4.2 L/min/sq-m) LV EDV: 67mL/sq-m (Normal 50-108 mL/sq-m) LV ESV: 74mL/sq-m (Normal 11-47 mL/sq-m) LV SV: 32mL/sq-m (Normal 33-72 mL/sq-m) Right ventricle: RV male RV EF:  49% (Normal 51-80%) Absolute volumes: RV EDV: 91mL (Normal 109-217 mL) RV ESV: 47mL (Normal 23-91 mL) RV SV: 45mL (Normal 71-141 mL) CO: 2.6L/min (Normal 2.8-8.8 L/min) Indexed volumes: CI: 1.28L/min/sq-m (Normal 1.7-4.2 L/min/sq-m) RV EDV: 24mL/sq-m (Normal 58-109 mL/sq-m) RV ESV: 88mL/sq-m (Normal 12-46 mL/sq-m) RV SV: 18mL/sq-m (Normal 38-71 mL/sq-m) IMPRESSION: 1. 4 x 4 cm LV apical aneurysm with akinesis and transmural delayed myocardial enhancement. There is no discrete LV apical thrombus. Slow flow noted at apex on cine images. 2. Moderately reduced LV systolic function with grossly normal LV chamber size. LVEF 34%. Inferior wall delayed myocardial enhancement consistent with inferior infarct. 3. Mildly reduced RV systolic function with normal RV chamber size. RVEF 49%. Electronically Signed   By: Grady Lawman M.D.   On: 06/22/2023 16:49   MR CARDIAC VELOCITY FLOW MAP Result Date: 06/22/2023 CLINICAL DATA:  Cardiac thrombus or embolic source suspected possible LV thrombus COMPARISON: Echocardiogram  06/20/23 EXAM: MR CARDIA MORPHOLOGY WITHOUT AND WITH CONTRAST; MR CARDIAC VELOCITY FLOW MAPPING TECHNIQUE: The patient was scanned on a 1.5 Tesla Siemens magnet. A dedicated cardiac coil was used. Functional imaging was done using TrueFisp sequences. 2,3, and 4 chamber views were done to assess for RWMA's. Modified Simpson's rule using a short axis stack was used to calculate an ejection fraction on a dedicated work [employer]. The patient received 10mL GADAVIST  GADOBUTROL  1 MMOL/ML IV SOLN. After 10 minutes inversion recovery sequences were used to assess for infiltration and scar tissue. Phase contrast velocity encoded images obtained x 2. This examination is tailored for evaluation cardiac anatomy and function and provides very  limited assessment of noncardiac structures, which are accordingly not evaluated during interpretation. If there is clinical concern for extracardiac pathology, further evaluation with CT imaging should be considered. FINDINGS: LEFT VENTRICLE: Left ventricular chamber size: Normal. Left ventricular wall thickness: Mildly increased. Maximal wall thickness 13 mm. Left ventricular systolic function: Moderately reduced LVEF = 34% Apical akinesis. There is no discrete LV apical thrombus on long TI images. Slow flow noted at apex. No myocardial edema Abnormal first pass perfusion at apex. There is post contrast delayed myocardial enhancement: Inferior wall LGE which is transmural at the base and mid. At the mid to apex, the LGE in the inferior wall is 50% of the myocardial thickness. With 7 days of MI, LGE may be overestimated for the assessment of viability. There is circumferential LGE with no viability at the apex (>50% myocardial thickness), extending into an LV apical aneurysm with transmural LGE. Normal T1 myocardial nulling kinetics suggest against a diagnosis of cardiac amyloidosis. ECV = 30% in areas of normal myocardium. RIGHT VENTRICLE: Normal right ventricular  chamber size. Normal right ventricular wall thickness. Mildly reduced right ventricular systolic function. RVEF = 49% Mild hypokinesis. No post contrast delayed myocardial enhancement. ATRIA: Left atrium: Mildly dilated Right atrium: normal PERICARDIUM: Normal pericardium.  Trivial pericardial effusion. OTHER: No significant extracardiac findings. MEASUREMENTS: Qp/Qs: 1.2 VALVES: Tricuspid aortic valve. Aortic valve regurgitation: Mild, regurgitant fraction 8% Pulmonary valve regurgitation: Trivial, regurgitant fraction 3% Mitral valve regurgitation: Trivial, regurgitant fraction 7% Tricuspid valve regurgitation: Trivial, regurgitant fraction 2% Left ventricle: LV male LV EF: 34% (normal 49-79%) Absolute volumes: LV EDV: (Normal 95-215 mL) LV ESV: 78mL (Normal 25-85 mL) LV SV: 40mL (Normal 61-145 mL) CO: 2.3L/min (Normal 3.4-7.8 L/min) Indexed volumes: CI: 1.15L/min/sq-m (Normal 1.8-4.2 L/min/sq-m) LV EDV: 57mL/sq-m (Normal 50-108 mL/sq-m) LV ESV: 75mL/sq-m (Normal 11-47 mL/sq-m) LV SV: 43mL/sq-m (Normal 33-72 mL/sq-m) Right ventricle: RV male RV EF:  49% (Normal 51-80%) Absolute volumes: RV EDV: 91mL (Normal 109-217 mL) RV ESV: 47mL (Normal 23-91 mL) RV SV: 45mL (Normal 71-141 mL) CO: 2.6L/min (Normal 2.8-8.8 L/min) Indexed volumes: CI: 1.28L/min/sq-m (Normal 1.7-4.2 L/min/sq-m) RV EDV: 35mL/sq-m (Normal 58-109 mL/sq-m) RV ESV: 39mL/sq-m (Normal 12-46 mL/sq-m) RV SV: 28mL/sq-m (Normal 38-71 mL/sq-m) IMPRESSION: 1. 4 x 4 cm LV apical aneurysm with akinesis and transmural delayed myocardial enhancement. There is no discrete LV apical thrombus. Slow flow noted at apex on cine images. 2. Moderately reduced LV systolic function with grossly normal LV chamber size. LVEF 34%. Inferior wall delayed myocardial enhancement consistent with inferior infarct. 3. Mildly reduced RV systolic function with normal RV chamber size. RVEF 49%. Electronically Signed   By: Grady Lawman M.D.   On: 06/22/2023 16:49   MR CARDIAC  VELOCITY FLOW MAP Result Date: 06/22/2023 CLINICAL DATA:  Cardiac thrombus or embolic source suspected possible LV thrombus COMPARISON: Echocardiogram 06/20/23 EXAM: MR CARDIA MORPHOLOGY WITHOUT AND WITH CONTRAST; MR CARDIAC VELOCITY FLOW MAPPING TECHNIQUE: The patient was scanned on a 1.5 Tesla Siemens magnet. A dedicated cardiac coil was used. Functional imaging was done using TrueFisp sequences. 2,3, and 4 chamber views were done to assess for RWMA's. Modified Simpson's rule using a short axis stack was used to calculate an ejection fraction on a dedicated work [employer], [employer]. The patient received 10mL GADAVIST  GADOBUTROL  1 MMOL/ML IV SOLN. After 10 minutes inversion recovery sequences were used to assess for infiltration and scar tissue. Phase contrast velocity encoded images obtained x 2. This examination is tailored for evaluation cardiac anatomy and function and  provides very limited assessment of noncardiac structures, which are accordingly not evaluated during interpretation. If there is clinical concern for extracardiac pathology, further evaluation with CT imaging should be considered. FINDINGS: LEFT VENTRICLE: Left ventricular chamber size: Normal. Left ventricular wall thickness: Mildly increased. Maximal wall thickness 13 mm. Left ventricular systolic function: Moderately reduced LVEF = 34% Apical akinesis. There is no discrete LV apical thrombus on long TI images. Slow flow noted at apex. No myocardial edema Abnormal first pass perfusion at apex. There is post contrast delayed myocardial enhancement: Inferior wall LGE which is transmural at the base and mid. At the mid to apex, the LGE in the inferior wall is 50% of the myocardial thickness. With 7 days of MI, LGE may be overestimated for the assessment of viability. There is circumferential LGE with no viability at the apex (>50% myocardial thickness), extending into an LV apical aneurysm with transmural LGE. Normal T1 myocardial  nulling kinetics suggest against a diagnosis of cardiac amyloidosis. ECV = 30% in areas of normal myocardium. RIGHT VENTRICLE: Normal right ventricular chamber size. Normal right ventricular wall thickness. Mildly reduced right ventricular systolic function. RVEF = 49% Mild hypokinesis. No post contrast delayed myocardial enhancement. ATRIA: Left atrium: Mildly dilated Right atrium: normal PERICARDIUM: Normal pericardium.  Trivial pericardial effusion. OTHER: No significant extracardiac findings. MEASUREMENTS: Qp/Qs: 1.2 VALVES: Tricuspid aortic valve. Aortic valve regurgitation: Mild, regurgitant fraction 8% Pulmonary valve regurgitation: Trivial, regurgitant fraction 3% Mitral valve regurgitation: Trivial, regurgitant fraction 7% Tricuspid valve regurgitation: Trivial, regurgitant fraction 2% Left ventricle: LV male LV EF: 34% (normal 49-79%) Absolute volumes: LV EDV: (Normal 95-215 mL) LV ESV: 78mL (Normal 25-85 mL) LV SV: 40mL (Normal 61-145 mL) CO: 2.3L/min (Normal 3.4-7.8 L/min) Indexed volumes: CI: 1.15L/min/sq-m (Normal 1.8-4.2 L/min/sq-m) LV EDV: 7mL/sq-m (Normal 50-108 mL/sq-m) LV ESV: 5mL/sq-m (Normal 11-47 mL/sq-m) LV SV: 87mL/sq-m (Normal 33-72 mL/sq-m) Right ventricle: RV male RV EF:  49% (Normal 51-80%) Absolute volumes: RV EDV: 91mL (Normal 109-217 mL) RV ESV: 47mL (Normal 23-91 mL) RV SV: 45mL (Normal 71-141 mL) CO: 2.6L/min (Normal 2.8-8.8 L/min) Indexed volumes: CI: 1.28L/min/sq-m (Normal 1.7-4.2 L/min/sq-m) RV EDV: 7mL/sq-m (Normal 58-109 mL/sq-m) RV ESV: 63mL/sq-m (Normal 12-46 mL/sq-m) RV SV: 36mL/sq-m (Normal 38-71 mL/sq-m) IMPRESSION: 1. 4 x 4 cm LV apical aneurysm with akinesis and transmural delayed myocardial enhancement. There is no discrete LV apical thrombus. Slow flow noted at apex on cine images. 2. Moderately reduced LV systolic function with grossly normal LV chamber size. LVEF 34%. Inferior wall delayed myocardial enhancement consistent with inferior infarct. 3. Mildly  reduced RV systolic function with normal RV chamber size. RVEF 49%. Electronically Signed   By: Grady Lawman M.D.   On: 06/22/2023 16:49   CARDIAC CATHETERIZATION Result Date: 06/21/2023 Conclusions: Severe distal RCA bifurcation stenoses ([place] 1, 1, 1), as seen on diagnostic catheterization earlier this week. Widely patent proximal-mid RCA stent. Successful bifurcation PCI of distal RCA extending into the RPDA and RPAV using Culotte technique with 0% residual stenosis and TIMI-3 flow. Recommendations: Dual antiplatelet therapy with aspirin  and prasugrel  for at least 12 months (may need to transition to clopidogrel  when the patient turns 73 years old later this year). Resume heparin  infusion 2 hours after TR band removal given concern for LV apical thrombus pending cardiac MRI. Continue aggressive secondary prevention of coronary artery disease and optimization of goal-directed medical therapy for chronic HFrEF. Sammy Crisp, MD [facility]  ECHOCARDIOGRAM COMPLETE Result Date: 06/20/2023    ECHOCARDIOGRAM REPORT   Patient Name:  Gregory Cole Date of Exam: 06/20/2023 Medical Rec #:  [id number]       Height:       67.0 in Accession #:    [id number]      Weight:       185.2 lb Date of Birth:  [birth year]      BSA:          1.957 m Patient Age:    72 years        BP:           88/61 mmHg Patient Gender: M               HR:           57 bpm. Exam Location:  Inpatient Procedure: 2D Echo, Cardiac Doppler, Color Doppler and Intracardiac            Opacification Agent (Both Spectral and Color Flow Doppler were            utilized during procedure). Indications:    I25.110 Atherosclerotic heart disease of native coronary artery                 with unstable angina pectoris  History:        Patient has prior history of Echocardiogram examinations. CAD                 and Acute MI; Risk Factors:Current Smoker and Hypertension.  Sonographer:    Raynelle Callow RDCS Referring Phys: [id number] CHRISTOPHER D [facility]   Sonographer Comments: Technically difficult study due to poor echo windows. IMPRESSIONS  1. Echo contrast swirl in apex concerning for possible thrombus; this appears similar to contrast images from 2022, when thrombus was then excluded by MRI. Left ventricular ejection fraction, by estimation, is 30 to 35%. The left ventricle has moderately decreased function. The left ventricle demonstrates regional wall motion abnormalities (see scoring diagram/findings for description). Left ventricular diastolic parameters are consistent with Grade I diastolic dysfunction (impaired relaxation).  2. Right ventricular systolic function is low normal. The right ventricular size is mildly enlarged. Tricuspid regurgitation signal is inadequate for assessing PA pressure.  3. The mitral valve is normal in structure. Trivial mitral valve regurgitation. No evidence of mitral stenosis.  4. The aortic valve was not well visualized. Aortic valve regurgitation is not visualized. No aortic stenosis is present.  5. The inferior vena cava is dilated in size with <50% respiratory variability, suggesting right atrial pressure of 15 mmHg. Comparison(s): No significant change from prior study. Prior images reviewed side by side. Conclusion(s)/Recommendation(s): Overall similar to images from 2022; LV apical aneurysm present previously. Both images from 2022 and this study show significant swirling at apex, concerning for possible LV thrombus. Cardiac MRI excluded thrombus in 2022; while aneursym is not new, would consider cardiac MRI for definitive evaluation if there is no other indication for long term anticoagulation. FINDINGS  Left Ventricle: Echo contrast swirl in apex concerning for possible thrombus; this appears similar to contrast images from 2022, when thrombus was then excluded by MRI. Left ventricular ejection fraction, by estimation, is 30 to 35%. The left ventricle has moderately decreased function. The left ventricle demonstrates  regional wall motion abnormalities. Definity  contrast agent was given IV to delineate the left ventricular endocardial borders. The left ventricular internal cavity size was normal in size. There is no left ventricular hypertrophy. Left ventricular diastolic parameters are consistent with Grade I diastolic dysfunction (impaired relaxation).  LV Wall Scoring: The entire apex is  aneurysmal. The inferior wall, posterior wall, and mid inferoseptal segment are akinetic. The mid anteroseptal segment, mid anterolateral segment, mid anterior segment, and basal inferoseptal segment are hypokinetic. The basal anteroseptal segment, basal anterolateral segment, and basal anterior segment are normal. Right Ventricle: The right ventricular size is mildly enlarged. No increase in right ventricular wall thickness. Right ventricular systolic function is low normal. Tricuspid regurgitation signal is inadequate for assessing PA pressure. Left Atrium: Left atrial size was normal in size. Right Atrium: Right atrial size was normal in size. Pericardium: There is no evidence of pericardial effusion. Mitral Valve: The mitral valve is normal in structure. Trivial mitral valve regurgitation. No evidence of mitral valve stenosis. Tricuspid Valve: The tricuspid valve is normal in structure. Tricuspid valve regurgitation is not demonstrated. No evidence of tricuspid stenosis. Aortic Valve: The aortic valve was not well visualized. Aortic valve regurgitation is not visualized. No aortic stenosis is present. Pulmonic Valve: The pulmonic valve was not well visualized. Pulmonic valve regurgitation is not visualized. No evidence of pulmonic stenosis. Aorta: The aortic root and ascending aorta are structurally normal, with no evidence of dilitation. Venous: The inferior vena cava is dilated in size with less than 50% respiratory variability, suggesting right atrial pressure of 15 mmHg. IAS/Shunts: No atrial level shunt detected by color flow Doppler.   LEFT VENTRICLE PLAX 2D LVIDd:         5.10 cm      Diastology LVIDs:         4.30 cm      LV e' medial:    3.48 cm/s LV PW:         0.90 cm      LV E/e' medial:  13.1 LV IVS:        0.80 cm      LV e' lateral:   7.40 cm/s LVOT diam:     2.20 cm      LV E/e' lateral: 6.2 LV SV:         71 LV SV Index:   37 LVOT Area:     3.80 cm  LV Volumes (MOD) LV vol d, MOD A2C: 96.6 ml LV vol d, MOD A4C: 128.0 ml LV vol s, MOD A2C: 55.2 ml LV vol s, MOD A4C: 90.4 ml LV SV MOD A2C:     41.4 ml LV SV MOD A4C:     128.0 ml LV SV MOD BP:      42.2 ml RIGHT VENTRICLE            IVC RV S prime:     9.03 cm/s  IVC diam: 2.40 cm TAPSE (M-mode): 1.8 cm LEFT ATRIUM           Index        RIGHT ATRIUM           Index LA diam:      3.30 cm 1.69 cm/m   RA Area:     15.80 cm LA Vol (A2C): 27.0 ml 13.80 ml/m  RA Volume:   42.40 ml  21.66 ml/m LA Vol (A4C): 33.9 ml 17.32 ml/m  AORTIC VALVE LVOT Vmax:   87.50 cm/s LVOT Vmean:  56.100 cm/s LVOT VTI:    0.188 m  AORTA Ao Root diam: 3.90 cm Ao Asc diam:  3.20 cm MITRAL VALVE MV Area (PHT): 2.48 cm    SHUNTS MV Decel Time: 306 msec    Systemic VTI:  0.19 m MV E velocity: 45.60 cm/s  Systemic Diam: 2.20 cm  MV A velocity: 87.50 cm/s MV E/A ratio:  0.52 Sheryle Donning MD Electronically signed by Sheryle Donning MD Signature Date/Time: 06/20/2023/7:31:59 PM    Final    CARDIAC CATHETERIZATION Result Date: 06/19/2023   Mid LAD to Dist LAD lesion is 100% stenosed.   Mid LAD lesion is 20% stenosed.   RPDA-2 lesion is 50% stenosed.   2nd RPL lesion is 50% stenosed.   RPDA-1 lesion is 99% stenosed.   RPAV lesion is 99% stenosed.   Ost Cx to Prox Cx lesion is 30% stenosed.   Ramus lesion is 20% stenosed.   Prox LAD lesion is 80% stenosed.   Dist RCA lesion is 80% stenosed. Chronic LAD occlusion. The distal LAD fills from left to left and right to left collaterals The Circumflex has mild plaque. The intermediate branch stent is patent The RCA is a dominant vessel with patent proximal and  mid stents. The distal RCA has a severe stenosis. The PDA has a severe ostial stenosis. The posterolateral artery has a severe ostial stenosis. 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 LVEDP 19 mmHg Recommendations: He is chest pain free. He has complex disease in the distal RCA involving both the large Posterolateral artery and the large PDA. He has chronic occlusion of the mid LAD. Will review films with the IC team tomorrow. Consider complex PCI vs CABG. Will start IV heparin  and IV NTG tonight. High intensity statin. Continue ASA. Hold PLavix . Echo tomorrow.   DG Chest Portable 1 View Result Date: 06/19/2023 CLINICAL DATA:  cp EXAM: PORTABLE CHEST - 1 VIEW COMPARISON:  April 05, 2019 FINDINGS: Low lung volumes. No focal airspace consolidation, pleural effusion, or pneumothorax. No cardiomegaly. Aortic atherosclerosis. No acute fracture or destructive lesions. Multilevel thoracic osteophytosis. Bilateral AC joint osteoarthritis. IMPRESSION: Low lung volumes.  No acute cardiopulmonary abnormality. Electronically Signed   By: Rance Burrows M.D.   On: 06/19/2023 17:42    Disposition Pt is being discharged home today in good condition.  Follow-up Plans & Appointments  Follow-up Information     [facility]   Specialty: General Practice Why: please schedule [facility]-14 days Contact information: [address] [place] [address] [phone]                Discharge Instructions     AMB Referral to [facility] Pharm-D   Complete by: As directed    Reason For Referral: Lipids Comment - Post-ACS, urgent   AMB referral to CHF clinic   Complete by: As directed    [facility] patient- need prior Auth for HF TOC  EF 30-35% STEMI   Reason for referral: Systolic HF   Amb Referral to Cardiac Rehabilitation   Complete by: As directed    Diagnosis:  Coronary Stents STEMI PTCA     After initial evaluation and  assessments completed: Virtual Based Care may be provided alone or in conjunction with Phase 2 Cardiac Rehab based on patient barriers.: Yes   Intensive Cardiac Rehabilitation (ICR) [facility] location only OR Traditional Cardiac Rehabilitation (TCR) *If criteria for ICR are not met will enroll in TCR [facility] only): Yes   Call MD for:  redness, tenderness, or signs of infection (pain, swelling, redness, odor or green/yellow discharge around incision site)   Complete by: As directed    Diet - low sodium heart healthy   Complete by: As directed    Increase activity slowly   Complete by: As directed  Discharge Medications Allergies as of 06/23/2023   No Known Allergies      Medication List     STOP taking these medications    sacubitril -valsartan  49-51 MG Commonly known as: ENTRESTO    Synjardy  XR 12.05-998 MG Tb24 Generic drug: Empagliflozin -metFORMIN HCl ER       TAKE these medications    aspirin  EC 81 MG tablet Take 81 mg by mouth every 6 (six) hours as needed for mild pain (pain score 1-3). Evening 1600   atorvastatin  80 MG tablet Commonly known as: LIPITOR  Take 1 tablet (80 mg total) by mouth daily at 6 PM.   carvedilol  12.5 MG tablet Commonly known as: COREG  Take 6.25 mg by mouth 2 (two) times daily with a meal.   clopidogrel  75 MG tablet Commonly known as: PLAVIX  Take 1 tablet (75 mg total) by mouth daily.   empagliflozin  10 MG Tabs tablet Commonly known as: JARDIANCE  Take 1 tablet (10 mg total) by mouth daily. Start taking on: June 24, 2023   ferrous sulfate 325 (65 FE) MG tablet Take 325 mg by mouth daily with breakfast.   furosemide  40 MG tablet Commonly known as: Lasix  Take 1 tablet (40 mg total) by mouth daily as needed (For weight gain > 3 lbs overnight.).   levothyroxine  88 MCG tablet Commonly known as: SYNTHROID  Take 88 mcg by mouth daily before breakfast.   nitroGLYCERIN  0.4 MG SL tablet Commonly known as: NITROSTAT  Place 1 tablet (0.4 mg  total) under the tongue every 5 (five) minutes as needed for chest pain. What changed:  how much to take when to take this reasons to take this   pantoprazole  40 MG tablet Commonly known as: PROTONIX  Take 1 tablet (40 mg total) by mouth daily.   prasugrel  10 MG Tabs tablet Commonly known as: EFFIENT  Take 1 tablet (10 mg total) by mouth daily. Start taking on: June 24, 2023   vitamin B-12 500 MCG tablet Commonly known as: CYANOCOBALAMIN  Take 500 mcg by mouth daily.         Outstanding Labs/Studies None  Duration of Discharge Encounter: APP Time: 42 minutes   Signed, Armandina Bernard, PA-C 06/23/2023, 10:14 AM

## 2023-06-23 NOTE — Progress Notes (Signed)
   Rounding Note    Patient Name: Gregory Cole Date of Encounter: 06/23/2023  [facility] Cardiologist: Magnus Schuller, MD   Subjective   NAEO. Feels well this AM. Eager to discharge.  MRI yesterday without thrombus  Vital Signs    Vitals:   06/22/23 1747 06/22/23 1939 06/22/23 2315 06/23/23 0351  BP:  132/66 95/60 (!) 120/57  Pulse: 84 70 64 70  Resp:  16 20 18   Temp:  98.4 F (36.9 C) 98.3 F (36.8 C) 98 F (36.7 C)  TempSrc:  Oral Oral Oral  SpO2:  92% 95% 97%  Weight:      Height:        Intake/Output Summary (Last 24 hours) at 06/23/2023 0837 Last data filed at 06/22/2023 2328 Gross per 24 hour  Intake 120 ml  Output 675 ml  Net -555 ml      06/19/2023    7:15 PM 06/19/2023    5:00 PM 09/05/2022   10:09 AM  Last 3 Weights  Weight (lbs) 185 lb 3 oz 185 lb 3 oz 176 lb  Weight (kg) 84 kg 84 kg 79.833 kg      Telemetry    Personally Reviewed  ECG    Personally Reviewed  Physical Exam   GEN: No acute distress.   Cardiac: RRR, no murmurs, rubs, or gallops. Radial access site healing well. Respiratory: Clear to auscultation bilaterally. Psych: Normal affect   Assessment & Plan    #ACS #NSTEMI Hemodynamically and  electrically stable. No CP Cont aspirin , prasugrel , coreg  Will need outpatient reassessment of LV function after 45 days to determine need for ICD  #HFrEF #ICM Plan to start entresto /arb as outpatient. BP have been low intermittently. He should monitor his pressures at home to determine ability to start ARNi/ARB at outpatient follow up. Cont coreg , jardiance   OK to discharge today    Donelda Fujita T. Marven Slimmer, MD, [facility], [facility] Cardiac Electrophysiology

## 2023-06-25 LAB — LIPOPROTEIN A (LPA): Lipoprotein (a): 48 nmol/L — ABNORMAL HIGH (ref <75.0)

## 2023-06-28 ENCOUNTER — Telehealth (HOSPITAL_COMMUNITY): Payer: Self-pay

## 2023-06-28 NOTE — Telephone Encounter (Signed)
 Pt is unable to participate in the cardiac rehab program, has to care for his wife.   Closed referral

## 2023-07-23 ENCOUNTER — Other Ambulatory Visit (HOSPITAL_BASED_OUTPATIENT_CLINIC_OR_DEPARTMENT_OTHER): Payer: Self-pay

## 2023-07-24 ENCOUNTER — Encounter: Payer: Self-pay | Admitting: Cardiovascular Disease

## 2023-07-24 ENCOUNTER — Ambulatory Visit: Attending: Cardiology | Admitting: Cardiovascular Disease

## 2023-07-24 VITALS — BP 114/70 | HR 65 | Ht 67.0 in | Wt 165.2 lb

## 2023-07-24 DIAGNOSIS — I25110 Atherosclerotic heart disease of native coronary artery with unstable angina pectoris: Secondary | ICD-10-CM | POA: Diagnosis not present

## 2023-07-24 DIAGNOSIS — I255 Ischemic cardiomyopathy: Secondary | ICD-10-CM | POA: Diagnosis not present

## 2023-07-24 DIAGNOSIS — Z720 Tobacco use: Secondary | ICD-10-CM

## 2023-07-24 DIAGNOSIS — E1159 Type 2 diabetes mellitus with other circulatory complications: Secondary | ICD-10-CM

## 2023-07-24 DIAGNOSIS — E1169 Type 2 diabetes mellitus with other specified complication: Secondary | ICD-10-CM | POA: Diagnosis not present

## 2023-07-24 DIAGNOSIS — E785 Hyperlipidemia, unspecified: Secondary | ICD-10-CM | POA: Diagnosis not present

## 2023-07-24 DIAGNOSIS — I152 Hypertension secondary to endocrine disorders: Secondary | ICD-10-CM

## 2023-07-24 NOTE — Assessment & Plan Note (Signed)
 History of ischemic cardiomyopathy with an EF of 34%.  He recently had a cardiac MRI that showed a large anteroapical aneurysm.  EF was 34%.  He was discharged home on carvedilol  with intent to see the advanced heart failure TOC clinic which never occurred.  He is not on GDMT.  He will need to be started on Entresto  as an outpatient if he can afford this.  We will have him see Pharm.D. to initiate and titrate.

## 2023-07-24 NOTE — Progress Notes (Signed)
 07/24/2023 [name]   [birth year]  [id number]  [facility]ician [facility] Primary Cardiologist: Dorn JINNY Lesches MD [facility] [place], [place]  HPI:  Gregory Cole is a 73 y.o. thin-appearing married Caucasian male with no biologic children whose wife Gregory Cole was a patient of mine in the past and now has Alzheimer's.  Patient is formally a patient of Dr. Joesphine who he saw 3 years ago.  I am assuming his care in Dr. Joesphine absence.  His risk factors include 75 pack years of tobacco abuse having quit in 2010 at the time of his first coronary intervention.  He has treated hypertension, diabetes and hyperlipidemia.  He was adopted so does not know his family history.  His first intervention was in 2010 with cath performed Dr. Burnard revealing an occluded mid LAD.  He had ramus branch stenting at that time.  He has also had RCA intervention in 2022 by Dr. Claudene in the setting of inferior STEMI.  He presented 06/19/2023 with chest pain and had inferior STEMI.  His LAD was found to be occluded.  Ramus stent stent was patent as was his mid RCA stent.  He had distal PDA PLA bifurcation disease.  Cath was performed with Dr. Verlin.  He separately had a cardiac MRI that showed a large anteroapical aneurysm without thrombus with an EF of 34%.  He underwent staged distal PDA PLA bifurcation stenting by Dr. Mady.  He was discharged home on DAPT with aspirin  and prasugrel .  There was mention in the discharge summary about the possibility of an ICD for primary prevention if his EF does not improve after 3 months on GDMT.   Current Meds  Medication Sig   aspirin  EC 81 MG tablet Take 81 mg by mouth every 6 (six) hours as needed for mild pain (pain score 1-3). Evening 1600 (Patient taking differently: Take 81 mg by mouth. Evening 1600)   atorvastatin  (LIPITOR ) 80 MG tablet Take 1 tablet (80 mg total) by mouth daily at 6 PM.   carvedilol  (COREG ) 12.5 MG tablet Take 6.25 mg by mouth 2 (two) times  daily with a meal.   clopidogrel  (PLAVIX ) 75 MG tablet Take 1 tablet (75 mg total) by mouth daily.   ferrous sulfate 325 (65 FE) MG tablet Take 325 mg by mouth daily with breakfast.   levothyroxine  (SYNTHROID , LEVOTHROID) 88 MCG tablet Take 88 mcg by mouth daily before breakfast.   pantoprazole  (PROTONIX ) 40 MG tablet Take 1 tablet (40 mg total) by mouth daily.   prasugrel  (EFFIENT ) 10 MG TABS tablet Take 1 tablet (10 mg total) by mouth daily.   vitamin B-12 (CYANOCOBALAMIN ) 500 MCG tablet Take 500 mcg by mouth daily.     No Known Allergies  Social History   Socioeconomic History   Marital status: Married    Spouse name: Gregory Cole   Number of children: 1   Years of education: Not on file   Highest education level: High school graduate  Occupational History   Occupation: retired    Comment: [employer]  Tobacco Use   Smoking status: Former    Current packs/day: 0.00    Average packs/day: 2.0 packs/day for 40.0 years (80.0 ttl pk-yrs)    Types: Cigarettes    Start date: 01/10/1968    Quit date: 01/10/2008    Years since quitting: 15.5   Smokeless tobacco: Never  Vaping Use   Vaping status: Never Used  Substance and Sexual Activity   Alcohol use:  Yes    Comment: occasional beer   Drug use: No   Sexual activity: Not Currently  Other Topics Concern   Not on file  Social History Narrative   Lives with his wife (who has dementia) - first floor of apartment complex   Social Drivers of Health   Financial Resource Strain: Low Risk  (06/21/2023)   Overall Financial Resource Strain (CARDIA)    Difficulty of Paying Living Expenses: Not hard at all  Food Insecurity: No Food Insecurity (06/19/2023)   Hunger Vital Sign    Worried About Running Out of Food in the Last Year: Never true    Ran Out of Food in the Last Year: Never true  Transportation Needs: No Transportation Needs (06/21/2023)   PRAPARE - Administrator, Civil Service (Medical): No    Lack of Transportation (Non-Medical):  No  Physical Activity: Insufficiently Active (07/31/2022)   Exercise Vital Sign    Days of Exercise per Week: 3 days    Minutes of Exercise per Session: 30 min  Stress: No Stress Concern Present (07/31/2022)   [employer] of Occupational Health - Occupational Stress Questionnaire    Feeling of Stress : Not at all  Social Connections: Moderately Isolated (06/19/2023)   Social Connection and Isolation Panel    Frequency of Communication with Friends and Family: More than three times a week    Frequency of Social Gatherings with Friends and Family: More than three times a week    Attends Religious Services: Never    Database administrator or Organizations: No    Attends Banker Meetings: Never    Marital Status: Married  Catering manager Violence: Not At Risk (06/19/2023)   Humiliation, Afraid, Rape, and Kick questionnaire    Fear of Current or Ex-Partner: No    Emotionally Abused: No    Physically Abused: No    Sexually Abused: No     Review of Systems: General: negative for chills, fever, night sweats or weight changes.  Cardiovascular: negative for chest pain, dyspnea on exertion, edema, orthopnea, palpitations, paroxysmal nocturnal dyspnea or shortness of breath Dermatological: negative for rash Respiratory: negative for cough or wheezing Urologic: negative for hematuria Abdominal: negative for nausea, vomiting, diarrhea, bright red blood per rectum, melena, or hematemesis Neurologic: negative for visual changes, syncope, or dizziness All other systems reviewed and are otherwise negative except as noted above.    Blood pressure 114/70, pulse 65, height 5' 7 (1.702 m), weight 165 lb 3.2 oz (74.9 kg), SpO2 98%.  General appearance: alert and no distress Neck: no adenopathy, no carotid bruit, no JVD, supple, symmetrical, trachea midline, and thyroid  not enlarged, symmetric, no tenderness/mass/nodules Lungs: clear to auscultation bilaterally Heart: regular rate  and rhythm, S1, S2 normal, no murmur, click, rub or gallop Extremities: extremities normal, atraumatic, no cyanosis or edema Pulses: 2+ and symmetric Skin: Skin color, texture, turgor normal. No rashes or lesions Neurologic: Grossly normal  EKG not performed today      ASSESSMENT AND PLAN:   Coronary artery disease involving native heart with unstable angina pectoris (HCC) History of CAD status post multiple stents in the past beginning in November 2010 when he had a total LAD.  He has had stenting of a ramus branch, of the RCA and stenting of inferior STEMI.  He was admitted on 06/19/2023 with inferior STEMI and underwent By Dr. Verlin revealing occluded LAD, patent ramus branch stent, patent stent in the mid RCA with high-grade disease in  the distal RCA.  He has an apical aneurysm.  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  He underwent staged distal RCA/PDA/PLA bifurcation stenting by Dr. Mady and was discharged home on aspirin  and Effient ..  He has been asymptomatic since.  Tobacco use Discontinue tobacco abuse in 2010 when he had his first intervention after smoking 75 pack years.  Hyperlipidemia associated with type 2 diabetes mellitus (HCC) History of hyperlipidemia on high-dose statin therapy since his recent admission however prior to that he was not on a statin drug.  His most recent lipid profile performed 06/20/2023 revealed total cholesterol 161, LDL of 107 and HDL 33.  LDL goal of 55.  Will recheck a lipid liver profile after 2 months.  Hypertension associated with diabetes (HCC) History of essential hypertension with blood pressure measured today 114/70.  He is on carvedilol .  Ischemic cardiomyopathy -> in setting of inferior STEMI, LV gram EF of 35 to 40% inferior wall hypokinesis.  LVEDP 26. History of ischemic cardiomyopathy with an EF of 34%.  He recently had a cardiac MRI that showed a large anteroapical  aneurysm.  EF was 34%.  He was discharged home on carvedilol  with intent to see the advanced heart failure TOC clinic which never occurred.  He is not on GDMT.  He will need to be started on Entresto  as an outpatient if he can afford this.  We will have him see Pharm.D. to initiate and titrate.     Dorn DOROTHA Lesches MD FACP,FACC,FAHA, FSCAI 07/24/2023 10:59 AM

## 2023-07-24 NOTE — Assessment & Plan Note (Signed)
 Discontinue tobacco abuse in 2010 when he had his first intervention after smoking 75 pack years.

## 2023-07-24 NOTE — Assessment & Plan Note (Signed)
 History of hyperlipidemia on high-dose statin therapy since his recent admission however prior to that he was not on a statin drug.  His most recent lipid profile performed 06/20/2023 revealed total cholesterol 161, LDL of 107 and HDL 33.  LDL goal of 55.  Will recheck a lipid liver profile after 2 months.

## 2023-07-24 NOTE — Patient Instructions (Addendum)
 Medication Instructions:  Your physician recommends that you continue on your current medications as directed. Please refer to the Current Medication list given to you today.  *If you need a refill on your cardiac medications before your next appointment, please call your pharmacy*  Lab Work: Your physician recommends that you return for lab work in: 2 months for FASTING lipid/liver panel  If you have labs (blood work) drawn today and your tests are completely normal, you will receive your results only by: MyChart Message (if you have MyChart) OR A paper copy in the mail If you have any lab test that is abnormal or we need to change your treatment, we will call you to review the results.   Follow-Up: At [facility], you and your health needs are our priority.  As part of our continuing mission to provide you with exceptional heart care, our providers are all part of one team.  This team includes your primary Cardiologist (physician) and Advanced Practice Providers or APPs (Physician Assistants and Nurse Practitioners) who all work together to provide you with the care you need, when you need it.  Your next appointment:   3 month(s)  Provider:   Jon Hails, PA-C, Lum Louis, NP, Aline Door, PA-C, Kathleen Johnson, PA-C, Hao Meng, PA-C, Damien Braver, NP, or Katlyn West, NP         Then, Dorn Lesches, MD will plan to see you again in 12 month(s).    We recommend signing up for the patient portal called MyChart.  Sign up information is provided on this After Visit Summary.  MyChart is used to connect with patients for Virtual Visits (Telemedicine).  Patients are able to view lab/test results, encounter notes, upcoming appointments, etc.  Non-urgent messages can be sent to your provider as well.   To learn more about what you can do with MyChart, go to [email or web address].   Other Instructions Please bring all your medication bottles with you to Pharmacy  appointment.

## 2023-07-24 NOTE — Assessment & Plan Note (Signed)
 History of essential hypertension with blood pressure measured today 114/70.  He is on carvedilol .

## 2023-07-24 NOTE — Assessment & Plan Note (Signed)
 History of CAD status post multiple stents in the past beginning in November 2010 when he had a total LAD.  He has had stenting of a ramus branch, of the RCA and stenting of inferior STEMI.  He was admitted on 06/19/2023 with inferior STEMI and underwent By Dr. Verlin revealing occluded LAD, patent ramus branch stent, patent stent in the mid RCA with high-grade disease in the distal RCA.  He has an apical aneurysm.  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  He underwent staged distal RCA/PDA/PLA bifurcation stenting by Dr. Mady and was discharged home on aspirin  and Effient ..  He has been asymptomatic since.

## 2023-07-27 ENCOUNTER — Ambulatory Visit: Attending: Cardiovascular Disease | Admitting: Pharmacist Clinician (PhC)/ Clinical Pharmacy Specialist

## 2023-07-27 ENCOUNTER — Encounter: Payer: Self-pay | Admitting: Pharmacist Clinician (PhC)/ Clinical Pharmacy Specialist

## 2023-07-27 DIAGNOSIS — E785 Hyperlipidemia, unspecified: Secondary | ICD-10-CM

## 2023-07-27 DIAGNOSIS — E1169 Type 2 diabetes mellitus with other specified complication: Secondary | ICD-10-CM | POA: Diagnosis not present

## 2023-07-27 NOTE — Patient Instructions (Signed)
 Follow up appointment:   Your Results:             Your most recent labs Goal  Total Cholesterol 161 < 200  Triglycerides 105 < 150  HDL (happy/good cholesterol) 33 > 40  LDL (lousy/bad cholesterol 107 < 55   Medication changes:  Cholesterol:  We would like for you to start Repatha 140 mg every 14 days (or Praluent 150 mg every 14 days).    Lab orders:  Repeat cholesterol labs 2-3 months after starting Repatha   If this will not be [facility] by the [facility], please reach out to me, as we can look for ways to get the medication at low to no cost.    Consider drinking a protein drink (Premiere Protein), to help increase your protein intake.   Call me at the main number [phone]) if you have any questions or concerns  Thank you for choosing [facility]  Allean Mink PharmD

## 2023-07-27 NOTE — Assessment & Plan Note (Signed)
 Assessment: Patient with ASCVD not at LDL goal of < 55 Most recent LDL 107 on 06/20/23 Has been compliant with high intensity statin : atorvastatin  80 mg daily Reviewed options for lowering LDL cholesterol, including ezetimibe, PCSK-9 inhibitors, bempedoic acid and inclisiran.  Discussed mechanisms of action, dosing, side effects, potential decreases in LDL cholesterol and costs.  Also reviewed potential options for patient assistance.  Plan: Patient agreeable to starting Repatha 140 mg q14d Repeat labs after:  2-3 months Lipid Liver function [facility] [place] [facility] of the [facility]. Will take AVS from today's visit to get Repatha started while there.   If [facility] won't cover, patient should call back to let us  know.

## 2023-07-27 NOTE — Progress Notes (Signed)
 Office Visit    Patient Name: Gregory Cole Date of Encounter: 07/27/2023  [facility] Primary Cardiologist:  None  Chief Complaint    Hyperlipidemia, CHF  Significant Past Medical History   CAD Multiple stents starting in 2010 - LAD, ramus, RCA; 6/25 STEMI, staged distal RCA stenting; now on ASA, prasugrel   HFrEF 6/25 EF at 34%, on carvedilol , prn furosemide   HTN Controlled with current medications  DM2 6/25 A1c 7.2, no medications        No Known Allergies  History of Present Illness    Gregory Cole is a 73 y.o. male patient of Dr Court, in the office today to discuss options for cholesterol management.  Gregory Cole was hospitalized in June with STEMI and underwent staged distal RCA stenting.  EF at the time was 34%, however GDMT was not initiated secondary to low BP.   He followed up with Dr. Court (previously was seen by Dr. Burnard) in July, and BP was improved to 114 systolic.  He was asked to follow up with PharmD for both lipid management as well as titration of GDMT.    Today he is in the office for follow up.  He brings all of his medications, as some are from the [facility] and not in our system.  He currently is taking both Jardiance  and Entresto .  Jardiance  is combined with metformin and I advised that he continue with this until his appointment with the [facility].  They can determine if he needs both or can d/c the metformin.  He is also on Entresto  49/51 and notes has been on this for several years.  I updated his medication list to reflect these [facility] medications.    Insurance Carrier:  [facility], [employer] supplemental (no PDP on this card)  [facility]  LDL Cholesterol goal:  LDL < 55  Current Medications:  atorvastatin  80 mg daily  Family Hx: adopted    Social Hx: Tobacco: former smoker, quit 2010  Diet:   admits to not eating well these days.. he is primary caregiver for his wife with Alzheimer's.  Can't sleep much at night as she gets up and  tries to leave the house.  Doesn't think about his own meals, as spends much of his time dealing with her.  Mostly eats easily prepared foods, frozen dinners.  Admits not much protein in his diet.   Exercise: just keeping up with house and wife   Accessory Clinical Findings   Lab Results  Component Value Date   CHOL 161 06/20/2023   HDL 33 (L) 06/20/2023   LDLCALC 107 (H) 06/20/2023   TRIG 105 06/20/2023   CHOLHDL 4.9 06/20/2023    Lipoprotein (a)  Date/Time Value Ref Range Status  06/22/2023 04:29 AM 48.0 (H) <75.0 nmol/L Final    Comment:    (NOTE) This test was developed and its performance characteristics determined by [facility]. It has not been cleared or approved by the Food and Drug Administration. Note:  Values greater than or equal to 75.0 nmol/L may       indicate an independent risk factor for CHD,       but must be evaluated with caution when applied       to non-Caucasian populations due to the       influence of genetic factors on Lp(a) across       ethnicities. Performed At: [facility] [address] [place], [place] [id number] Nagendra Sanjai  MD Ey:[id number]     Lab Results  Component Value Date   ALT 16 05/04/2022   AST 16 05/04/2022   ALKPHOS 65 05/04/2022   BILITOT 0.3 05/04/2022   Lab Results  Component Value Date   CREATININE 1.16 06/23/2023   BUN 21 06/23/2023   NA 140 06/23/2023   K 3.7 06/23/2023   CL 106 06/23/2023   CO2 25 06/23/2023   Lab Results  Component Value Date   HGBA1C 7.2 (H) 06/20/2023    Home Medications    Current Outpatient Medications  Medication Sig Dispense Refill   aspirin  EC 81 MG tablet Take 81 mg by mouth every 6 (six) hours as needed for mild pain (pain score 1-3). Evening 1600     atorvastatin  (LIPITOR ) 80 MG tablet Take 1 tablet (80 mg total) by mouth daily at 6 PM. 30 tablet 11   carvedilol  (COREG ) 12.5 MG tablet Take 6.25 mg by mouth 2 (two) times daily with a meal.     clopidogrel  (PLAVIX ) 75 MG  tablet Take 1 tablet (75 mg total) by mouth daily. 30 tablet 1   Empagliflozin -metFORMIN HCl 12.05-998 MG TABS Take 1 tablet by mouth in the morning and at bedtime.     ferrous sulfate 325 (65 FE) MG tablet Take 325 mg by mouth daily with breakfast.     furosemide  (LASIX ) 40 MG tablet Take 1 tablet (40 mg total) by mouth daily as needed (For weight gain > 3 lbs overnight.). 30 tablet 1   levothyroxine  (SYNTHROID , LEVOTHROID) 88 MCG tablet Take 88 mcg by mouth daily before breakfast.     nitroGLYCERIN  (NITROSTAT ) 0.4 MG SL tablet Place 1 tablet (0.4 mg total) under the tongue every 5 (five) minutes as needed for chest pain. 25 tablet 12   pantoprazole  (PROTONIX ) 40 MG tablet Take 1 tablet (40 mg total) by mouth daily. 30 tablet 11   prasugrel  (EFFIENT ) 10 MG TABS tablet Take 1 tablet (10 mg total) by mouth daily. 30 tablet 11   sacubitril -valsartan  (ENTRESTO ) 49-51 MG Take 1 tablet by mouth 2 (two) times daily.     vitamin B-12 (CYANOCOBALAMIN ) 500 MCG tablet Take 500 mcg by mouth daily.     No current facility-administered medications for this visit.     Assessment & Plan    Hyperlipidemia associated with type 2 diabetes mellitus (HCC) Assessment: Patient with ASCVD not at LDL goal of < 55 Most recent LDL 107 on 06/20/23 Has been compliant with high intensity statin : atorvastatin  80 mg daily Reviewed options for lowering LDL cholesterol, including ezetimibe, PCSK-9 inhibitors, bempedoic acid and inclisiran.  Discussed mechanisms of action, dosing, side effects, potential decreases in LDL cholesterol and costs.  Also reviewed potential options for patient assistance.  Plan: Patient agreeable to starting Repatha 140 mg q14d Repeat labs after:  2-3 months Lipid Liver function Patient [facility] [place] at the end of the month. Will take AVS from today's visit to get Repatha started while there.   If VA won't cover, patient should call back to let us  know.      Tim Corriher, PharmD CPP [facility] [address]   [place], [place] [address] [phone]  07/27/2023, 11:53 AM

## 2023-08-01 ENCOUNTER — Ambulatory Visit: Payer: Medicare HMO

## 2023-08-01 VITALS — BP 114/70 | HR 65 | Ht 67.0 in | Wt 165.0 lb

## 2023-08-01 DIAGNOSIS — Z0000 Encounter for general adult medical examination without abnormal findings: Secondary | ICD-10-CM

## 2023-08-01 NOTE — Progress Notes (Signed)
 Subjective:   Gregory Cole is a 73 y.o. who presents for a Medicare Wellness preventive visit.  As a reminder, Annual Wellness Visits don't include a physical exam, and some assessments may be limited, especially if this visit is performed virtually. We may recommend an in-person follow-up visit with your provider if needed.  Visit Complete: Virtual I connected with  Errick Salts Kirchoff on 08/01/23 by a audio enabled telemedicine application and verified that I am speaking with the correct person using two identifiers.  Patient Location: Home  Provider Location: Home Office  I discussed the limitations of evaluation and management by telemedicine. The patient expressed understanding and agreed to proceed.  Vital Signs: Because this visit was a virtual/telehealth visit, some criteria may be missing or patient reported. Any vitals not documented were not able to be obtained and vitals that have been documented are patient reported.  VideoDeclined- This patient declined Librarian, academic. Therefore the visit was completed with audio only.  Persons Participating in Visit: Patient.  AWV Questionnaire: No: Patient Medicare AWV questionnaire was not completed prior to this visit.  Cardiac Risk Factors include: advanced age (>55men, >41 women);diabetes mellitus;dyslipidemia;hypertension;male gender;smoking/ tobacco exposure     Objective:    Today's Vitals   08/01/23 1423  BP: 114/70  Pulse: 65  Weight: 165 lb (74.8 kg)  Height: 5' 7 (1.702 m)   Body mass index is 25.84 kg/m.     08/01/2023   11:00 AM 06/19/2023    7:15 PM 07/31/2022   11:33 AM 07/26/2021   11:26 AM 07/21/2020    4:11 PM 07/21/2020   12:08 AM 05/28/2020    2:07 PM  Advanced Directives  Does Patient Have a Medical Advance Directive? No No No No  No Yes  Type of Tax inspector;Living will  Copy of Healthcare Power of Attorney in Chart?       No -  copy requested  Would patient like information on creating a medical advance directive?  No - Patient declined No - Patient declined Yes (MAU/Ambulatory/Procedural Areas - Information given) No - Patient declined      Current Medications (verified) Outpatient Encounter Medications as of 08/01/2023  Medication Sig   aspirin  EC 81 MG tablet Take 81 mg by mouth every 6 (six) hours as needed for mild pain (pain score 1-3). Evening 1600   atorvastatin  (LIPITOR ) 80 MG tablet Take 1 tablet (80 mg total) by mouth daily at 6 PM.   carvedilol  (COREG ) 12.5 MG tablet Take 6.25 mg by mouth 2 (two) times daily with a meal.   clopidogrel  (PLAVIX ) 75 MG tablet Take 1 tablet (75 mg total) by mouth daily.   Empagliflozin -metFORMIN HCl 12.05-998 MG TABS Take 1 tablet by mouth in the morning and at bedtime.   ferrous sulfate 325 (65 FE) MG tablet Take 325 mg by mouth daily with breakfast.   furosemide  (LASIX ) 40 MG tablet Take 1 tablet (40 mg total) by mouth daily as needed (For weight gain > 3 lbs overnight.).   levothyroxine  (SYNTHROID , LEVOTHROID) 88 MCG tablet Take 88 mcg by mouth daily before breakfast.   nitroGLYCERIN  (NITROSTAT ) 0.4 MG SL tablet Place 1 tablet (0.4 mg total) under the tongue every 5 (five) minutes as needed for chest pain.   pantoprazole  (PROTONIX ) 40 MG tablet Take 1 tablet (40 mg total) by mouth daily.   prasugrel  (EFFIENT ) 10 MG TABS tablet Take 1 tablet (10 mg  total) by mouth daily.   sacubitril -valsartan  (ENTRESTO ) 49-51 MG Take 1 tablet by mouth 2 (two) times daily.   vitamin B-12 (CYANOCOBALAMIN ) 500 MCG tablet Take 500 mcg by mouth daily.   [DISCONTINUED] enoxaparin  (LOVENOX ) 80 MG/0.8ML injection Inject 0.8 mLs (80 mg total) into the skin every 12 (twelve) hours.   No facility-administered encounter medications on file as of 08/01/2023.    Allergies (verified) Patient has no known allergies.   History: Past Medical History:  Diagnosis Date   Chronic systolic heart failure  (HCC)    CKD (chronic kidney disease)    Coronary artery disease    Diabetes mellitus without complication (HCC)    GERD (gastroesophageal reflux disease)    Glaucoma    Hypercholesteremia    Hypertension    Lesion of lung    patient [facility]   Myocardial infarction (HCC)    x3   Postablative hypothyroidism 03/18/2020   Sleep apnea    unable to tolerate CPAP   Past Surgical History:  Procedure Laterality Date   BOWEL RESECTION     CARDIAC CATHETERIZATION     2 stents in heart   CATARACT EXTRACTION W/PHACO Left 07/01/2013   Procedure: CATARACT EXTRACTION PHACO AND INTRAOCULAR LENS PLACEMENT (IOC);  Surgeon: Oneil T. Roz, MD;  Location: [facility];  Service: Ophthalmology;  Laterality: Left;  CDE 6.84   CORONARY STENT INTERVENTION N/A 04/07/2019   Procedure: CORONARY STENT INTERVENTION;  Surgeon: Burnard Debby LABOR, MD;  Location: [facility]; mRCA 80% -> STENT RESOLUTE ONYX 3.5X22 (3.6 mm)   CORONARY STENT INTERVENTION N/A 06/21/2023   Procedure: CORONARY STENT INTERVENTION;  Surgeon: Mady Bruckner, MD;  Location: [facility];  Service: Cardiovascular;  Laterality: N/A;   CORONARY/GRAFT ACUTE MI REVASCULARIZATION N/A 07/20/2020   Procedure: Coronary/Graft Acute MI Revascularization - Coronary Stent Intervention;  Surgeon: Claudene Victory ORN, MD;  Location: [facility];  Service: Cardiovascular;; proximal RCA 100% occlusion (proximal to previous stent)-3.5 mm 18 mm Onyx Frontier DES (3.75 mm)-overlaps previous stent.   HAND SURGERY Left    LEFT HEART CATH AND CORONARY ANGIOGRAPHY N/A 04/07/2019   Procedure: LEFT HEART CATH AND CORONARY ANGIOGRAPHY;  Surgeon: Burnard Debby LABOR, MD;  [facility];  Service: Cardiovascular;  Laterality: N/A;   LEFT HEART CATH AND CORONARY ANGIOGRAPHY N/A 07/20/2020   Procedure: LEFT HEART CATH AND CORONARY ANGIOGRAPHY;  Surgeon: Claudene Victory ORN, MD;;  Cultprit Lesion: 100% Thrombotic proximal RCA occlusion, mid  RCA stent.  (DES PCI-3.5 mm x18 mm -3.75 mm, overlapping mid RCA stent). tandem 70% & 50% PDA lesions, 50% PL 1.  Collaterals from distal RCA-> LAD. Patent stent in the RI.  20% ostial & imd LAD -> mid LAD severe diffuse disease followed by CTO.   LEFT HEART CATH AND CORONARY ANGIOGRAPHY N/A 06/19/2023   Procedure: LEFT HEART CATH AND CORONARY ANGIOGRAPHY;  Surgeon: Verlin Bruckner BIRCH, MD;  [facility];  Service: Cardiovascular;  Laterality: N/A;   NASAL SEPTUM SURGERY     SHOULDER ARTHROSCOPY Right    TRANSTHORACIC ECHOCARDIOGRAM  07/21/2020   (Post inferior STEMI) moderately reduced LVEF 30 to 35%.  Apical aneurysm with inferior akinesis and inferolateral/inferoseptal/severe HK.  GR 1 DD.  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  Normal RV, unable to assess pressures.  Mild aortic sclerosis but no stenosis.  Elevated RAP/CVP of estimated 15 mmHg with  dilated IVC.   Family History  Problem Relation Age of Onset   Stroke Mother    Heart disease Father    Colon cancer Neg Hx    Social History   Socioeconomic History   Marital status: Married    Spouse name: [name]   Number of children: 1   Years of education: Not on file   Highest education level: High school graduate  Occupational History   Occupation: retired    Comment: [employer]  Tobacco Use   Smoking status: Former    Current packs/day: 0.00    Average packs/day: 2.0 packs/day for 40.0 years (80.0 ttl pk-yrs)    Types: Cigarettes    Start date: 01/10/1968    Quit date: 01/10/2008    Years since quitting: 15.5   Smokeless tobacco: Never  Vaping Use   Vaping status: Never Used  Substance and Sexual Activity   Alcohol use: Yes    Comment: occasional beer   Drug use: No   Sexual activity: Not Currently  Other Topics Concern   Not on file  Social History Narrative   Lives with his wife (who has dementia) - first floor of apartment complex   Social  Drivers of Health   Financial Resource Strain: Low Risk  (08/01/2023)   Overall Financial Resource Strain (CARDIA)    Difficulty of Paying Living Expenses: Not hard at all  Food Insecurity: No Food Insecurity (08/01/2023)   Hunger Vital Sign    Worried About Running Out of Food in the Last Year: Never true    Ran Out of Food in the Last Year: Never true  Transportation Needs: No Transportation Needs (08/01/2023)   PRAPARE - Administrator, Civil Service (Medical): No    Lack of Transportation (Non-Medical): No  Physical Activity: Insufficiently Active (08/01/2023)   Exercise Vital Sign    Days of Exercise per Week: 3 days    Minutes of Exercise per Session: 30 min  Stress: No Stress Concern Present (08/01/2023)   [employer] of Occupational Health - Occupational Stress Questionnaire    Feeling of Stress: Not at all  Social Connections: Moderately Isolated (08/01/2023)   Social Connection and Isolation Panel    Frequency of Communication with Friends and Family: Never    Frequency of Social Gatherings with Friends and Family: Never    Attends Religious Services: More than 4 times per year    Active Member of [employer] or Organizations: No    Attends Engineer, structural: Never    Marital Status: Married    Tobacco Counseling Counseling given: Yes    Clinical Intake:  Pre-visit preparation completed: Yes  Pain : No/denies pain     BMI - recorded: 25.84 Nutritional Status: BMI 25 -29 Overweight Nutritional Risks: None Diabetes: Yes CBG done?: No  Lab Results  Component Value Date   HGBA1C 7.2 (H) 06/20/2023   HGBA1C 7.7 (H) 09/05/2022   HGBA1C 7.6 (H) 05/04/2022     How often do you need to have someone help you when you read instructions, pamphlets, or other written materials from your doctor or pharmacy?: 1 - Never  Interpreter Needed?: No  Information entered by :: alia t/cma   Activities of Daily Living     08/01/2023   10:59 AM  06/19/2023    7:15 PM  In your present state of health, do you have any difficulty performing the following activities:  Hearing? 0 1  Vision? 0 0  Difficulty concentrating or making  decisions? 0 0  Walking or climbing stairs? 0   Dressing or bathing? 0   Doing errands, shopping? 0 0  Preparing Food and eating ? N   Using the Toilet? N   In the past six months, have you accidently leaked urine? N   Do you have problems with loss of bowel control? N   Managing your Medications? N   Managing your Finances? N   Housekeeping or managing your Housekeeping? N     Patient Care Team: [facility] as [facility]CP - General (General Practice) Meng, Hao, [place] as Physician Assistant (Cardiology) Court Dorn PARAS, MD as Consulting Physician (Cardiology)  I have updated your Care Teams any recent Medical Services you may have received from other providers in the past year.     Assessment:   This is a routine wellness examination for [place].  Hearing/Vision screen Hearing Screening - Comments:: Pt denies hearing dif Vision Screening - Comments:: Pt denies vision dif/[facility] [place] [place]/last ov 2023   Goals Addressed             This Visit's Progress    Patient Stated   On track    Hopes to stay healthy and well enough to continue taking care of wife who has dementia       Depression Screen     08/01/2023   11:03 AM 09/05/2022   10:17 AM 07/31/2022   11:32 AM 06/01/2022    9:58 AM 06/01/2022    9:50 AM 05/04/2022    9:57 AM 07/26/2021   11:25 AM  PHQ 2/9 Scores  PHQ - 2 Score 3 1 0 2 0 0 2  PHQ- 9 Score 7 3 0 5  3 4     Fall Risk     08/01/2023   10:49 AM 09/05/2022   10:18 AM 07/31/2022   11:31 AM 06/01/2022    9:50 AM 05/04/2022    9:59 AM  Fall Risk   Falls in the past year? 0 0 0 0 0  Number falls in past yr: 0 0 0  0  Injury with Fall? 0 0 0  0  Risk for fall due to : No Fall Risks No Fall Risks No Fall Risks  No Fall Risks  Follow up Falls evaluation completed  Falls evaluation completed Falls prevention discussed  Falls evaluation completed    MEDICARE RISK AT HOME:  Medicare Risk at Home Any stairs in or around the home?: No If so, are there any without handrails?: No Home free of loose throw rugs in walkways, pet beds, electrical cords, etc?: Yes Adequate lighting in your home to reduce risk of falls?: Yes Life alert?: No Use of a cane, walker or w/c?: No Grab bars in the bathroom?: Yes Shower chair or bench in shower?: Yes Elevated toilet seat or a handicapped toilet?: Yes  TIMED UP AND GO:  Was the test performed?  no  Cognitive Function: 6CIT completed        08/01/2023   11:06 AM 07/31/2022   11:34 AM 07/26/2021   11:28 AM 05/28/2020    1:56 PM  6CIT Screen  What Year? 0 points 0 points 0 points 0 points  What month? 0 points 0 points 0 points 0 points  What time? 0 points 0 points 0 points 0 points  Count back from 20 0 points 0 points 0 points 0 points  Months in reverse 0 points 0 points 0 points 0 points  Repeat phrase  0 points 0 points 2 points 0 points  Total Score 0 points 0 points 2 points 0 points    Immunizations Immunization History  Administered Date(s) Administered   Fluad Quad(high Dose 65+) 09/30/2018   Influenza-Unspecified 10/20/2020   Moderna Covid-19 Fall Seasonal Vaccine 74yrs & older 10/25/2022   Moderna Sars-Covid-2 Vaccination 03/06/2019, 03/31/2019, 08/28/2019, 04/09/2020, 10/07/2020   Pneumococcal Conjugate-13 03/18/2020   Pneumococcal Polysaccharide-23 12/01/2009   Tdap 04/20/2021   Zoster Recombinant(Shingrix ) 09/09/2020, 12/06/2020    Screening Tests Health Maintenance  Topic Date Due   Hepatitis C Screening  Never done   OPHTHALMOLOGY EXAM  05/09/2019   Diabetic kidney evaluation - Urine ACR  05/04/2023   FOOT EXAM  05/04/2023   COVID-19 Vaccine (7 - 2024-25 season) 08/16/2024 (Originally 04/25/2023)   INFLUENZA VACCINE  08/10/2023   HEMOGLOBIN A1C  12/20/2023   Diabetic kidney  evaluation - eGFR measurement  06/22/2024   Medicare Annual Wellness (AWV)  07/31/2024   Pneumococcal Vaccine: 50+ Years (3 of 3 - PCV20 or PCV21) 03/18/2025   Colonoscopy  03/19/2028   DTaP/Tdap/Td (2 - Td or Tdap) 04/21/2031   Zoster Vaccines- Shingrix   Completed   Hepatitis B Vaccines  Aged Out   HPV VACCINES  Aged Out   Meningococcal B Vaccine  Aged Out    Health Maintenance  Health Maintenance Due  Topic Date Due   Hepatitis C Screening  Never done   OPHTHALMOLOGY EXAM  05/09/2019   Diabetic kidney evaluation - Urine ACR  05/04/2023   FOOT EXAM  05/04/2023   Health Maintenance Items Addressed: See Nurse Notes at the end of this note  Additional Screening:  Vision Screening: Recommended annual ophthalmology exams for early detection of glaucoma and other disorders of the eye. Would you like a referral to an eye doctor? No    Dental Screening: Recommended annual dental exams for proper oral hygiene  Community Resource Referral / Chronic Care Management: CRR required this visit?  No   CCM required this visit?  No   Plan:    I have personally reviewed and noted the following in the patient's chart:   Medical and social history Use of alcohol, tobacco or illicit drugs  Current medications and supplements including opioid prescriptions. Patient is not currently taking opioid prescriptions. Functional ability and status Nutritional status Physical activity Advanced directives List of other physicians Hospitalizations, surgeries, and ER visits in previous 12 months Vitals Screenings to include cognitive, depression, and falls Referrals and appointments  In addition, I have reviewed and discussed with patient certain preventive protocols, quality metrics, and best practice recommendations. A written personalized care plan for preventive services as well as general preventive health recommendations were provided to patient.   Ozie Ned, CMA   08/01/2023   After  Visit Summary: (MyChart) Due to this being a telephonic visit, the after visit summary with patients personalized plan was offered to patient via MyChart   Notes: Pt is aware and due for the following: Foot exam, UrineACR.

## 2023-08-01 NOTE — Patient Instructions (Signed)
 [name] , Thank you for taking time out of your busy schedule to complete your Annual Wellness Visit with me. I enjoyed our conversation and look forward to speaking with you again next year. I, as well as your care team,  appreciate your ongoing commitment to your health goals. Please review the following plan we discussed and let me know if I can assist you in the future. Your Game plan/ To Do List    Follow up Visits: Next Medicare AWV with our clinical staff: 08/01/24 at 10:00a.m.   Have you seen your provider in the last 6 months (3 months if uncontrolled diabetes)? Yes Next Office Visit with your provider: n/a  Clinician Recommendations:  Aim for 30 minutes of exercise or brisk walking, 6-8 glasses of water, and 5 servings of fruits and vegetables each day.       This is a list of the screening recommended for you and due dates:  Health Maintenance  Topic Date Due   Hepatitis C Screening  Never done   Eye exam for diabetics  05/09/2019   Yearly kidney health urinalysis for diabetes  05/04/2023   Complete foot exam   05/04/2023   Medicare Annual Wellness Visit  07/31/2023   COVID-19 Vaccine (7 - 2024-25 season) 08/16/2024*   Flu Shot  08/10/2023   Hemoglobin A1C  12/20/2023   Yearly kidney function blood test for diabetes  06/22/2024   Pneumococcal Vaccine for age over 57 (3 of 3 - PCV20 or PCV21) 03/18/2025   Colon Cancer Screening  03/19/2028   DTaP/Tdap/Td vaccine (2 - Td or Tdap) 04/21/2031   Zoster (Shingles) Vaccine  Completed   Hepatitis B Vaccine  Aged Out   HPV Vaccine  Aged Out   Meningitis B Vaccine  Aged Out  *Topic was postponed. The date shown is not the original due date.    Advanced directives:  Advance Care Planning(Declined) Advance directive discussed with you today. Even though you declined this today, please call our office should you change your mind, and we can give you the proper paperwork for you to fill out. is important because it:  [x]  Makes  sure you receive the medical care that is consistent with your values, goals, and preferences  [x]  It provides guidance to your family and loved ones and reduces their decisional burden about whether or not they are making the right decisions based on your wishes.  Follow the link provided in your after visit summary or read over the paperwork we have mailed to you to help you started getting your Advance Directives in place. If you need assistance in completing these, please reach out to us  so that we can help you!  See attachments for Preventive Care and Fall Prevention Tips.

## 2023-08-07 ENCOUNTER — Ambulatory Visit: Admitting: Pharmacist

## 2023-08-16 ENCOUNTER — Ambulatory Visit: Payer: Self-pay | Admitting: Cardiovascular Disease

## 2023-08-16 LAB — HEPATIC FUNCTION PANEL
ALT: 14 IU/L (ref 0–44)
AST: 13 IU/L (ref 0–40)
Albumin: 4.4 g/dL (ref 3.8–4.8)
Alkaline Phosphatase: 82 IU/L (ref 44–121)
Bilirubin Total: 0.5 mg/dL (ref 0.0–1.2)
Bilirubin, Direct: 0.2 mg/dL (ref 0.00–0.40)
Total Protein: 6.9 g/dL (ref 6.0–8.5)

## 2023-08-16 LAB — LIPID PANEL
Chol/HDL Ratio: 3 ratio (ref 0.0–5.0)
Cholesterol, Total: 101 mg/dL (ref 100–199)
HDL: 34 mg/dL — ABNORMAL LOW (ref >39)
LDL Chol Calc (NIH): 42 mg/dL (ref 0–99)
Triglycerides: 141 mg/dL (ref 0–149)
VLDL Cholesterol Cal: 25 mg/dL (ref 5–40)

## 2023-10-01 ENCOUNTER — Telehealth (HOSPITAL_COMMUNITY): Payer: Self-pay | Admitting: Cardiology

## 2023-10-01 ENCOUNTER — Other Ambulatory Visit (HOSPITAL_COMMUNITY): Payer: Self-pay

## 2023-10-01 NOTE — Telephone Encounter (Signed)
 Called to confirm/remind patient of their appointment at the [facility] on 10/01/2023.   Appointment:   [x] Confirmed  [] Left mess   [] No answer/No voice mail  [] VM Full/unable to leave message  [] Phone not in service  Patient reminded to bring all medications and/or complete list.  Confirmed patient has transportation. Gave directions, instructed to utilize valet parking.

## 2023-10-01 NOTE — Progress Notes (Signed)
   AD[facility]ANCED [facility] FAILURE NEW PATIENT CLINIC NOTE  Referring Physician: [facility]  Primary Care: [facility] Primary Cardiologist:  HPI: Gregory Cole is a 73 y.o. male with a PMH of CAD with multiple prior PCI, recent RPDA intervention, HFrEF who presents for initial visit for further evaluation and treatment of heart failure/cardiomyopathy.      Hospitalized in June 2025 for STEMI and underwent distal RCA stenting. Started on medical therapy at that time for reduced EF.      SUBJECTIVE:  Patient overall feels fair, denies any significant lower extremity swelling, orthopnea, PND. Shortness of breath with more than moderate exertion. Denies any recent or active chest pain. He is taking his medications as prescribed. Had not been able to pick up the effient .   PMH, current medications, allergies, social history, and family history reviewed in epic.  PHYSICAL EXAM: Vitals:   10/02/23 1014  BP: 120/70  Pulse: 67  SpO2: 97%   GENERAL: Well nourished and in no apparent distress at rest.  PULM:  Normal work of breathing, clear to auscultation bilaterally. Respirations are unlabored.  CARDIAC:  JVP: flat         Normal rate with regular rhythm. No murmurs, rubs or gallops.  No edema. Warm and well perfused extremities. ABDOMEN: Soft, non-tender, non-distended. NEUROLOGIC: Patient is oriented x3 with no focal or lateralizing neurologic deficits.    DATA REVIEW  ECG: 06/2023: NSR, normal QRS, RAD    ECHO: 06/20/2023: LVEF 30-35, apical aneurysm, low normal RV function  CATH: 06/2023: Chronically occluded LAD, mild Lcx disease, patent proximal and mid RCA stent, distal RCA with severe stenosis s/p PCI   CMR: 06/2023: 4x4cm apical aneurysm, no thrombus, moderately reduced LV systolic function, inferior infract, mildly reduced RV function     ASSESSMENT & PLAN:  Chronic systolic heart failure: Ischemic. - Continue carvedilol  6.25mg  BID, entresto  49/51mg   BID - Takes combo pill jardiance -metformin, continue for HF - NYHA class II, euvolemic today - Continue lasix  40mg  prn - Discussed MRA, would like to hold off - Discussed ICD, will need repeat echo but do not suspect that his EF will improve significantly - Would like to hold off at this time, will think about it  CAD: Recent staged RCA PCI. - No residual angina - Transition to plavix  75mg , had not been taking effient  - Continue aspirin  81mg  daily  HLD: Follows with Dr. Court   Follow up in 3 months  [name], MD [facility] Mechanical Circulatory Support 10/09/23

## 2023-10-02 ENCOUNTER — Ambulatory Visit (HOSPITAL_COMMUNITY)
Admission: RE | Admit: 2023-10-02 | Discharge: 2023-10-02 | Disposition: A | Discharge status: Home / Self Care | Source: Ambulatory Visit | Attending: Cardiology | Admitting: Cardiology

## 2023-10-02 ENCOUNTER — Encounter (HOSPITAL_COMMUNITY): Payer: Self-pay | Admitting: Cardiology

## 2023-10-02 VITALS — BP 120/70 | HR 67 | Ht 67.5 in | Wt 166.0 lb

## 2023-10-02 DIAGNOSIS — I255 Ischemic cardiomyopathy: Secondary | ICD-10-CM

## 2023-10-02 DIAGNOSIS — Z7982 Long term (current) use of aspirin: Secondary | ICD-10-CM | POA: Insufficient documentation

## 2023-10-02 DIAGNOSIS — E785 Hyperlipidemia, unspecified: Secondary | ICD-10-CM | POA: Diagnosis not present

## 2023-10-02 DIAGNOSIS — Z955 Presence of coronary angioplasty implant and graft: Secondary | ICD-10-CM | POA: Insufficient documentation

## 2023-10-02 DIAGNOSIS — I5022 Chronic systolic (congestive) heart failure: Secondary | ICD-10-CM | POA: Insufficient documentation

## 2023-10-02 DIAGNOSIS — I25110 Atherosclerotic heart disease of native coronary artery with unstable angina pectoris: Secondary | ICD-10-CM

## 2023-10-02 DIAGNOSIS — I429 Cardiomyopathy, unspecified: Secondary | ICD-10-CM | POA: Insufficient documentation

## 2023-10-02 DIAGNOSIS — I2510 Atherosclerotic heart disease of native coronary artery without angina pectoris: Secondary | ICD-10-CM | POA: Diagnosis not present

## 2023-10-02 DIAGNOSIS — I252 Old myocardial infarction: Secondary | ICD-10-CM | POA: Diagnosis not present

## 2023-10-02 DIAGNOSIS — E1169 Type 2 diabetes mellitus with other specified complication: Secondary | ICD-10-CM

## 2023-10-02 MED ORDER — CLOPIDOGREL BISULFATE 75 MG PO TABS: 75.0000 mg | ORAL_TABLET | Freq: Every day | ORAL | 11 refills | Status: AC

## 2023-10-02 NOTE — Patient Instructions (Addendum)
 STOP  Effient .  START  Plavix  75 mg daily.  Your physician recommends that you schedule a follow-up appointment in: 3 months   If you have any questions or concerns before your next appointment please send us  a message through [facility] or call our office at [phone].    TO LEAVE A MESSAGE FOR THE NURSE SELECT OPTION 2, PLEASE LEAVE A MESSAGE INCLUDING: YOUR NAME DATE OF BIRTH CALL BACK NUMBER REASON FOR CALL**this is important as we prioritize the call backs  YOU WILL RECEIVE A CALL BACK THE SAME DAY AS LONG AS YOU CALL BEFORE 4:00 PM  At the [facility], you and your health needs are our priority. As part of our continuing mission to provide you with exceptional heart care, we have created designated Provider Care Teams. These Care Teams include your primary Cardiologist (physician) and Advanced Practice Providers (APPs- Physician Assistants and Nurse Practitioners) who all work together to provide you with the care you need, when you need it.   You may see any of the following providers on your designated Care Team at your next follow up: Dr Toribio Fuel Dr Ezra Shuck Dr. Ria Commander Dr. [name] Amy Lenetta, NP Caffie Shed, [place] [facility] [place], [place] Beckey Coe, NP [place] Lee, NP Ellouise Class, NP Tinnie Redman, PharmD Jaun Bash, PharmD   Please be sure to bring in all your medications bottles to every appointment.    Thank you for choosing [facility]

## 2023-10-23 ENCOUNTER — Encounter: Payer: Self-pay | Admitting: *Deleted

## 2023-10-24 ENCOUNTER — Ambulatory Visit: Admitting: Emergency Medicine

## 2023-10-25 ENCOUNTER — Other Ambulatory Visit (HOSPITAL_COMMUNITY): Payer: Self-pay

## 2023-10-25 ENCOUNTER — Encounter: Payer: Self-pay | Admitting: Physician Assistant

## 2023-10-25 ENCOUNTER — Ambulatory Visit: Attending: Physician Assistant | Admitting: Physician Assistant

## 2023-10-25 VITALS — BP 128/76 | HR 64 | Ht 67.5 in | Wt 165.0 lb

## 2023-10-25 DIAGNOSIS — I255 Ischemic cardiomyopathy: Secondary | ICD-10-CM | POA: Diagnosis not present

## 2023-10-25 DIAGNOSIS — E119 Type 2 diabetes mellitus without complications: Secondary | ICD-10-CM | POA: Diagnosis not present

## 2023-10-25 DIAGNOSIS — I2510 Atherosclerotic heart disease of native coronary artery without angina pectoris: Secondary | ICD-10-CM

## 2023-10-25 DIAGNOSIS — I152 Hypertension secondary to endocrine disorders: Secondary | ICD-10-CM | POA: Diagnosis not present

## 2023-10-25 DIAGNOSIS — E1159 Type 2 diabetes mellitus with other circulatory complications: Secondary | ICD-10-CM

## 2023-10-25 DIAGNOSIS — E785 Hyperlipidemia, unspecified: Secondary | ICD-10-CM

## 2023-10-25 MED ORDER — SACUBITRIL-VALSARTAN 49-51 MG PO TABS: 1.0000 | ORAL_TABLET | Freq: Two times a day (BID) | ORAL | 11 refills | Status: AC

## 2023-10-25 MED ORDER — SACUBITRIL-VALSARTAN 49-51 MG PO TABS
1.0000 | ORAL_TABLET | Freq: Two times a day (BID) | ORAL | 11 refills | Status: DC
Start: 2023-10-25 — End: 2023-10-25
  Filled 2023-10-25: qty 60, 30d supply, fill #0

## 2023-10-25 NOTE — Progress Notes (Signed)
 Cardiology Office Note   Date:  10/26/2023  ID:  DJON TITH, DOB [birth year], MRN [id number] PCP: [facility] Providers Cardiologist:  Dorn Lesches, MD Cardiology APP:  Janene Boer, [place]     History of Present Illness Gregory Cole is a 73 y.o. male with a hx of CAD, ICM, HTN, HLD and DM II. He had PCI x3 in 2010.  Patient was admitted on 04/05/2019 with NSTEMI.  High-sensitivity troponin peaked at 1079. Echo performed on 04/06/2019 showed EF 50%, akinetic and aneurysmal LV apex with no LV thrombus.  Cardiac catheterization performed on 04/07/2019 showed multivessel disease with 35% proximal RCA, 70% followed by 50% RPDA lesion, 100% mid to distal LAD occlusion, 20% proximal and mid LAD, 80% mid RCA lesion treated with DES, widely patent ramus stent.  Since the distal LAD was being fed by faint bridging collaterals from the RCA, stenting of the mid RCA lesion resulted in improved collateralization to the distal LAD. He had a inferior STEMI in July 2022. There was some delay in transport as the ambulance had a flat tire and he needed to wait for another ambulance to arrive.  He was taken emergently to the Cath Lab upon arrival at [facility] and was found to have proximal RCA occlusion of stented with a 3.5 x 18 mm resolute DES.  The stent overlapped with the previously placed RCA stent that was widely patent.  He also had patent stent in the large ramus branch.  Mid LAD was known to be occluded.  EF reduced to 35 to 40%.  Subsequent MRI showed mildly reduced LVEF of 43% with probable nonviable apex was possible some viability in the inferior wall.  He was started on guideline directed medical therapy including Entresto , Farxiga , carvedilol  and as needed dose of Lasix .  He was last seen by Dr. Burnard in the office in July 2022.  More recently, he was readmitted to the hospital on 06/19/2023 with inferior STEMI secondary to proximal RCA occlusion.  Emergent cardiac catheterization  performed on the same day showed chronic LAD occlusion, 99% RPDA lesion, significant RPAV lesion, 80% distal RCA lesion.  Due to complex nature of underlying coronary artery disease, the case was reviewed with the interventional team.  He ultimately came back to the Cath Lab on 06/21/2023 and underwent bifurcation PCI of distal RCA extending into the RPDA and RPAV.  Echocardiogram obtained on 06/20/2023 showed EF 30 to 35%, contrast swirling in the apex concerning for possible thrombus, grade 1 DD, wall motion abnormality, trivial MR.  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  He was ultimately discharged home on aspirin  and prasugrel .  Plan to repeat echocardiogram in 3 months, if EF does not improve, will consider ICD for primary prevention.  He has since been established with Dr. Zenaida of heart failure service and restarted on Entresto .  Patient presents today for follow-up.  He has not been taking Entresto  as he ran out of the prescription.  I will refill his Entresto  at 49-51 mg twice a day.  He has been compliant with carvedilol  and empagliflozin -metformin.  Once he get back on the Entresto , I will recommend wait roughly 3 months before we  repeat echocardiogram.  I recommended complete echocardiogram with Definity  contrast near the end of January 2026.  He can follow-up with Dr. Court in 4 months.  He has a follow-up with Dr. Zenaida of heart failure service in December.    ROS:   He denies chest pain, palpitations, dyspnea, pnd, orthopnea, n, v, dizziness, syncope, edema, weight gain, or early satiety. All other systems reviewed and are otherwise negative except as noted above.     Studies Reviewed      Cardiac Studies & Procedures   ______________________________________________________________________________________________ CARDIAC CATHETERIZATION  CARDIAC CATHETERIZATION 06/21/2023  Conclusion Conclusions: Severe distal RCA bifurcation stenoses ([place] 1, 1, 1), as seen on diagnostic catheterization earlier this week. Widely patent proximal-mid RCA stent. Successful bifurcation PCI of distal RCA extending into the RPDA and RPAV using Culotte technique with 0% residual stenosis and TIMI-3 flow.  Recommendations: Dual antiplatelet therapy with aspirin  and prasugrel  for at least 12 months (may need to transition to clopidogrel  when the patient turns 73 years old later this year). Resume heparin  infusion 2 hours after TR band removal given concern for LV apical thrombus pending cardiac MRI. Continue aggressive secondary prevention of coronary artery disease and optimization of goal-directed medical therapy for chronic HFrEF.  Gregory Hanson, MD [facility]  Findings Coronary Findings Diagnostic  Dominance: Right  Right Coronary Artery Vessel is large. Vessel is angiographically normal. Previously placed Prox RCA to Mid RCA stent of unknown type is  widely patent. Dist RCA lesion is 80% stenosed.  Right Posterior Descending Artery RPDA-1 lesion is 99% stenosed. RPDA-2 lesion is 50% stenosed.  Right Posterior Atrioventricular Artery RPAV lesion is 99% stenosed.  Second Right Posterolateral Branch 2nd RPL lesion is 50% stenosed.  Intervention  Dist RCA lesion Culotte (Also treats lesions: RPDA-1) Performed a successful Culotte technique in the main branch at the Dist RCA and in the side branch at the RPDA. Pre-stent angioplasty was performed. A stent was successfully placed in the main branch and side branch. POT was performed. A STENT SYNERGY XD [id number] stent was successfully placed in the main branch. Post-stent kissing balloon dilatation  was performed in the main branch and side branch. Post-Intervention Lesion Assessment The intervention was successful. Pre-interventional TIMI flow is 3. Post-intervention TIMI flow is 3. No complications occurred at this lesion. There is a 0% residual stenosis post intervention.  RPDA-1 lesion Culotte (Also treats lesions: Dist RCA) Performed a successful Culotte technique in the main branch at the Dist RCA and in the side branch at the RPDA. Pre-stent angioplasty was performed. A stent was successfully placed in the main branch and side branch. POT was performed. A STENT SYNERGY XD [id number] stent was successfully placed in the main branch. Post-stent kissing balloon dilatation was performed in the main branch and side branch. Post-Intervention Lesion Assessment The intervention was successful. Pre-interventional TIMI flow is 3. Post-intervention TIMI flow is 3. No complications occurred at this lesion. There is a 0% residual stenosis post intervention.  RPAV lesion Stent A drug-eluting stent was successfully placed using a STENT SYNERGY XD [id number]. Post-Intervention Lesion Assessment The intervention was successful. Pre-interventional TIMI flow is 3. Post-intervention TIMI flow is 3. No complications occurred at this lesion. There is a 0% residual stenosis post intervention.   CARDIAC CATHETERIZATION 06/19/2023  Conclusion   Mid LAD to Dist LAD lesion is 100% stenosed.   Mid LAD lesion is 20% stenosed.   RPDA-2 lesion is 50% stenosed.   2nd RPL lesion is 50% stenosed.   RPDA-1  lesion is 99% stenosed.   RPAV lesion is 99% stenosed.   Ost Cx to Prox Cx lesion is 30% stenosed.   Ramus lesion is 20% stenosed.   Prox LAD lesion is 80% stenosed.   Dist RCA lesion is 80% stenosed.  Chronic LAD occlusion. The distal LAD fills from left to left and right to left collaterals The Circumflex has mild plaque. The intermediate branch stent is patent The RCA is a dominant vessel with patent  proximal and mid stents. The distal RCA has a severe stenosis. The PDA has a severe ostial stenosis. The posterolateral artery has a severe ostial stenosis. 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 LVEDP 19 mmHg  Recommendations: He is chest pain free. He has complex disease in the distal RCA involving both the large Posterolateral artery and the large PDA. He has chronic occlusion of the mid LAD. Will review films with the IC team tomorrow. Consider complex PCI vs CABG. Will start IV heparin  and IV NTG tonight. High intensity statin. Continue ASA. Hold PLavix . Echo tomorrow.  Findings Coronary Findings Diagnostic  Dominance: Right  Left Anterior Descending Vessel is angiographically normal. Collaterals Dist LAD filled by collaterals from 3rd RPL.  Collaterals Dist LAD filled by collaterals from 2nd RPL.  Collaterals Dist LAD filled by collaterals from 2nd Sept.  Prox LAD lesion is 80% stenosed. Mid LAD lesion is 20% stenosed. Mid LAD to Dist LAD lesion is 100% stenosed.  Ramus Intermedius Ramus lesion is 20% stenosed. The lesion was previously treated using a drug eluting stent over 2 years ago.  Left Circumflex Ost Cx to Prox Cx lesion is 30% stenosed.  Right Coronary Artery Vessel is large. Vessel is angiographically normal. Dist RCA lesion is 80% stenosed.  Right Posterior Descending Artery RPDA-1 lesion is 99% stenosed. RPDA-2 lesion is 50% stenosed.  Right Posterior Atrioventricular Artery RPAV lesion is 99% stenosed.  Second Right Posterolateral Branch 2nd RPL lesion is 50% stenosed.  Intervention  No interventions have been documented.     ECHOCARDIOGRAM  ECHOCARDIOGRAM COMPLETE 06/20/2023  Narrative ECHOCARDIOGRAM REPORT    Patient Name:   Gregory Cole Idler Date of Exam: 06/20/2023 Medical Rec #:  [id number]       Height:       67.0 in Accession #:    [id number]      Weight:       185.2 lb Date of Birth:  1950-01-16      BSA:          1.957  m Patient Age:    72 years        BP:           88/61 mmHg Patient Gender: M               HR:           57 bpm. Exam Location:  Inpatient  Procedure: 2D Echo, Cardiac Doppler, Color Doppler and Intracardiac Opacification Agent (Both Spectral and Color Flow Doppler were utilized during procedure).  Indications:    I25.110 Atherosclerotic heart disease of native coronary artery with unstable angina pectoris  History:        Patient has prior history of Echocardiogram examinations. CAD and Acute MI; Risk Factors:Current Smoker and Hypertension.  Sonographer:    Ellouise Mose RDCS Referring Phys: [id number] CHRISTOPHER D [facility]   Sonographer Comments: Technically difficult study due to poor echo windows. IMPRESSIONS   1. Echo contrast swirl in apex concerning for possible thrombus; this appears similar  to contrast images from 2022, when thrombus was then excluded by MRI. Left ventricular ejection fraction, by estimation, is 30 to 35%. The left ventricle has moderately decreased function. The left ventricle demonstrates regional wall motion abnormalities (see scoring diagram/findings for description). Left ventricular diastolic parameters are consistent with Grade I diastolic dysfunction (impaired relaxation). 2. Right ventricular systolic function is low normal. The right ventricular size is mildly enlarged. Tricuspid regurgitation signal is inadequate for assessing PA pressure. 3. The mitral valve is normal in structure. Trivial mitral valve regurgitation. No evidence of mitral stenosis. 4. The aortic valve was not well visualized. Aortic valve regurgitation is not visualized. No aortic stenosis is present. 5. The inferior vena cava is dilated in size with <50% respiratory variability, suggesting right atrial pressure of 15 mmHg.  Comparison(s): No significant change from prior study. Prior images reviewed side by side.  Conclusion(s)/Recommendation(s): Overall similar to images from 2022; LV  apical aneurysm present previously. Both images from 2022 and this study show significant swirling at apex, concerning for possible LV thrombus. Cardiac MRI excluded thrombus in 2022; while aneursym is not new, would consider cardiac MRI for definitive evaluation if there is no other indication for long term anticoagulation.  FINDINGS Left Ventricle: Echo contrast swirl in apex concerning for possible thrombus; this appears similar to contrast images from 2022, when thrombus was then excluded by MRI. Left ventricular ejection fraction, by estimation, is 30 to 35%. The left ventricle has moderately decreased function. The left ventricle demonstrates regional wall motion abnormalities. Definity  contrast agent was given IV to delineate the left ventricular endocardial borders. The left ventricular internal cavity size was normal in size. There is no left ventricular hypertrophy. Left ventricular diastolic parameters are consistent with Grade I diastolic dysfunction (impaired relaxation).   LV Wall Scoring: The entire apex is aneurysmal. The inferior wall, posterior wall, and mid inferoseptal segment are akinetic. The mid anteroseptal segment, mid anterolateral segment, mid anterior segment, and basal inferoseptal segment are hypokinetic. The basal anteroseptal segment, basal anterolateral segment, and basal anterior segment are normal.  Right Ventricle: The right ventricular size is mildly enlarged. No increase in right ventricular wall thickness. Right ventricular systolic function is low normal. Tricuspid regurgitation signal is inadequate for assessing PA pressure.  Left Atrium: Left atrial size was normal in size.  Right Atrium: Right atrial size was normal in size.  Pericardium: There is no evidence of pericardial effusion.  Mitral Valve: The mitral valve is normal in structure. Trivial mitral valve regurgitation. No evidence of mitral valve stenosis.  Tricuspid Valve: The tricuspid valve  is normal in structure. Tricuspid valve regurgitation is not demonstrated. No evidence of tricuspid stenosis.  Aortic Valve: The aortic valve was not well visualized. Aortic valve regurgitation is not visualized. No aortic stenosis is present.  Pulmonic Valve: The pulmonic valve was not well visualized. Pulmonic valve regurgitation is not visualized. No evidence of pulmonic stenosis.  Aorta: The aortic root and ascending aorta are structurally normal, with no evidence of dilitation.  Venous: The inferior vena cava is dilated in size with less than 50% respiratory variability, suggesting right atrial pressure of 15 mmHg.  IAS/Shunts: No atrial level shunt detected by color flow Doppler.   LEFT VENTRICLE PLAX 2D LVIDd:         5.10 cm      Diastology LVIDs:         4.30 cm      LV e' medial:    3.48 cm/s LV PW:  0.90 cm      LV E/e' medial:  13.1 LV IVS:        0.80 cm      LV e' lateral:   7.40 cm/s LVOT diam:     2.20 cm      LV E/e' lateral: 6.2 LV SV:         71 LV SV Index:   37 LVOT Area:     3.80 cm  LV Volumes (MOD) LV vol d, MOD A2C: 96.6 ml LV vol d, MOD A4C: 128.0 ml LV vol s, MOD A2C: 55.2 ml LV vol s, MOD A4C: 90.4 ml LV SV MOD A2C:     41.4 ml LV SV MOD A4C:     128.0 ml LV SV MOD BP:      42.2 ml  RIGHT VENTRICLE            IVC RV S prime:     9.03 cm/s  IVC diam: 2.40 cm TAPSE (M-mode): 1.8 cm  LEFT ATRIUM           Index        RIGHT ATRIUM           Index LA diam:      3.30 cm 1.69 cm/m   RA Area:     15.80 cm LA Vol (A2C): 27.0 ml 13.80 ml/m  RA Volume:   42.40 ml  21.66 ml/m LA Vol (A4C): 33.9 ml 17.32 ml/m AORTIC VALVE LVOT Vmax:   87.50 cm/s LVOT Vmean:  56.100 cm/s LVOT VTI:    0.188 m  AORTA Ao Root diam: 3.90 cm Ao Asc diam:  3.20 cm  MITRAL VALVE MV Area (PHT): 2.48 cm    SHUNTS MV Decel Time: 306 msec    Systemic VTI:  0.19 m MV E velocity: 45.60 cm/s  Systemic Diam: 2.20 cm MV A velocity: 87.50 cm/s MV E/A ratio:   0.52  Gregory Bruckner MD Electronically signed by Gregory Bruckner MD Signature Date/Time: 06/20/2023/7:31:59 PM    Final        CARDIAC MRI  MR CARDIAC MORPHOLOGY W WO CONTRAST 06/22/2023  Narrative CLINICAL DATA:  Cardiac thrombus or embolic source suspected possible LV thrombus  COMPARISON: Echocardiogram 06/20/23  EXAM: MR CARDIA MORPHOLOGY WITHOUT AND WITH CONTRAST; MR CARDIAC VELOCITY FLOW MAPPING  TECHNIQUE: The patient was scanned on a 1.5 Tesla Siemens magnet. A dedicated cardiac coil was used. Functional imaging was done using TrueFisp sequences. 2,3, and 4 chamber views were done to assess for RWMA's. Modified Simpson's rule using a short axis stack was used to calculate an ejection fraction on a dedicated work [employer]. The patient received 10mL GADAVIST  GADOBUTROL  1 MMOL/ML IV SOLN. After 10 minutes inversion recovery sequences were used to assess for infiltration and scar tissue. Phase contrast velocity encoded images obtained x 2.  This examination is tailored for evaluation cardiac anatomy and function and provides very limited assessment of noncardiac structures, which are accordingly not evaluated during interpretation. If there is clinical concern for extracardiac pathology, further evaluation with CT imaging should be considered.  FINDINGS: LEFT VENTRICLE:  Left ventricular chamber size: Normal.  Left ventricular wall thickness: Mildly increased. Maximal wall thickness 13 mm.  Left ventricular systolic function: Moderately reduced  LVEF = 34%  Apical akinesis. There is no discrete LV apical thrombus on long TI images. Slow flow noted at apex.  No myocardial edema  Abnormal first pass perfusion at apex.  There is post contrast  delayed myocardial enhancement: Inferior wall LGE which is transmural at the base and mid. At the mid to apex, the LGE in the inferior wall is 50% of the myocardial thickness. With  7 days of MI, LGE may be overestimated for the assessment of viability. There is circumferential LGE with no viability at the apex (>50% myocardial thickness), extending into an LV apical aneurysm with transmural LGE.  Normal T1 myocardial nulling kinetics suggest against a diagnosis of cardiac amyloidosis.  ECV = 30% in areas of normal myocardium.  RIGHT VENTRICLE:  Normal right ventricular chamber size.  Normal right ventricular wall thickness.  Mildly reduced right ventricular systolic function.  RVEF = 49%  Mild hypokinesis.  No post contrast delayed myocardial enhancement.  ATRIA:  Left atrium: Mildly dilated  Right atrium: normal  PERICARDIUM:  Normal pericardium.  Trivial pericardial effusion.  OTHER: No significant extracardiac findings.  MEASUREMENTS: Qp/Qs: 1.2  VALVES:  Tricuspid aortic valve.  Aortic valve regurgitation: Mild, regurgitant fraction 8%  Pulmonary valve regurgitation: Trivial, regurgitant fraction 3%  Mitral valve regurgitation: Trivial, regurgitant fraction 7%  Tricuspid valve regurgitation: Trivial, regurgitant fraction 2%  Left ventricle:  LV male  LV EF: 34% (normal 49-79%)  Absolute volumes:  LV EDV: (Normal 95-215 mL)  LV ESV: 78mL (Normal 25-85 mL)  LV SV: 40mL (Normal 61-145 mL)  CO: 2.3L/min (Normal 3.4-7.8 L/min)  Indexed volumes:  CI: 1.15L/min/sq-m (Normal 1.8-4.2 L/min/sq-m)  LV EDV: 90mL/sq-m (Normal 50-108 mL/sq-m)  LV ESV: 73mL/sq-m (Normal 11-47 mL/sq-m)  LV SV: 63mL/sq-m (Normal 33-72 mL/sq-m)  Right ventricle:  RV male  RV EF:  49% (Normal 51-80%)  Absolute volumes:  RV EDV: 91mL (Normal 109-217 mL)  RV ESV: 47mL (Normal 23-91 mL)  RV SV: 45mL (Normal 71-141 mL)  CO: 2.6L/min (Normal 2.8-8.8 L/min)  Indexed volumes:  CI: 1.28L/min/sq-m (Normal 1.7-4.2 L/min/sq-m)  RV EDV: 51mL/sq-m (Normal 58-109 mL/sq-m)  RV ESV: 21mL/sq-m (Normal 12-46 mL/sq-m)  RV SV: 16mL/sq-m  (Normal 38-71 mL/sq-m)  IMPRESSION: 1. 4 x 4 cm LV apical aneurysm with akinesis and transmural delayed myocardial enhancement. There is no discrete LV apical thrombus. Slow flow noted at apex on cine images.  2. Moderately reduced LV systolic function with grossly normal LV chamber size. LVEF 34%. Inferior wall delayed myocardial enhancement consistent with inferior infarct.  3. Mildly reduced RV systolic function with normal RV chamber size. RVEF 49%.   Electronically Signed By: Soyla Merck M.D. On: 06/22/2023 16:49   ______________________________________________________________________________________________      Risk Assessment/Calculations           Physical Exam VS:  BP 128/76   Pulse 64   Ht 5' 7.5 (1.715 m)   Wt 165 lb (74.8 kg)   SpO2 95%   BMI 25.46 kg/m        Wt Readings from Last 3 Encounters:  10/25/23 165 lb (74.8 kg)  10/02/23 166 lb (75.3 kg)  08/01/23 165 lb (74.8 kg)    GEN: Well nourished, well developed in no acute distress NECK: No JVD; No carotid bruits CARDIAC: RRR, no murmurs, rubs, gallops RESPIRATORY:  Clear to auscultation without rales, wheezing or rhonchi  ABDOMEN: Soft, non-tender, non-distended EXTREMITIES:  No edema; No deformity   ASSESSMENT AND PLAN  Ischemic cardiomyopathy: Patient has ran out of Entresto , will refill Entresto .  Ejection fraction was 30 to 35% after recent inferior STEMI.  Continue carvedilol  and Jardiance .  Plan to repeat echocardiogram in 3 months, if EF remain low, will consider ICD.  CAD: Recurrent inferior STEMI.  Patient underwent complex PCI of distal RCA in June 2025.  Hypertension: Blood pressure well-controlled  Hyperlipidemia: Continue atorvastatin  80 mg daily, LDL goal less than 55  DM2: Managed by primary care provider.  On combination of Jardiance -metformin        Dispo: Follow-up with Dr. Court in 4 months after his echocardiogram.  Keep follow-up with Dr. Zenaida in  December.  Signed, Scot Ford, PA

## 2023-10-25 NOTE — Patient Instructions (Signed)
 Thank you for choosing [facility]!     Medication Instructions:  Your Entresto  49-51mg  has been printed for you today. *If you need a refill on your cardiac medications before your next appointment, please call your pharmacy*   Lab Work: No labs were ordered during today's visit.  If you have labs (blood work) drawn today and your tests are completely normal, you will receive your results only by: MyChart Message (if you have MyChart) OR A paper copy in the mail If you have any lab test that is abnormal or we need to change your treatment, we will call you to review the results.   Testing/Procedures: Your physician has requested that you have an echocardiogram in late January 2026. Echocardiography is a painless test that uses sound waves to create images of your heart. It provides your doctor with information about the size and shape of your heart and how well your heart's chambers and valves are working. This procedure takes approximately one hour. There are no restrictions for this procedure. Please do NOT wear cologne, perfume, aftershave, or lotions (deodorant is allowed). Please arrive 15 minutes prior to your appointment time.  Please note: We ask at that you not bring children with you during ultrasound (echo/ vascular) testing. Due to room size and safety concerns, children are not allowed in the ultrasound rooms during exams. Our front office staff cannot provide observation of children in our lobby area while testing is being conducted. An adult accompanying a patient to their appointment will only be allowed in the ultrasound room at the discretion of the ultrasound technician under special circumstances. We apologize for any inconvenience.   Your next appointment:   4 month(s)   Provider:   Dorn Lesches, MD     Follow-Up: At [facility], you and your health needs are our priority.  As part of our continuing mission to provide you with exceptional  heart care, we have created designated Provider Care Teams.  These Care Teams include your primary Cardiologist (physician) and Advanced Practice Providers (APPs -  Physician Assistants and Nurse Practitioners) who all work together to provide you with the care you need, when you need it. We recommend signing up for the patient portal called MyChart.  Sign up information is provided on this After Visit Summary.  MyChart is used to connect with patients for Virtual Visits (Telemedicine).  Patients are able to view lab/test results, encounter notes, upcoming appointments, etc.  Non-urgent messages can be sent to your provider as well.   To learn more about what you can do with MyChart, go to [email or web address].

## 2023-12-25 ENCOUNTER — Encounter (HOSPITAL_COMMUNITY): Admitting: Cardiology

## 2024-01-21 ENCOUNTER — Ambulatory Visit (HOSPITAL_COMMUNITY): Admitting: Cardiology

## 2024-01-29 ENCOUNTER — Ambulatory Visit: Payer: Self-pay | Admitting: Physician Assistant

## 2024-01-29 ENCOUNTER — Ambulatory Visit (HOSPITAL_COMMUNITY)
Admission: RE | Admit: 2024-01-29 | Discharge: 2024-01-29 | Disposition: A | Discharge status: Home / Self Care | Source: Ambulatory Visit | Attending: Cardiology | Admitting: Cardiology

## 2024-01-29 DIAGNOSIS — I255 Ischemic cardiomyopathy: Secondary | ICD-10-CM | POA: Insufficient documentation

## 2024-01-29 LAB — ECHOCARDIOGRAM COMPLETE
Area-P 1/2: 1.93 cm2
S' Lateral: 3.5 cm

## 2024-01-29 MED ORDER — PERFLUTREN LIPID MICROSPHERE
1.0000 mL | INTRAVENOUS | Status: AC | PRN
  Administered 2024-01-29: 2 mL via INTRAVENOUS

## 2024-02-08 ENCOUNTER — Telehealth (HOSPITAL_COMMUNITY): Payer: Self-pay | Admitting: Cardiology

## 2024-02-08 NOTE — Telephone Encounter (Signed)
 Called to confirm/remind patient of their appointment at the [facility] on 02/08/2024.   Appointment:   [x] Confirmed  [] Left mess   [] No answer/No voice mail  [] VM Full/unable to leave message  [] Phone not in service  Patient reminded to bring all medications and/or complete list.  Confirmed patient has transportation. Gave directions, instructed to utilize valet parking.

## 2024-02-11 ENCOUNTER — Ambulatory Visit (HOSPITAL_COMMUNITY): Admitting: Cardiology

## 2024-02-14 ENCOUNTER — Telehealth (HOSPITAL_COMMUNITY): Payer: Self-pay | Admitting: Cardiology

## 2024-02-14 NOTE — Telephone Encounter (Signed)
 Called to confirm/remind patient of their appointment at the [facility] on 02/14/24.   Appointment:   [x] Confirmed  [] Left mess   [] No answer/No voice mail  [] VM Full/unable to leave message  [] Phone not in service  Patient reminded to bring all medications and/or complete list.  Confirmed patient has transportation. Gave directions, instructed to utilize valet parking.

## 2024-02-15 ENCOUNTER — Ambulatory Visit (HOSPITAL_COMMUNITY): Admission: RE | Admit: 2024-02-15 | Admitting: Cardiology

## 2024-02-15 VITALS — BP 104/56 | HR 58 | Wt 172.2 lb

## 2024-02-15 DIAGNOSIS — R079 Chest pain, unspecified: Secondary | ICD-10-CM

## 2024-02-15 NOTE — Patient Instructions (Signed)
 There has been no changes to your medications.  Your physician recommends that you schedule a follow-up appointment in: 5 months ( July) ** PLEASE CALL THE OFFICE IN MAY TO ARRANGE YOUR FOLLOW UP APPOINTMENT.**  If you have any questions or concerns before your next appointment please send us  a message through [place] or call our office at [phone].    TO LEAVE A MESSAGE FOR THE NURSE SELECT OPTION 2, PLEASE LEAVE A MESSAGE INCLUDING: YOUR NAME DATE OF BIRTH CALL BACK NUMBER REASON FOR CALL**this is important as we prioritize the call backs  YOU WILL RECEIVE A CALL BACK THE SAME DAY AS LONG AS YOU CALL BEFORE 4:00 PM  At the [facility], you and your health needs are our priority. As part of our continuing mission to provide you with exceptional heart care, we have created designated Provider Care Teams. These Care Teams include your primary Cardiologist (physician) and Advanced Practice Providers (APPs- Physician Assistants and Nurse Practitioners) who all work together to provide you with the care you need, when you need it.   You may see any of the following providers on your designated Care Team at your next follow up: Dr Toribio Fuel Dr Ezra Shuck Dr. [name] Greig Mosses, NP Caffie Shed, [place] [facility] [place], [place] Beckey Coe, NP Jordan Lee, NP Ellouise Class, NP Tinnie Redman, PharmD Jaun Bash, PharmD   Please be sure to bring in all your medications bottles to every appointment.    Thank you for choosing [facility]

## 2024-02-15 NOTE — Progress Notes (Incomplete)
" ° °  ADVANCED HEART FAILURE FOLLOW UP CLINIC NOTE  Referring Physician: [facility]  Primary Care: [facility] Primary Cardiologist:  HPI: Gregory Cole is a 74 y.o. male with a PMH of CAD with multiple prior PCI, recent RPDA intervention, HFrEF who presents for initial visit for further evaluation and treatment of heart failure/cardiomyopathy.      He had PCI x3 in 2010.   Hospitalized in June 2025 for STEMI and underwent distal RCA stenting. Started on medical therapy at that time for reduced EF. While admitted underwent CMR for contrast swirling in the apex that showed a 4x4 cm apical aneurysm, inferior wall with LGE, discrete      SUBJECTIVE:  Patient overall feels fair, denies any significant lower extremity swelling, orthopnea, PND. Shortness of breath with more than moderate exertion. Denies any recent or active chest pain. He is taking his medications as prescribed. Had not been able to pick up the effient .   PMH, current medications, allergies, social history, and family history reviewed in epic.  PHYSICAL EXAM: Vitals:   02/15/24 1430  BP: (!) 104/56  Pulse: (!) 58  SpO2: 95%    GENERAL: NAD, fair appearing PULM:  Normal work of breathing, CTAB CARDIAC:  JVP: flat         Normal rate with regular rhythm. No murmurs, rubs or gallops.  No edema. Warm and well perfused extremities. ABDOMEN: Soft, non-tender, non-distended. NEUROLOGIC: Patient is oriented x3 with no focal or lateralizing neurologic deficits.     DATA REVIEW  ECG: 06/2023: NSR, normal QRS, RAD    ECHO: 06/20/2023: LVEF 30-35, apical aneurysm, low normal RV function 1/20206: Aneurysmal apex and distal walls, contrast swirling but no clear clot  CATH: 06/2023: Chronically occluded LAD, mild Lcx disease, patent proximal and mid RCA stent, distal RCA with severe stenosis s/p PCI   CMR: 06/2023: 4x4cm apical aneurysm, no thrombus, moderately reduced LV systolic function, inferior  infract, mildly reduced RV function     ASSESSMENT & PLAN:  Chronic systolic heart failure: Ischemic. - Continue carvedilol  6.25mg  BID, entresto  49/51mg  BID - Takes combo pill jardiance -metformin, continue for HF - NYHA class II, euvolemic today - Continue lasix  40mg  prn - Discussed MRA, would like to hold off - Discussed ICD, will need repeat echo but do not suspect that his EF will improve significantly - Would like to hold off at this time, will think about it  CAD: Recent staged RCA PCI. - No residual angina - Transition to plavix  75mg , had not been taking effient  - Continue aspirin  81mg  daily  HLD: Follows with Dr. Court   Follow up in 3 months  [name], MD [facility] Mechanical Circulatory Support 02/15/24 "

## 2024-03-03 ENCOUNTER — Ambulatory Visit: Admitting: Cardiovascular Disease

## 2024-08-01 ENCOUNTER — Ambulatory Visit: Payer: Self-pay
